# Patient Record
Sex: Female | Born: 1985 | Race: Black or African American | Hispanic: No | Marital: Married | Smoking: Light tobacco smoker
Health system: Southern US, Community
[De-identification: ages and names within clinical notes are randomized; demographics above are authoritative.]

## PROBLEM LIST (undated history)

## (undated) ENCOUNTER — Inpatient Hospital Stay (HOSPITAL_COMMUNITY): Payer: Self-pay

## (undated) ENCOUNTER — Inpatient Hospital Stay: Payer: Self-pay

## (undated) ENCOUNTER — Emergency Department (HOSPITAL_COMMUNITY): Admission: EM | Payer: PRIVATE HEALTH INSURANCE | Source: Home / Self Care

## (undated) DIAGNOSIS — O09299 Supervision of pregnancy with other poor reproductive or obstetric history, unspecified trimester: Secondary | ICD-10-CM

## (undated) DIAGNOSIS — L732 Hidradenitis suppurativa: Secondary | ICD-10-CM

## (undated) DIAGNOSIS — Z87442 Personal history of urinary calculi: Secondary | ICD-10-CM

## (undated) DIAGNOSIS — S92909A Unspecified fracture of unspecified foot, initial encounter for closed fracture: Secondary | ICD-10-CM

## (undated) DIAGNOSIS — N390 Urinary tract infection, site not specified: Secondary | ICD-10-CM

## (undated) DIAGNOSIS — Z9289 Personal history of other medical treatment: Secondary | ICD-10-CM

## (undated) DIAGNOSIS — I10 Essential (primary) hypertension: Secondary | ICD-10-CM

## (undated) DIAGNOSIS — F32A Depression, unspecified: Secondary | ICD-10-CM

## (undated) DIAGNOSIS — F329 Major depressive disorder, single episode, unspecified: Secondary | ICD-10-CM

## (undated) DIAGNOSIS — R55 Syncope and collapse: Secondary | ICD-10-CM

## (undated) DIAGNOSIS — D649 Anemia, unspecified: Secondary | ICD-10-CM

## (undated) DIAGNOSIS — G5603 Carpal tunnel syndrome, bilateral upper limbs: Secondary | ICD-10-CM

## (undated) DIAGNOSIS — N189 Chronic kidney disease, unspecified: Secondary | ICD-10-CM

## (undated) HISTORY — PX: OTHER SURGICAL HISTORY: SHX169

## (undated) HISTORY — PX: WISDOM TOOTH EXTRACTION: SHX21

## (undated) HISTORY — PX: NO PAST SURGERIES: SHX2092

## (undated) HISTORY — DX: Unspecified fracture of unspecified foot, initial encounter for closed fracture: S92.909A

## (undated) SURGERY — Surgical Case
Anesthesia: *Unknown

---
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
## 2007-02-05 ENCOUNTER — Emergency Department: Payer: Self-pay | Admitting: Emergency Medicine

## 2007-02-11 ENCOUNTER — Emergency Department: Payer: Self-pay | Admitting: Emergency Medicine

## 2007-03-10 ENCOUNTER — Emergency Department: Payer: Self-pay | Admitting: Internal Medicine

## 2007-03-11 ENCOUNTER — Emergency Department: Payer: Self-pay

## 2007-05-10 ENCOUNTER — Emergency Department: Payer: Self-pay | Admitting: Unknown Physician Specialty

## 2007-10-04 ENCOUNTER — Emergency Department: Payer: Self-pay | Admitting: Emergency Medicine

## 2008-12-09 ENCOUNTER — Emergency Department: Payer: Self-pay | Admitting: Emergency Medicine

## 2009-03-21 ENCOUNTER — Emergency Department: Payer: Self-pay | Admitting: Emergency Medicine

## 2009-05-09 ENCOUNTER — Emergency Department: Payer: Self-pay | Admitting: Emergency Medicine

## 2009-06-18 ENCOUNTER — Emergency Department: Payer: Self-pay | Admitting: Emergency Medicine

## 2010-03-02 IMAGING — US ABDOMEN ULTRASOUND
1 series · 17 of 25 positions shown · non-contrast
Comparison: none

REASON FOR EXAM: RUQ pain, pregnancy
COMMENTS:

PROCEDURE:     US  - US ABDOMEN GENERAL SURVEY  - June 19, 2009  [DATE]
RESULT:     Comparison: None
TECHNIQUE: Multiple gray-scale and color-flow Doppler images of the abdomen
are presented for review.

[Series 1: abdomen ultrasound · 17 of 56 slices shown]
[im 1/56]
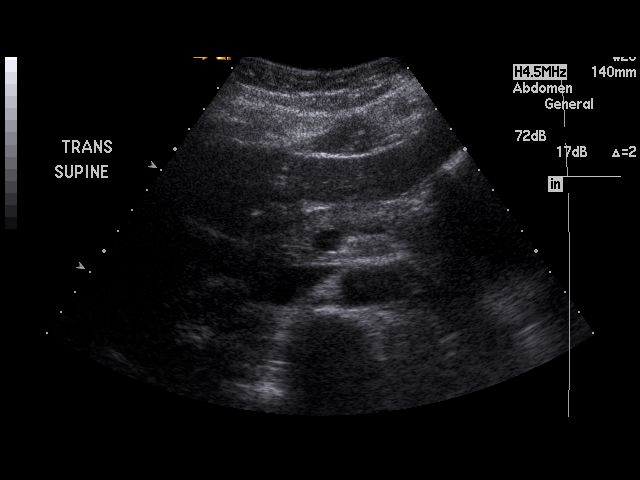
[im 5/56]
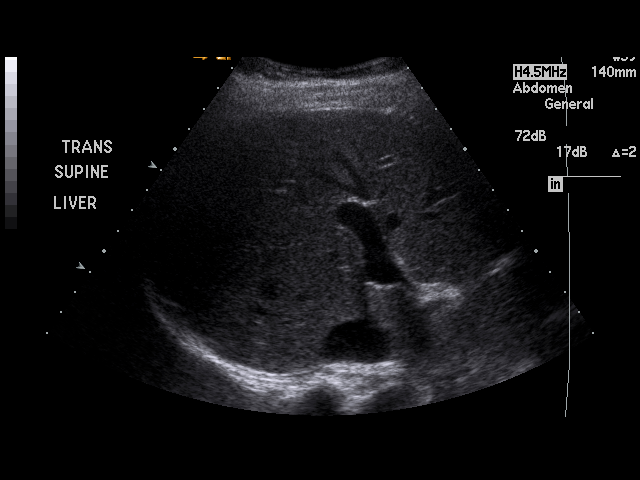
[im 7/56]
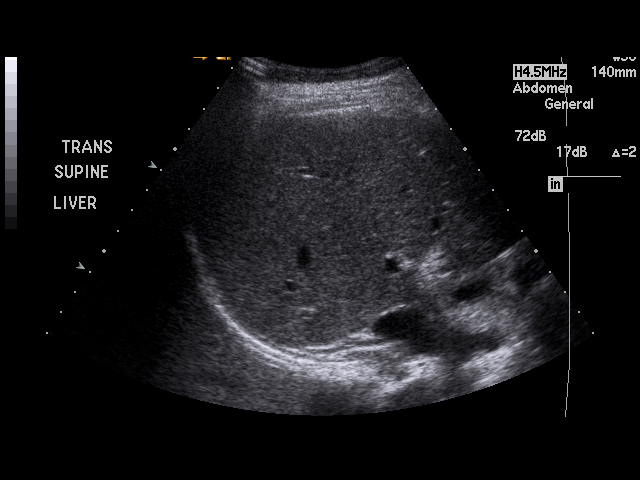
[im 12/56]
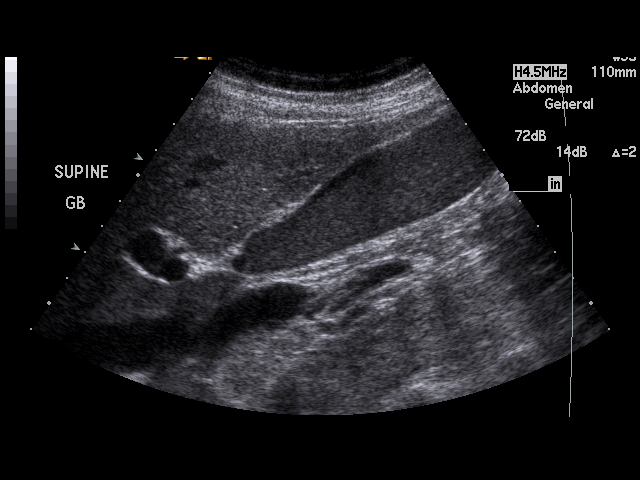
[im 14/56]
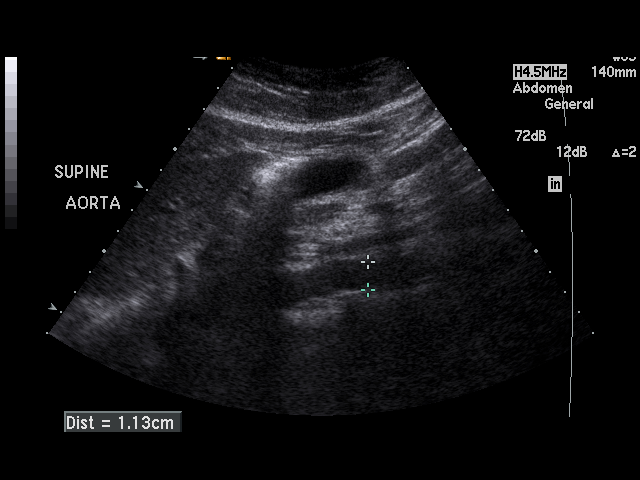
[im 19/56]
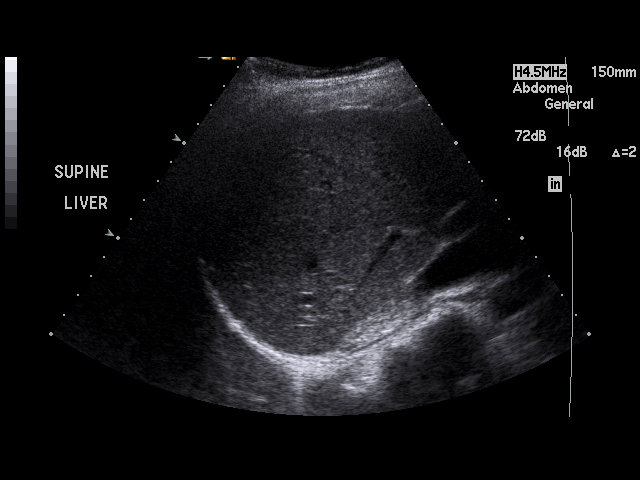
[im 21/56]
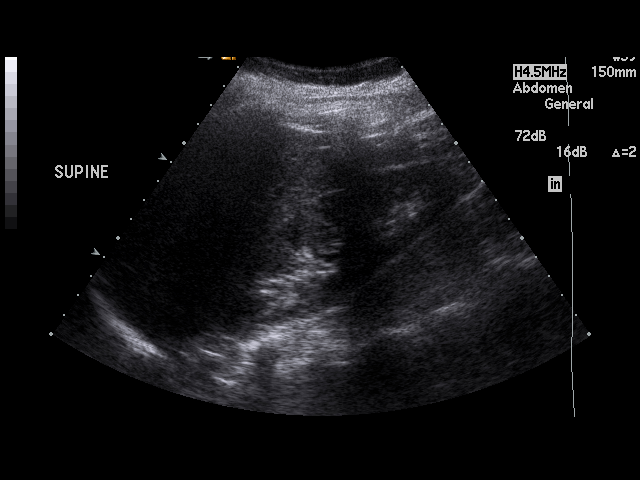
[im 26/56]
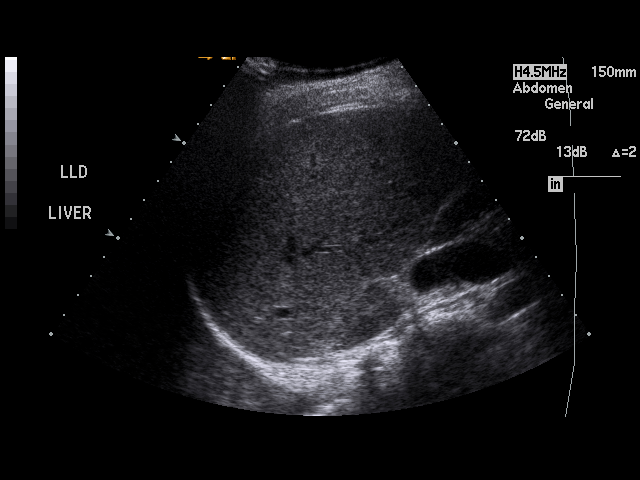
[im 28/56]
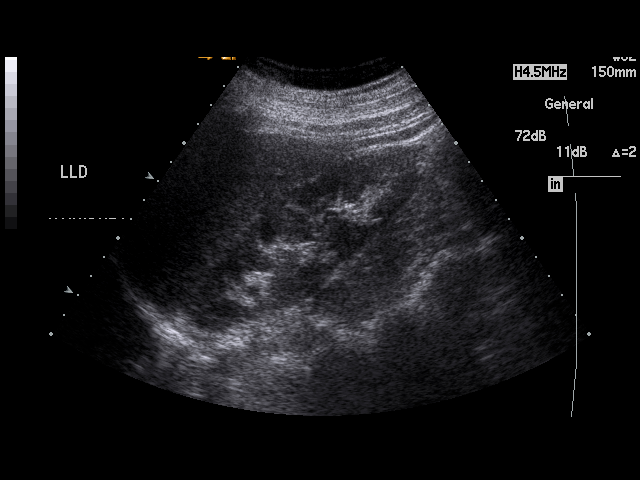
[im 30/56]
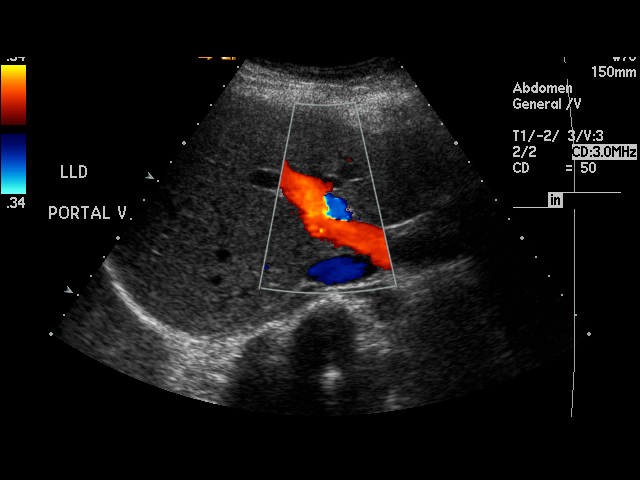
[im 35/56]
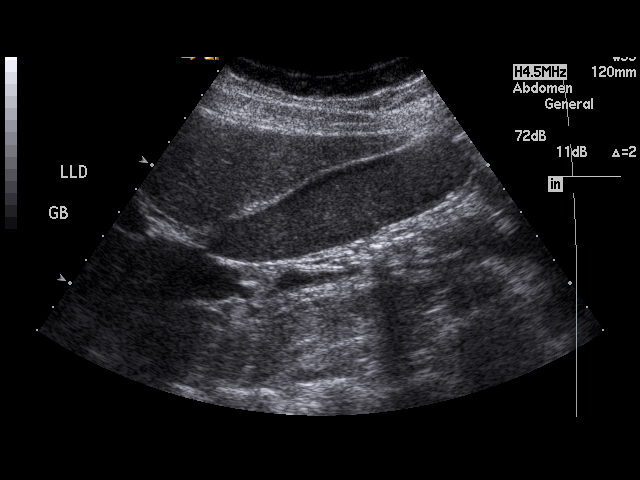
[im 37/56]
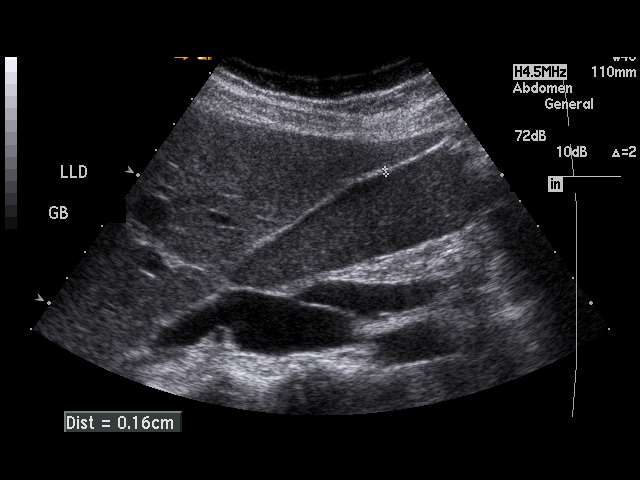
[im 42/56]
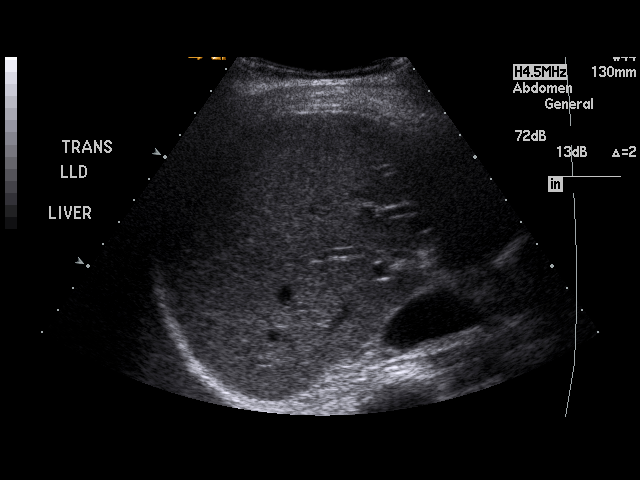
[im 44/56]
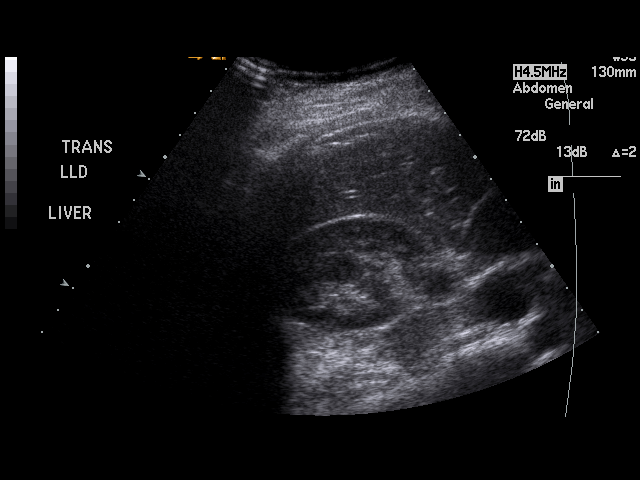
[im 49/56]
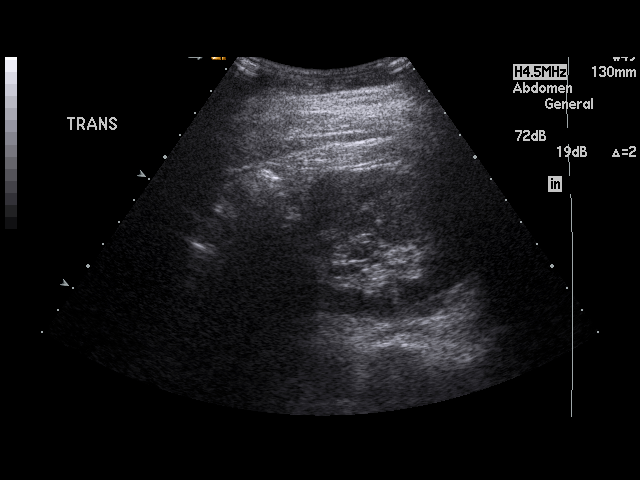
[im 51/56]
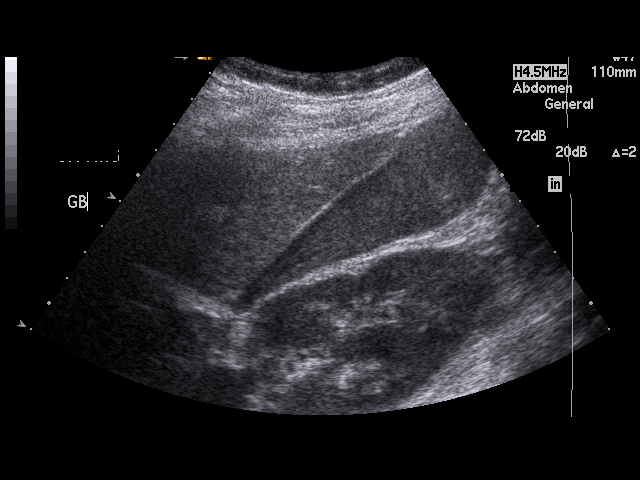
[im 56/56]
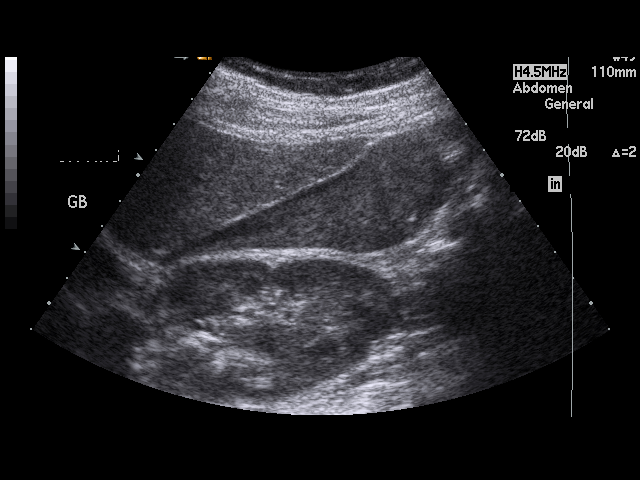

[17 of 25 positions shown; findings below may reference images not displayed]

FINDINGS: Visualized portions of the liver demonstrate normal echogenicity and normal
contours. The liver is without evidence of a focal hepatic lesion.

There is hyperechoic material within the gallbladder likely representing
sludge. There are tiny hyperechoic foci which likely represent
cholelithiasis. There is no intra- or extrahepatic biliary ductal
dilatation. The common duct measures 3 mm in maximal diameter. There is no
gallbladder wall thickening, pericholecystic fluid, or sonographic Murphy's
sign.

The visualized portion of the pancreas is normal in echogenicity. The spleen
is unremarkable. Bilateral kidneys are normal in echogenicity and size. The
right kidney measures 11.8 cm. The left kidney measures 11.4 cm. There are
no renal calculi or hydronephrosis. The abdominal aorta and IVC are
unremarkable.
IMPRESSION: Tiny cholelithiasis and gallbladder sludge without sonographic evidence of
acute cholecystitis.

## 2010-05-27 ENCOUNTER — Emergency Department: Payer: Self-pay | Admitting: Emergency Medicine

## 2010-11-27 ENCOUNTER — Emergency Department: Payer: Self-pay | Admitting: Internal Medicine

## 2011-05-25 ENCOUNTER — Emergency Department: Payer: Self-pay | Admitting: Unknown Physician Specialty

## 2011-05-30 ENCOUNTER — Emergency Department (HOSPITAL_COMMUNITY)
Admission: EM | Admit: 2011-05-30 | Discharge: 2011-05-30 | Discharge status: Against Medical Advice | Payer: Self-pay | Attending: Emergency Medicine | Admitting: Emergency Medicine

## 2011-05-30 ENCOUNTER — Encounter: Payer: Self-pay | Admitting: *Deleted

## 2011-05-30 ENCOUNTER — Emergency Department: Payer: Self-pay | Admitting: Emergency Medicine

## 2011-05-30 DIAGNOSIS — O9989 Other specified diseases and conditions complicating pregnancy, childbirth and the puerperium: Secondary | ICD-10-CM | POA: Insufficient documentation

## 2011-05-30 DIAGNOSIS — M79609 Pain in unspecified limb: Secondary | ICD-10-CM | POA: Insufficient documentation

## 2011-05-30 NOTE — ED Notes (Signed)
The pt has boils under her rt arm for 4 days.  Severe pain tonight.  She is [redacted] weeks pregnant.  lmp [id number]

## 2011-05-30 NOTE — ED Notes (Signed)
The pt signed out ama.  She had waited too long to see a doctor

## 2011-06-30 NOTE — L&D Delivery Note (Signed)
Delivery Note At 11:40 PM a viable female was delivered via Vaginal, Spontaneous Delivery (Presentation: ; Occiput Anterior).  APGAR: 8, 8; weight 6 lb 3 oz (2807 g).   Placenta status: Intact, Spontaneous.  Cord: 3 vessels with the following complications: None.    Anesthesia: None  Episiotomy: None Lacerations: None Suture Repair: n/a Est. Blood Loss (mL): 200  Mom to postpartum.  Baby to nursery-stable.  CRESENZO-DISHMAN,Robyn Williams 11/05/2011, 12:06 AM

## 2011-10-23 ENCOUNTER — Inpatient Hospital Stay (HOSPITAL_COMMUNITY)
Admission: AD | Admit: 2011-10-23 | Discharge: 2011-10-23 | Disposition: A | Discharge status: Home / Self Care | Payer: Self-pay | Source: Ambulatory Visit | Attending: Obstetrics & Gynecology | Admitting: Obstetrics & Gynecology

## 2011-10-23 ENCOUNTER — Encounter (HOSPITAL_COMMUNITY): Payer: Self-pay | Admitting: *Deleted

## 2011-10-23 ENCOUNTER — Inpatient Hospital Stay (HOSPITAL_COMMUNITY): Payer: Self-pay

## 2011-10-23 DIAGNOSIS — N390 Urinary tract infection, site not specified: Secondary | ICD-10-CM

## 2011-10-23 DIAGNOSIS — O2340 Unspecified infection of urinary tract in pregnancy, unspecified trimester: Secondary | ICD-10-CM

## 2011-10-23 DIAGNOSIS — O4700 False labor before 37 completed weeks of gestation, unspecified trimester: Secondary | ICD-10-CM | POA: Insufficient documentation

## 2011-10-23 DIAGNOSIS — O0930 Supervision of pregnancy with insufficient antenatal care, unspecified trimester: Secondary | ICD-10-CM | POA: Insufficient documentation

## 2011-10-23 DIAGNOSIS — O2390 Unspecified genitourinary tract infection in pregnancy, unspecified trimester: Secondary | ICD-10-CM

## 2011-10-23 HISTORY — DX: Urinary tract infection, site not specified: N39.0

## 2011-10-23 HISTORY — DX: Anemia, unspecified: D64.9

## 2011-10-23 HISTORY — DX: Chronic kidney disease, unspecified: N18.9

## 2011-10-23 HISTORY — DX: Depression, unspecified: F32.A

## 2011-10-23 HISTORY — DX: Major depressive disorder, single episode, unspecified: F32.9

## 2011-10-23 LAB — RAPID URINE DRUG SCREEN, HOSP PERFORMED
Amphetamines: NOT DETECTED
Barbiturates: NOT DETECTED
Benzodiazepines: NOT DETECTED
Cocaine: NOT DETECTED

## 2011-10-23 LAB — CBC
HCT: 29.8 % — ABNORMAL LOW (ref 36.0–46.0)
MCH: 31.9 pg (ref 26.0–34.0)
MCHC: 32.6 g/dL (ref 30.0–36.0)
MCV: 98 fL (ref 78.0–100.0)
Platelets: 245 10*3/uL (ref 150–400)
RDW: 13 % (ref 11.5–15.5)

## 2011-10-23 LAB — DIFFERENTIAL
Basophils Absolute: 0.1 10*3/uL (ref 0.0–0.1)
Eosinophils Absolute: 0.1 10*3/uL (ref 0.0–0.7)
Eosinophils Relative: 1 % (ref 0–5)
Lymphocytes Relative: 39 % (ref 12–46)
Monocytes Absolute: 0.6 10*3/uL (ref 0.1–1.0)

## 2011-10-23 LAB — WET PREP, GENITAL: Yeast Wet Prep HPF POC: NONE SEEN

## 2011-10-23 LAB — HEPATITIS B SURFACE ANTIGEN: Hepatitis B Surface Ag: NEGATIVE

## 2011-10-23 LAB — RAPID HIV SCREEN (WH-MAU): Rapid HIV Screen: NONREACTIVE

## 2011-10-23 LAB — ABO/RH: ABO/RH(D): A POS

## 2011-10-23 LAB — URINALYSIS, ROUTINE W REFLEX MICROSCOPIC
Bilirubin Urine: NEGATIVE
Glucose, UA: NEGATIVE mg/dL
Ketones, ur: 15 mg/dL — AB
Nitrite: POSITIVE — AB
Protein, ur: NEGATIVE mg/dL
pH: 7 (ref 5.0–8.0)

## 2011-10-23 LAB — URINE MICROSCOPIC-ADD ON

## 2011-10-23 LAB — TYPE AND SCREEN: ABO/RH(D): A POS

## 2011-10-23 MED ORDER — CEPHALEXIN 250 MG/5ML PO SUSR: 500.0000 mg | Freq: Four times a day (QID) | ORAL | Status: DC

## 2011-10-23 NOTE — MAU Note (Signed)
Received care at [facility]/ Dr Madaline Guthrie

## 2011-10-23 NOTE — MAU Note (Signed)
Started having ctx's yesterday, now 4-6 min, not as bad as last night.  Getting PNC in "[place]", delivered other children at [facility], unsure where delivering this one. No care since Feb, baby has ? Problem with kidneys-last [place] was Feb 10.

## 2011-10-23 NOTE — MAU Provider Note (Signed)
History     CSN: [id number]  Arrival date and time: 10/23/11 1532   First Provider Initiated Contact with Patient 10/23/11 1621      Chief Complaint  Patient presents with  . Labor Eval   HPI This is a 26 y.o. female at [redacted]w[redacted]d who presents with complaint of contractions. Denies leaking or bleeding. Limited PNC in [place], s/p move from [place] to here. No records, will request  OB History    Grav Para Term Preterm Abortions TAB SAB Ect Mult Living   5 3 3  0 1 0 1 0 0 3      Past Medical History  Diagnosis Date  . 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 Anemia   . Asthma     with pregnancy  . Chronic kidney disease     kidney stones  . Urinary tract infection   . Depression     doing good    Past Surgical History  Procedure Date  . No past surgeries     Family History  Problem Relation Age of Onset  . Anesthesia problems Neg Hx     History  Substance Use Topics  . Smoking status: Current Some Day Smoker -- 2 years    Types: Cigarettes  . Smokeless tobacco: Never Used  . Alcohol Use: No    Allergies:  Allergies  Allergen Reactions  . Peanut-Containing Drug Products Hives    No prescriptions prior to admission    ROS As listed in HPI  Physical Exam   Blood pressure 113/80, pulse 92, temperature 97.6 F (36.4 C), resp. rate 20, last menstrual period 02/25/2011, unknown if currently breastfeeding.  Physical Exam  Constitutional: She is oriented to person, place, and time. She appears well-developed and well-nourished. No distress.  HENT:  Head: Normocephalic.  Cardiovascular: Normal rate.   Respiratory: Effort normal.  GI: Soft. She exhibits no distension and no mass. There is no tenderness. There is no rebound and no guarding.  Genitourinary: Vagina normal and uterus normal. No vaginal discharge found.  Musculoskeletal: Normal range of motion.  Neurological: She is alert and oriented to person, place, and time.  Skin: Skin is warm and dry.    Psychiatric: She has a normal mood and affect.  FHR reactive with uterine irritability  Dilation: 1.5 Effacement (%): 50 Cervical Position: Posterior Station: -2 Presentation: Vertex Exam by:: Artelia Laroche CNM No change after 2 hrs.  Results for orders placed during the hospital encounter of 10/23/11 (from the past 24 hour(s))  WET PREP, GENITAL     Status: Abnormal   Collection Time   10/23/11  4:25 PM      Component Value Range   Yeast Wet Prep HPF POC NONE SEEN  NONE SEEN    Trich, Wet Prep NONE SEEN  NONE SEEN    Clue Cells Wet Prep HPF POC NONE SEEN  NONE SEEN    WBC, Wet Prep HPF POC FEW (*) NONE SEEN   CBC     Status: Abnormal   Collection Time   10/23/11  5:40 PM      Component Value Range   WBC 9.2  4.0 - 10.5 (K/uL)   RBC 3.04 (*) 3.87 - 5.11 (MIL/uL)   Hemoglobin 9.7 (*) 12.0 - 15.0 (g/dL)   HCT 56.2 (*) 13.0 - 46.0 (%)   MCV 98.0  78.0 - 100.0 (fL)   MCH 31.9  26.0 - 34.0 (pg)   MCHC 32.6  30.0 - 36.0 (  g/dL)   RDW 16.1  09.6 - 04.5 (%)   Platelets 245  150 - 400 (K/uL)  DIFFERENTIAL     Status: Normal   Collection Time   10/23/11  5:40 PM      Component Value Range   Neutrophils Relative 54  43 - 77 (%)   Neutro Abs 5.0  1.7 - 7.7 (K/uL)   Lymphocytes Relative 39  12 - 46 (%)   Lymphs Abs 3.5  0.7 - 4.0 (K/uL)   Monocytes Relative 6  3 - 12 (%)   Monocytes Absolute 0.6  0.1 - 1.0 (K/uL)   Eosinophils Relative 1  0 - 5 (%)   Eosinophils Absolute 0.1  0.0 - 0.7 (K/uL)   Basophils Relative 1  0 - 1 (%)   Basophils Absolute 0.1  0.0 - 0.1 (K/uL)  RAPID HIV SCREEN ([facility])     Status: Normal   Collection Time   10/23/11  5:40 PM      Component Value Range   SUDS Rapid HIV Screen NON REACTIVE  NON REACTIVE   URINALYSIS, ROUTINE W REFLEX MICROSCOPIC     Status: Abnormal   Collection Time   10/23/11  5:50 PM      Component Value Range   Color, Urine AMBER (*) YELLOW    APPearance CLOUDY (*) CLEAR    Specific Gravity, Urine 1.015  1.005 - 1.030    pH 7.0  5.0  - 8.0    Glucose, UA NEGATIVE  NEGATIVE (mg/dL)   Hgb urine dipstick TRACE (*) NEGATIVE    Bilirubin Urine NEGATIVE  NEGATIVE    Ketones, ur 15 (*) NEGATIVE (mg/dL)   Protein, ur NEGATIVE  NEGATIVE (mg/dL)   Urobilinogen, UA 2.0 (*) 0.0 - 1.0 (mg/dL)   Nitrite POSITIVE (*) NEGATIVE    Leukocytes, UA LARGE (*) NEGATIVE   URINE MICROSCOPIC-ADD ON     Status: Abnormal   Collection Time   10/23/11  5:50 PM      Component Value Range   Squamous Epithelial / LPF MANY (*) RARE    WBC, UA 21-50  <3 (WBC/hpf)   Bacteria, UA MANY (*) RARE    [place]:  Size = Dates, Right Fetal Pyelectasis at 8.6 mm, Placenta Anterior    MAU Course  Procedures  Assessment and Plan  A:  SIUP at [redacted]w[redacted]d      Limited [facility]      Uterine irritability probably related to UTI (+nitrites)      Fetal Renal pyelectasis on Right P:  DIscharge      Rx Keflex     Urine to culture      Schedule into LR clinic and staff here in MAU will Request Records on Monday         [facility] 10/23/2011, 4:30 PM

## 2011-10-23 NOTE — Discharge Instructions (Signed)
Normal Labor and Delivery Your caregiver must first be sure you are in labor. Signs of labor include:  You may pass what is called "the mucus plug" before labor begins. This is a small amount of blood stained mucus.   Regular uterine contractions.   The time between contractions get closer together.   The discomfort and pain gradually gets more intense.   Pains are mostly located in the back.   Pains get worse when walking.   The cervix (the opening of the uterus becomes thinner (begins to efface) and opens up (dilates).  Once you are in labor and admitted into the hospital or care center, your caregiver will do the following:  A complete physical examination.   Check your vital signs (blood pressure, pulse, temperature and the fetal heart rate).   Do a vaginal examination (using a sterile glove and lubricant) to determine:   The position (presentation) of the baby (head [vertex] or buttock first).   The level (station) of the baby's head in the birth canal.   The effacement and dilatation of the cervix.   You may have your pubic hair shaved and be given an enema depending on your caregiver and the circumstance.   An electronic monitor is usually placed on your abdomen. The monitor follows the length and intensity of the contractions, as well as the baby's heart rate.   Usually, your caregiver will insert an IV in your arm with a bottle of sugar water. This is done as a precaution so that medications can be given to you quickly during labor or delivery.  NORMAL LABOR AND DELIVERY IS DIVIDED UP INTO 3 STAGES: First Stage This is when regular contractions begin and the cervix begins to efface and dilate. This stage can last from 3 to 15 hours. The end of the first stage is when the cervix is 100% effaced and 10 centimeters dilated. Pain medications may be given by   Injection (morphine, demerol, etc.)   Regional anesthesia (spinal, caudal or epidural, anesthetics given in  different locations of the spine). Paracervical pain medication may be given, which is an injection of and anesthetic on each side of the cervix.  A pregnant woman may request to have "Natural Childbirth" which is not to have any medications or anesthesia during her labor and delivery. Second Stage This is when the baby comes down through the birth canal (vagina) and is born. This can take 1 to 4 hours. As the baby's head comes down through the birth canal, you may feel like you are going to have a bowel movement. You will get the urge to bear down and push until the baby is delivered. As the baby's head is being delivered, the caregiver will decide if an episiotomy (a cut in the perineum and vagina area) is needed to prevent tearing of the tissue in this area. The episiotomy is sewn up after the delivery of the baby and placenta. Sometimes a mask with nitrous oxide is given for the mother to breath during the delivery of the baby to help if there is too much pain. The end of Stage 2 is when the baby is fully delivered. Then when the umbilical cord stops pulsating it is clamped and cut. Third Stage The third stage begins after the baby is completely delivered and ends after the placenta (afterbirth) is delivered. This usually takes 5 to 30 minutes. After the placenta is delivered, a medication is given either by intravenous or injection to help contract   the uterus and prevent bleeding. The third stage is not painful and pain medication is usually not necessary. If an episiotomy was done, it is repaired at this time. After the delivery, the mother is watched and monitored closely for 1 to 2 hours to make sure there is no postpartum bleeding (hemorrhage). If there is a lot of bleeding, medication is given to contract the uterus and stop the bleeding. Document Released: 03/24/2008 Document Revised: 06/04/2011 Document Reviewed: 03/24/2008 [facility] Patient Information 2012 [place], [place].   Asymptomatic  Bacteriuria, Female Your urine study shows bacteria in your urine. You do not have the usual symptoms of burning or frequent urination. This is why it is called asymptomatic. You may need treatment with antibiotics. Treatment is especially important if you are pregnant. Sometimes this condition can progress to a more severe bladder or kidney infection. Symptoms include burning when urinating, back pain, fever, nausea, or vomiting. Take your antibiotics as directed. Finish them even if you start to feel better. Drink enough water and fluids to keep your urine clear or pale yellow. Go to the bathroom more frequently to keep your bladder empty. Keep the area around the vagina and rectum clean. Wipe yourself from front to back after urinating. Call your caregiver to arrange for follow-up care.  SEEK IMMEDIATE MEDICAL CARE IF:  You develop repeated vomiting.   You develop severe back or abdominal pain.   You have abnormal vaginal discharge or bleeding.   You have blood in the urine.   You develop cramping or abdominal pain.   You have a fever.  If you are pregnant and develop any of the above problems see your caregiver or seek care immediately. Document Released: 06/15/2005 Document Revised: 06/04/2011 Document Reviewed: 05/01/2009 [facility] Patient Information 2012 [place], [place].

## 2011-10-23 NOTE — MAU Provider Note (Signed)
Medical Screening exam and patient care preformed by advanced practice provider.  Agree with the above management.  

## 2011-10-26 ENCOUNTER — Encounter: Payer: Self-pay | Admitting: Obstetrics & Gynecology

## 2011-10-26 DIAGNOSIS — O359XX1 Maternal care for (suspected) fetal abnormality and damage, unspecified, fetus 1: Secondary | ICD-10-CM | POA: Insufficient documentation

## 2011-10-26 LAB — CULTURE, BETA STREP (GROUP B ONLY)

## 2011-10-28 ENCOUNTER — Inpatient Hospital Stay (HOSPITAL_COMMUNITY)
Admission: AD | Admit: 2011-10-28 | Discharge: 2011-10-28 | Disposition: A | Discharge status: Home / Self Care | Payer: Self-pay | Source: Ambulatory Visit | Attending: Obstetrics and Gynecology | Admitting: Obstetrics and Gynecology

## 2011-10-28 ENCOUNTER — Encounter (HOSPITAL_COMMUNITY): Payer: Self-pay

## 2011-10-28 DIAGNOSIS — O4700 False labor before 37 completed weeks of gestation, unspecified trimester: Secondary | ICD-10-CM | POA: Insufficient documentation

## 2011-10-28 DIAGNOSIS — O4703 False labor before 37 completed weeks of gestation, third trimester: Secondary | ICD-10-CM

## 2011-10-28 LAB — URINALYSIS, ROUTINE W REFLEX MICROSCOPIC
Nitrite: NEGATIVE
Specific Gravity, Urine: 1.01 (ref 1.005–1.030)
Urobilinogen, UA: 0.2 mg/dL (ref 0.0–1.0)
pH: 6.5 (ref 5.0–8.0)

## 2011-10-28 LAB — URINE MICROSCOPIC-ADD ON

## 2011-10-28 MED ORDER — LACTATED RINGERS IV BOLUS (SEPSIS)
1000.0000 mL | Freq: Once | INTRAVENOUS | Status: AC
  Administered 2011-10-28: 1000 mL via INTRAVENOUS

## 2011-10-28 MED ORDER — NALBUPHINE SYRINGE 5 MG/0.5 ML
10.0000 mg | INJECTION | Freq: Once | INTRAMUSCULAR | Status: AC
  Administered 2011-10-28: 10 mg via INTRAVENOUS
  Filled 2011-10-28: qty 1

## 2011-10-28 MED ORDER — PROMETHAZINE HCL 25 MG/ML IJ SOLN
25.0000 mg | Freq: Once | INTRAMUSCULAR | Status: AC
  Administered 2011-10-28: 25 mg via INTRAMUSCULAR
  Filled 2011-10-28: qty 1

## 2011-10-28 MED ORDER — ZOLPIDEM TARTRATE 10 MG PO TABS: 10.0000 mg | ORAL_TABLET | Freq: Every evening | ORAL | Status: DC | PRN

## 2011-10-28 MED ORDER — BUTORPHANOL TARTRATE 2 MG/ML IJ SOLN
1.0000 mg | Freq: Once | INTRAMUSCULAR | Status: AC
  Administered 2011-10-28: 1 mg via INTRAMUSCULAR
  Filled 2011-10-28: qty 1

## 2011-10-28 NOTE — MAU Note (Signed)
No adverse effect from stadol or phenergan, rates pain 8/10.

## 2011-10-28 NOTE — MAU Note (Signed)
Pt states came in Friday for labor eval, sent home, ctx's began around 0300, progressively worse around 0500, denies bleeding or lof. +Fm per pt. Being treaded for uti currently. Vomited this am right before coming into MAU. Noted diarrhea yesterday, none today.

## 2011-10-28 NOTE — MAU Note (Signed)
[place] notified pt in MAU for labor eval, ctx's q2-3, no bloody show or lof. Cervix 1.5/50, unsure if vertex per VSmith, CNM. Will come see pt in MAU.

## 2011-10-28 NOTE — MAU Provider Note (Signed)
Chief Complaint:  Labor Eval  First Provider Initiated Contact with Patient 10/28/11 1429    HPI  Robyn Williams is  26 y.o. [id number] at [redacted]w[redacted]d presents with contractions every 2 minutes. Denies leakage of fluid or vaginal bleeding. Good fetal movement.   Pregnancy Course: uncomplicated  Past Medical History: Past Medical History  Diagnosis Date  . 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 Anemia   . Asthma     with pregnancy  . Chronic kidney disease     kidney stones  . Urinary tract infection   . Depression     doing good    Past Surgical History: Past Surgical History  Procedure Date  . No past surgeries     Family History: Family History  Problem Relation Age of Onset  . Anesthesia problems Neg Hx     Social History: History  Substance Use Topics  . Smoking status: Current Some Day Smoker -- 2 years    Types: Cigarettes  . Smokeless tobacco: Never Used  . Alcohol Use: No    Allergies:  Allergies  Allergen Reactions  . Peanut-Containing Drug Products Hives  . Latex Swelling and Rash    Meds:  Prescriptions prior to admission  Medication Sig Dispense Refill  . cephALEXin (KEFLEX) 250 MG/5ML suspension Take 500 mg by mouth 4 (four) times daily. 10 day course      . DISCONTD: cephALEXin (KEFLEX) 250 MG/5ML suspension Take 10 mLs (500 mg total) by mouth 4 (four) times daily.  280 mL  0      Physical Exam  Blood pressure 110/75, pulse 92, temperature 98 F (36.7 C), temperature source Oral, resp. rate 20, height 5\' 5"  (1.651 m), weight 77.565 kg (171 lb), last menstrual period 02/25/2011, SpO2 100.00%, unknown if currently breastfeeding. GENERAL: Well-developed, severe distress.  HEENT: normocephalic, good dentition HEART: normal rate RESP: normal effort ABDOMEN: Soft, nontender, nondistended, gravid.  EXTREMITIES: Nontender, no edema NEURO: alert and oriented  SPECULUM EXAM: Dilation: 1.5 Effacement (%): 50 Cervical Position: Posterior Presentation:  Vertex Exam by:: VSmith, CNM confirmed vertex presentation  FHT:  Baseline 140 , moderate variability, accelerations present, no decelerations Contractions: q 2-5 mins, moderate    Labs: Results for orders placed during the hospital encounter of 10/28/11 (from the past 24 hour(s))  URINALYSIS, ROUTINE W REFLEX MICROSCOPIC     Status: Abnormal   Collection Time   10/28/11  8:30 AM      Component Value Range   Color, Urine YELLOW  YELLOW    APPearance CLOUDY (*) CLEAR    Specific Gravity, Urine 1.010  1.005 - 1.030    pH 6.5  5.0 - 8.0    Glucose, UA NEGATIVE  NEGATIVE (mg/dL)   Hgb urine dipstick SMALL (*) NEGATIVE    Bilirubin Urine NEGATIVE  NEGATIVE    Ketones, ur 15 (*) NEGATIVE (mg/dL)   Protein, ur NEGATIVE  NEGATIVE (mg/dL)   Urobilinogen, UA 0.2  0.0 - 1.0 (mg/dL)   Nitrite NEGATIVE  NEGATIVE    Leukocytes, UA LARGE (*) NEGATIVE   URINE MICROSCOPIC-ADD ON     Status: Abnormal   Collection Time   10/28/11  8:30 AM      Component Value Range   Squamous Epithelial / LPF MANY (*) RARE    WBC, UA TOO NUMEROUS TO COUNT  <3 (WBC/hpf)   RBC / HPF 3-6  <3 (RBC/hpf)   Bacteria, UA FEW (*) RARE    Imaging:   Dilation: 3 Effacement (%):  50 Cervical Position: Posterior Station: Ballotable Presentation: Vertex Exam by:: VSmith, CNM confirmed vertex presentation  Pain improved w/ stadol and phenergan, but worsening again.  Good pain relief w/ Nubain and IV fluids.  1430: Dilation: 3 Effacement (%): 50 Cervical Position: Posterior Station: Ballotable Presentation: Vertex Exam by:: Ivonne Andrew , CNM  Minimal cervical change over 6 hours. Pt uncomfortable again, tearful. Requesting AROM. States she "never dilates past three w/out AROM". Lengthy discussion about importance of avoiding iatrogenic prematurity. R/B/I for augmentation.  Assessment: 1. False labor before 37 completed weeks of gestation in third trimester    Plan: D/C home Follow-up Information    Follow up with  [facility] on 11/04/2011.   Contact information:   [address] [place] [address]       Follow up with [facility]. (As needed if symptoms worsen)    Contact information:   [address] [place] [address] [phone]       Comfort measures Labor precautions, [facility] March of Dimes handout given "Why at least 39 weeks is best for your baby"  Dorathy Kinsman 10/28/2011 1:24 PM

## 2011-10-29 NOTE — MAU Provider Note (Signed)
Agree with above note.  Robyn Williams 10/29/2011 1:40 PM

## 2011-10-30 ENCOUNTER — Encounter (HOSPITAL_COMMUNITY): Payer: Self-pay | Admitting: *Deleted

## 2011-10-30 ENCOUNTER — Inpatient Hospital Stay (HOSPITAL_COMMUNITY)
Admission: AD | Admit: 2011-10-30 | Discharge: 2011-10-30 | Disposition: A | Discharge status: Home / Self Care | Payer: Medicaid Other | Source: Ambulatory Visit | Attending: Obstetrics & Gynecology | Admitting: Obstetrics & Gynecology

## 2011-10-30 DIAGNOSIS — O4700 False labor before 37 completed weeks of gestation, unspecified trimester: Secondary | ICD-10-CM | POA: Insufficient documentation

## 2011-10-30 DIAGNOSIS — O479 False labor, unspecified: Secondary | ICD-10-CM

## 2011-10-30 MED ORDER — NALBUPHINE HCL 10 MG/ML IJ SOLN
10.0000 mg | INTRAMUSCULAR | Status: AC
  Administered 2011-10-30: 10 mg via INTRAVENOUS
  Filled 2011-10-30: qty 1

## 2011-10-30 MED ORDER — HYDROXYZINE HCL 50 MG PO TABS
50.0000 mg | ORAL_TABLET | ORAL | Status: AC
  Administered 2011-10-30: 50 mg via ORAL
  Filled 2011-10-30: qty 1

## 2011-10-30 MED ORDER — ONDANSETRON HCL 4 MG/2ML IJ SOLN
4.0000 mg | INTRAMUSCULAR | Status: AC
  Administered 2011-10-30: 4 mg via INTRAVENOUS
  Filled 2011-10-30: qty 2

## 2011-10-30 MED ORDER — GI COCKTAIL ~~LOC~~
30.0000 mL | ORAL | Status: AC
  Administered 2011-10-30: 30 mL via ORAL
  Filled 2011-10-30: qty 30

## 2011-10-30 MED ORDER — PROMETHAZINE HCL 12.5 MG PO TABS: 12.5000 mg | ORAL_TABLET | Freq: Four times a day (QID) | ORAL | Status: DC | PRN

## 2011-10-30 MED ORDER — LACTATED RINGERS IV BOLUS (SEPSIS)
1000.0000 mL | Freq: Once | INTRAVENOUS | Status: AC
  Administered 2011-10-30: 1000 mL via INTRAVENOUS

## 2011-10-30 MED ORDER — MORPHINE SULFATE 4 MG/ML IJ SOLN
4.0000 mg | INTRAMUSCULAR | Status: AC
  Administered 2011-10-30: 4 mg via INTRAVENOUS
  Filled 2011-10-30: qty 1

## 2011-10-30 NOTE — MAU Note (Signed)
Discussed prodromal labor in possible dysfunctional labor due to position of baby.  Reinforced different positioning in bed and hands/knees position to try to rotate baby.  This may help with labor and general relief of back pain. Stressed diet and fluid intake.

## 2011-10-30 NOTE — Discharge Instructions (Signed)
Normal Labor and Delivery Your caregiver must first be sure you are in labor. Signs of labor include:  You may pass what is called "the mucus plug" before labor begins. This is a small amount of blood stained mucus.   Regular uterine contractions.   The time between contractions get closer together.   The discomfort and pain gradually gets more intense.   The cervix (the opening of the uterus becomes thinner (begins to efface) and opens up (dilates).  Once you are in labor and admitted into the hospital or care center, your caregiver will do the following:  A complete physical examination.   Check your vital signs (blood pressure, pulse, temperature and the fetal heart rate).   Do a vaginal examination (using a sterile glove and lubricant) to determine:   The position (presentation) of the baby (head [vertex] or buttock first).   The level (station) of the baby's head in the birth canal.   The effacement and dilatation of the cervix.   You may have your pubic hair shaved and be given an enema depending on your caregiver and the circumstance.   An electronic monitor is usually placed on your abdomen. The monitor follows the length and intensity of the contractions, as well as the baby's heart rate.   Usually, your caregiver will insert an IV in your arm with a bottle of sugar water. This is done as a precaution so that medications can be given to you quickly during labor or delivery.  NORMAL LABOR AND DELIVERY IS DIVIDED UP INTO 3 STAGES: First Stage This is when regular contractions begin and the cervix begins to efface and dilate. This stage can last from 3 to 15 hours. The end of the first stage is when the cervix is 100% effaced and 10 centimeters dilated. Pain medications may be given by   Injection (morphine, demerol, etc.)   Regional anesthesia (spinal, caudal or epidural, anesthetics given in different locations of the spine). Paracervical pain medication may be given,  which is an injection of and anesthetic on each side of the cervix.  A pregnant woman may request to have "Natural Childbirth" which is not to have any medications or anesthesia during her labor and delivery. Second Stage This is when the baby comes down through the birth canal (vagina) and is born. This can take 1 to 4 hours. As the baby's head comes down through the birth canal, you may feel like you are going to have a bowel movement. You will get the urge to bear down and push until the baby is delivered. As the baby's head is being delivered, the caregiver will decide if an episiotomy (a cut in the perineum and vagina area) is needed to prevent tearing of the tissue in this area. The episiotomy is sewn up after the delivery of the baby and placenta. Sometimes a mask with nitrous oxide is given for the mother to breath during the delivery of the baby to help if there is too much pain. The end of Stage 2 is when the baby is fully delivered. Then when the umbilical cord stops pulsating it is clamped and cut. Third Stage The third stage begins after the baby is completely delivered and ends after the placenta (afterbirth) is delivered. This usually takes 5 to 30 minutes. After the placenta is delivered, a medication is given either by intravenous or injection to help contract the uterus and prevent bleeding. The third stage is not painful and pain medication is usually  not necessary. If an episiotomy was done, it is repaired at this time. After the delivery, the mother is watched and monitored closely for 1 to 2 hours to make sure there is no postpartum bleeding (hemorrhage). If there is a lot of bleeding, medication is given to contract the uterus and stop the bleeding. Document Released: 03/24/2008 Document Revised: 06/04/2011 Document Reviewed: 03/24/2008 [facility] Patient Information 2012 [place], [place].

## 2011-10-30 NOTE — MAU Provider Note (Signed)
Attestation of Attending Supervision of Advanced Practitioner: Evaluation and management procedures were performed by the OB Fellow/PA/CNM/NP under my supervision and collaboration. Chart reviewed, and agree with management and plan.  Delenn Ahn, M.D. 10/30/2011 7:41 PM   

## 2011-10-30 NOTE — MAU Provider Note (Signed)
Chief Complaint:  Labor eval  First Provider Initiated Contact with Patient 10/30/11 1132      HPI  Robyn Williams is  26 y.o. [id number] at [redacted]w[redacted]d presents with contractions and back pain consistently since her visit to MAU Wednesday.  She is breathing and crying with contractions at this time.  She is also having severe heartburn and n/v and has been unable to keep down her abx prescribed for UTI on  Wednesday.  She reports good fetal movement, denies LOF, vaginal bleeding, vaginal itching/burning, urinary symptoms, h/a, dizziness, n/v, or fever/chills.    Pregnancy Course: uncomplicated  Past Medical History: Past Medical History  Diagnosis Date  . 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 Anemia   . Asthma     with pregnancy  . Chronic kidney disease     kidney stones  . Urinary tract infection   . Depression     doing good    Past Surgical History: Past Surgical History  Procedure Date  . No past surgeries     Family History: Family History  Problem Relation Age of Onset  . Anesthesia problems Neg Hx   . Hearing loss Neg Hx     Social History: History  Substance Use Topics  . Smoking status: Current Some Day Smoker -- 2 years    Types: Cigarettes  . Smokeless tobacco: Never Used  . Alcohol Use: No    Allergies:  Allergies  Allergen Reactions  . Peanut-Containing Drug Products Hives  . Latex Swelling and Rash    Meds:  Prescriptions prior to admission  Medication Sig Dispense Refill  . cephALEXin (KEFLEX) 250 MG/5ML suspension Take 500 mg by mouth 4 (four) times daily. 10 day course. Started 10/23/11.      Marland Kitchen zolpidem (AMBIEN) 10 MG tablet Take 1 tablet (10 mg total) by mouth at bedtime as needed for sleep.  30 tablet  0      Physical Exam  Blood pressure 119/87, pulse 112, temperature 98.5 F (36.9 C), temperature source Oral, resp. rate 20, last menstrual period 02/25/2011, unknown if currently breastfeeding. GENERAL: Well-developed, well-nourished female in no  acute distress.  HEENT: normocephalic, good dentition HEART: normal rate RESP: normal effort ABDOMEN: Soft, nontender, nondistended, gravid.  EXTREMITIES: Nontender, no edema NEURO: alert and oriented  Dilation: 4 Effacement (%): 50 Cervical Position: Posterior Exam by:: Misty Stanley CNM  Cervix rechecked and 4.5/50/posterior  In 4 hours from first cervical check, cervix remains 4.5/50/posterior  FHT:  Baseline 135 , moderate variability, accelerations present, no decelerations Contractions: q 6 mins with significant irritability in between Contractions less frequent and irregular following IV fluids  Labs: Not indicated  Imaging:  Not indicated  Assessment: Threatened labor No cervical change in 4 hours in MAU  Plan: IV LR bolus, IV Zofran, IV Nubain 10 mg, and GI cocktail  Reevaluate cervix      D/C home with labor precautions Morphine 4 mg IV x1 dose and Vistaril 50 mg PO x1 dose in MAU  Phenergan 12.5 mg Q 6 hours for n/v F/U with prenatal provider Return to MAU as needed  LEFTWICH-KIRBY, Jafet Wissing 5/3/201311:46 AM

## 2011-10-31 ENCOUNTER — Inpatient Hospital Stay (HOSPITAL_COMMUNITY)
Admission: AD | Admit: 2011-10-31 | Discharge: 2011-10-31 | Disposition: A | Discharge status: Home / Self Care | Payer: Self-pay | Source: Ambulatory Visit | Attending: Family Medicine | Admitting: Family Medicine

## 2011-10-31 ENCOUNTER — Encounter (HOSPITAL_COMMUNITY): Payer: Self-pay | Admitting: Obstetrics and Gynecology

## 2011-10-31 DIAGNOSIS — O479 False labor, unspecified: Secondary | ICD-10-CM | POA: Insufficient documentation

## 2011-10-31 DIAGNOSIS — O0930 Supervision of pregnancy with insufficient antenatal care, unspecified trimester: Secondary | ICD-10-CM

## 2011-10-31 MED ORDER — BUTORPHANOL TARTRATE 2 MG/ML IJ SOLN
1.0000 mg | Freq: Once | INTRAMUSCULAR | Status: AC
  Administered 2011-10-31: 1 mg via INTRAVENOUS
  Filled 2011-10-31: qty 1

## 2011-10-31 MED ORDER — ACETAMINOPHEN-CODEINE 300-30 MG PO TABS: 1.0000 | ORAL_TABLET | Freq: Four times a day (QID) | ORAL | Status: DC | PRN

## 2011-10-31 NOTE — MAU Note (Signed)
Pt presents to MAU with chief complaint of contractions. Pt is G5P3 at [redacted]w[redacted]d. Pt was here in MAU yesterday and sent home with labor precautions. Pt says she contracted all night long

## 2011-10-31 NOTE — MAU Provider Note (Signed)
  History     CSN: [id number]  Arrival date and time: 10/31/11 1436   None     Chief Complaint  Patient presents with  . Labor Eval   HPI  Pt here with continued contractions.  Pt seen in MAU yesterday and cervix was 4 cm with no change.  Here today reporting continued contractions.  Denies UTI symptoms, taking antibiotics for infection.  Also given IM rocephin on 10/23/11 for UTI.  Received prenatal care early in pregnancy at [facility].  Has not seen provider since Feb.  Denies vaginal bleeding or leaking of fluid.  +fetal movement.  Pt received prenatal at [facility] earlier in pregnancy.    Past Medical History  Diagnosis Date  . 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 Anemia   . Asthma     with pregnancy  . Chronic kidney disease     kidney stones  . Urinary tract infection   . Depression     doing good    Past Surgical History  Procedure Date  . No past surgeries     Family History  Problem Relation Age of Onset  . Anesthesia problems Neg Hx   . Hearing loss Neg Hx     History  Substance Use Topics  . Smoking status: Current Some Day Smoker -- 2 years    Types: Cigarettes  . Smokeless tobacco: Never Used  . Alcohol Use: No    Allergies:  Allergies  Allergen Reactions  . Peanut-Containing Drug Products Hives  . Latex Swelling and Rash    Prescriptions prior to admission  Medication Sig Dispense Refill  . cephALEXin (KEFLEX) 250 MG/5ML suspension Take 500 mg by mouth 4 (four) times daily. 10 day course. Started 10/23/11.      Marland Kitchen promethazine (PHENERGAN) 12.5 MG tablet Take 1 tablet (12.5 mg total) by mouth every 6 (six) hours as needed for nausea.  30 tablet  0  . zolpidem (AMBIEN) 10 MG tablet Take 1 tablet (10 mg total) by mouth at bedtime as needed for sleep.  30 tablet  0    Review of Systems  Gastrointestinal: Positive for abdominal pain.  All other systems reviewed and are negative.   Physical Exam   Blood pressure 123/67, pulse 102, temperature 96.8 F (36  C), temperature source Oral, resp. rate 20, last menstrual period 02/25/2011, unknown if currently breastfeeding.  Physical Exam  Constitutional: She is oriented to person, place, and time. She appears well-developed and well-nourished. No distress (appears slightly uncomfortable).  HENT:  Head: Normocephalic.  Neck: Normal range of motion. Neck supple.  Cardiovascular: Normal rate, regular rhythm and normal heart sounds.   Respiratory: Effort normal and breath sounds normal.  GI: Soft. There is no tenderness.  Genitourinary: No bleeding around the vagina.  Neurological: She is alert and oriented to person, place, and time.  Skin: Skin is warm and dry.   Cervix remains unchanged after 2 hours FHR 140's, +accels, reactive Toco - irregular MAU Course  Procedures  Stadol 1 mg IM > pt reports relief  Assessment and Plan  Braxton Hicks  Plan: DC to home Vistaril PO Keep appointment at [facility] for 11/04/11  [facility] 10/31/2011, 3:58 PM

## 2011-10-31 NOTE — Discharge Instructions (Signed)

## 2011-11-01 NOTE — MAU Provider Note (Signed)
Chart reviewed and agree with management and plan.  

## 2011-11-03 ENCOUNTER — Encounter (HOSPITAL_COMMUNITY): Payer: Self-pay | Admitting: *Deleted

## 2011-11-03 ENCOUNTER — Inpatient Hospital Stay (HOSPITAL_COMMUNITY)
Admission: AD | Admit: 2011-11-03 | Discharge: 2011-11-03 | Disposition: A | Discharge status: Home / Self Care | Payer: Self-pay | Source: Ambulatory Visit | Attending: Family Medicine | Admitting: Family Medicine

## 2011-11-03 DIAGNOSIS — N898 Other specified noninflammatory disorders of vagina: Secondary | ICD-10-CM

## 2011-11-03 DIAGNOSIS — O99891 Other specified diseases and conditions complicating pregnancy: Secondary | ICD-10-CM | POA: Insufficient documentation

## 2011-11-03 DIAGNOSIS — O4700 False labor before 37 completed weeks of gestation, unspecified trimester: Secondary | ICD-10-CM

## 2011-11-03 MED ORDER — NALBUPHINE SYRINGE 5 MG/0.5 ML
5.0000 mg | INJECTION | INTRAMUSCULAR | Status: DC | PRN
  Filled 2011-11-03: qty 0.5

## 2011-11-03 MED ORDER — FENTANYL CITRATE 0.05 MG/ML IJ SOLN
100.0000 ug | Freq: Once | INTRAMUSCULAR | Status: AC
  Administered 2011-11-03: 100 ug via INTRAMUSCULAR

## 2011-11-03 MED ORDER — FENTANYL CITRATE 0.05 MG/ML IJ SOLN
100.0000 ug | Freq: Once | INTRAMUSCULAR | Status: DC
  Filled 2011-11-03: qty 2

## 2011-11-03 MED ORDER — ACETAMINOPHEN-CODEINE #3 300-30 MG PO TABS
1.0000 | ORAL_TABLET | ORAL | Status: DC | PRN
  Administered 2011-11-03: 1 via ORAL
  Filled 2011-11-03: qty 1

## 2011-11-03 NOTE — MAU Provider Note (Signed)
Chart reviewed and agree with management and plan.  

## 2011-11-03 NOTE — Discharge Instructions (Signed)
Pregnancy - Third Trimester The third trimester of pregnancy (the last 3 months) is a period of the most rapid growth for you and your baby. The baby approaches a length of 20 inches and a weight of 6 to 10 pounds. The baby is adding on fat and getting ready for life outside your body. While inside, babies have periods of sleeping and waking, suck their thumbs, and hiccups. You can often feel small contractions of the uterus. This is false labor. It is also called Braxton-Hicks contractions. This is like a practice for labor. The usual problems in this stage of pregnancy include more difficulty breathing, swelling of the hands and feet from water retention, and having to urinate more often because of the uterus and baby pressing on your bladder.  PRENATAL EXAMS  Blood work may continue to be done during prenatal exams. These tests are done to check on your health and the probable health of your baby. Blood work is used to follow your blood levels (hemoglobin). Anemia (low hemoglobin) is common during pregnancy. Iron and vitamins are given to help prevent this. You may also continue to be checked for diabetes. Some of the past blood tests may be done again.   The size of the uterus is measured during each visit. This makes sure your baby is growing properly according to your pregnancy dates.   Your blood pressure is checked every prenatal visit. This is to make sure you are not getting toxemia.   Your urine is checked every prenatal visit for infection, diabetes and protein.   Your weight is checked at each visit. This is done to make sure gains are happening at the suggested rate and that you and your baby are growing normally.   Sometimes, an ultrasound is performed to confirm the position and the proper growth and development of the baby. This is a test done that bounces harmless sound waves off the baby so your caregiver can more accurately determine due dates.   Discuss the type of pain  medication and anesthesia you will have during your labor and delivery.   Discuss the possibility and anesthesia if a Cesarean Section might be necessary.   Inform your caregiver if there is any mental or physical violence at home.  Sometimes, a specialized non-stress test, contraction stress test and biophysical profile are done to make sure the baby is not having a problem. Checking the amniotic fluid surrounding the baby is called an amniocentesis. The amniotic fluid is removed by sticking a needle into the belly (abdomen). This is sometimes done near the end of pregnancy if an early delivery is required. In this case, it is done to help make sure the baby's lungs are mature enough for the baby to live outside of the womb. If the lungs are not mature and it is unsafe to deliver the baby, an injection of cortisone medication is given to the mother 1 to 2 days before the delivery. This helps the baby's lungs mature and makes it safer to deliver the baby. CHANGES OCCURING IN THE THIRD TRIMESTER OF PREGNANCY Your body goes through many changes during pregnancy. They vary from person to person. Talk to your caregiver about changes you notice and are concerned about.  During the last trimester, you have probably had an increase in your appetite. It is normal to have cravings for certain foods. This varies from person to person and pregnancy to pregnancy.   You may begin to get stretch marks on your hips,   abdomen, and breasts. These are normal changes in the body during pregnancy. There are no exercises or medications to take which prevent this change.   Constipation may be treated with a stool softener or adding bulk to your diet. Drinking lots of fluids, fiber in vegetables, fruits, and whole grains are helpful.   Exercising is also helpful. If you have been very active up until your pregnancy, most of these activities can be continued during your pregnancy. If you have been less active, it is helpful  to start an exercise program such as walking. Consult your caregiver before starting exercise programs.   Avoid all smoking, alcohol, un-prescribed drugs, herbs and "street drugs" during your pregnancy. These chemicals affect the formation and growth of the baby. Avoid chemicals throughout the pregnancy to ensure the delivery of a healthy infant.   Backache, varicose veins and hemorrhoids may develop or get worse.   You will tire more easily in the third trimester, which is normal.   The baby's movements may be stronger and more often.   You may become short of breath easily.   Your belly button may stick out.   A yellow discharge may leak from your breasts called colostrum.   You may have a bloody mucus discharge. This usually occurs a few days to a week before labor begins.  HOME CARE INSTRUCTIONS   Keep your caregiver's appointments. Follow your caregiver's instructions regarding medication use, exercise, and diet.   During pregnancy, you are providing food for you and your baby. Continue to eat regular, well-balanced meals. Choose foods such as meat, fish, milk and other low fat dairy products, vegetables, fruits, and whole-grain breads and cereals. Your caregiver will tell you of the ideal weight gain.   A physical sexual relationship may be continued throughout pregnancy if there are no other problems such as early (premature) leaking of amniotic fluid from the membranes, vaginal bleeding, or belly (abdominal) pain.   Exercise regularly if there are no restrictions. Check with your caregiver if you are unsure of the safety of your exercises. Greater weight gain will occur in the last 2 trimesters of pregnancy. Exercising helps:   Control your weight.   Get you in shape for labor and delivery.   You lose weight after you deliver.   Rest a lot with legs elevated, or as needed for leg cramps or low back pain.   Wear a good support or jogging bra for breast tenderness during  pregnancy. This may help if worn during sleep. Pads or tissues may be used in the bra if you are leaking colostrum.   Do not use hot tubs, steam rooms, or saunas.   Wear your seat belt when driving. This protects you and your baby if you are in an accident.   Avoid raw meat, cat litter boxes and soil used by cats. These carry germs that can cause birth defects in the baby.   It is easier to loose urine during pregnancy. Tightening up and strengthening the pelvic muscles will help with this problem. You can practice stopping your urination while you are going to the bathroom. These are the same muscles you need to strengthen. It is also the muscles you would use if you were trying to stop from passing gas. You can practice tightening these muscles up 10 times a set and repeating this about 3 times per day. Once you know what muscles to tighten up, do not perform these exercises during urination. It is more likely   to cause an infection by backing up the urine.   Ask for help if you have financial, counseling or nutritional needs during pregnancy. Your caregiver will be able to offer counseling for these needs as well as refer you for other special needs.   Make a list of emergency phone numbers and have them available.   Plan on getting help from family or friends when you go home from the hospital.   Make a trial run to the hospital.   Take prenatal classes with the father to understand, practice and ask questions about the labor and delivery.   Prepare the baby's room/nursery.   Do not travel out of the city unless it is absolutely necessary and with the advice of your caregiver.   Wear only low or no heal shoes to have better balance and prevent falling.  MEDICATIONS AND DRUG USE IN PREGNANCY  Take prenatal vitamins as directed. The vitamin should contain 1 milligram of folic acid. Keep all vitamins out of reach of children. Only a couple vitamins or tablets containing iron may be fatal  to a baby or young child when ingested.   Avoid use of all medications, including herbs, over-the-counter medications, not prescribed or suggested by your caregiver. Only take over-the-counter or prescription medicines for pain, discomfort, or fever as directed by your caregiver. Do not use aspirin, ibuprofen (Motrin, Advil, Nuprin) or naproxen (Aleve) unless OK'd by your caregiver.   Let your caregiver also know about herbs you may be using.   Alcohol is related to a number of birth defects. This includes fetal alcohol syndrome. All alcohol, in any form, should be avoided completely. Smoking will cause low birth rate and premature babies.   Street/illegal drugs are very harmful to the baby. They are absolutely forbidden. A baby born to an addicted mother will be addicted at birth. The baby will go through the same withdrawal an adult does.  SEEK MEDICAL CARE IF: You have any concerns or worries during your pregnancy. It is better to call with your questions if you feel they cannot wait, rather than worry about them. DECISIONS ABOUT CIRCUMCISION You may or may not know the sex of your baby. If you know your baby is a boy, it may be time to think about circumcision. Circumcision is the removal of the foreskin of the penis. This is the skin that covers the sensitive end of the penis. There is no proven medical need for this. Often this decision is made on what is popular at the time or based upon religious beliefs and social issues. You can discuss these issues with your caregiver or pediatrician. SEEK IMMEDIATE MEDICAL CARE IF:   An unexplained oral temperature above 102 F (38.9 C) develops, or as your caregiver suggests.   You have leaking of fluid from the vagina (birth canal). If leaking membranes are suspected, take your temperature and tell your caregiver of this when you call.   There is vaginal spotting, bleeding or passing clots. Tell your caregiver of the amount and how many pads are  used.   You develop a bad smelling vaginal discharge with a change in the color from clear to white.   You develop vomiting that lasts more than 24 hours.   You develop chills or fever.   You develop shortness of breath.   You develop burning on urination.   You loose more than 2 pounds of weight or gain more than 2 pounds of weight or as suggested by your   caregiver.   You notice sudden swelling of your face, hands, and feet or legs.   You develop belly (abdominal) pain. Round ligament discomfort is a common non-cancerous (benign) cause of abdominal pain in pregnancy. Your caregiver still must evaluate you.   You develop a severe headache that does not go away.   You develop visual problems, blurred or double vision.   If you have not felt your baby move for more than 1 hour. If you think the baby is not moving as much as usual, eat something with sugar in it and lie down on your left side for an hour. The baby should move at least 4 to 5 times per hour. Call right away if your baby moves less than that.   You fall, are in a car accident or any kind of trauma.   There is mental or physical violence at home.  Document Released: 06/09/2001 Document Revised: 06/04/2011 Document Reviewed: 12/12/2008 [employer] Patient Information 2012 [employer].Fetal Movement Counts Patient Name: __________________________________________________ Patient Due Date: ____________________ Kick counts is highly recommended in high risk pregnancies, but it is a good idea for every pregnant woman to do. Start counting fetal movements at 28 weeks of the pregnancy. Fetal movements increase after eating a full meal or eating or drinking something sweet (the blood sugar is higher). It is also important to drink plenty of fluids (well hydrated) before doing the count. Lie on your left side because it helps with the circulation or you can sit in a comfortable chair with your arms over your belly (abdomen) with no  distractions around you. DOING THE COUNT  Try to do the count the same time of day each time you do it.   Mark the day and time, then see how long it takes for you to feel 10 movements (kicks, flutters, swishes, rolls). You should have at least 10 movements within 2 hours. You will most likely feel 10 movements in much less than 2 hours. If you do not, wait an hour and count again. After a couple of days you will see a pattern.   What you are looking for is a change in the pattern or not enough counts in 2 hours. Is it taking longer in time to reach 10 movements?  SEEK MEDICAL CARE IF:  You feel less than 10 counts in 2 hours. Tried twice.   No movement in one hour.   The pattern is changing or taking longer each day to reach 10 counts in 2 hours.   You feel the baby is not moving as it usually does.  Date: ____________ Movements: ____________ Start time: ____________ Finish time: ____________  Date: ____________ Movements: ____________ Start time: ____________ Finish time: ____________ Date: ____________ Movements: ____________ Start time: ____________ Finish time: ____________ Date: ____________ Movements: ____________ Start time: ____________ Finish time: ____________ Date: ____________ Movements: ____________ Start time: ____________ Finish time: ____________ Date: ____________ Movements: ____________ Start time: ____________ Finish time: ____________ Date: ____________ Movements: ____________ Start time: ____________ Finish time: ____________ Date: ____________ Movements: ____________ Start time: ____________ Finish time: ____________  Date: ____________ Movements: ____________ Start time: ____________ Finish time: ____________ Date: ____________ Movements: ____________ Start time: ____________ Finish time: ____________ Date: ____________ Movements: ____________ Start time: ____________ Finish time: ____________ Date: ____________ Movements: ____________ Start time: ____________  Finish time: ____________ Date: ____________ Movements: ____________ Start time: ____________ Finish time: ____________ Date: ____________ Movements: ____________ Start time: ____________ Finish time: ____________ Date: ____________ Movements: ____________ Start time: ____________ Finish time: ____________    Date: ____________ Movements: ____________ Start time: ____________ Finish time: ____________ Date: ____________ Movements: ____________ Start time: ____________ Finish time: ____________ Date: ____________ Movements: ____________ Start time: ____________ Finish time: ____________ Date: ____________ Movements: ____________ Start time: ____________ Finish time: ____________ Date: ____________ Movements: ____________ Start time: ____________ Finish time: ____________ Date: ____________ Movements: ____________ Start time: ____________ Finish time: ____________ Date: ____________ Movements: ____________ Start time: ____________ Finish time: ____________  Date: ____________ Movements: ____________ Start time: ____________ Finish time: ____________ Date: ____________ Movements: ____________ Start time: ____________ Finish time: ____________ Date: ____________ Movements: ____________ Start time: ____________ Finish time: ____________ Date: ____________ Movements: ____________ Start time: ____________ Finish time: ____________ Date: ____________ Movements: ____________ Start time: ____________ Finish time: ____________ Date: ____________ Movements: ____________ Start time: ____________ Finish time: ____________ Date: ____________ Movements: ____________ Start time: ____________ Finish time: ____________  Date: ____________ Movements: ____________ Start time: ____________ Finish time: ____________ Date: ____________ Movements: ____________ Start time: ____________ Finish time: ____________ Date: ____________ Movements: ____________ Start time: ____________ Finish time: ____________ Date: ____________  Movements: ____________ Start time: ____________ Finish time: ____________ Date: ____________ Movements: ____________ Start time: ____________ Finish time: ____________ Date: ____________ Movements: ____________ Start time: ____________ Finish time: ____________ Date: ____________ Movements: ____________ Start time: ____________ Finish time: ____________  Date: ____________ Movements: ____________ Start time: ____________ Finish time: ____________ Date: ____________ Movements: ____________ Start time: ____________ Finish time: ____________ Date: ____________ Movements: ____________ Start time: ____________ Finish time: ____________ Date: ____________ Movements: ____________ Start time: ____________ Finish time: ____________ Date: ____________ Movements: ____________ Start time: ____________ Finish time: ____________ Date: ____________ Movements: ____________ Start time: ____________ Finish time: ____________ Date: ____________ Movements: ____________ Start time: ____________ Finish time: ____________  Date: ____________ Movements: ____________ Start time: ____________ Finish time: ____________ Date: ____________ Movements: ____________ Start time: ____________ Finish time: ____________ Date: ____________ Movements: ____________ Start time: ____________ Finish time: ____________ Date: ____________ Movements: ____________ Start time: ____________ Finish time: ____________ Date: ____________ Movements: ____________ Start time: ____________ Finish time: ____________ Date: ____________ Movements: ____________ Start time: ____________ Finish time: ____________ Date: ____________ Movements: ____________ Start time: ____________ Finish time: ____________  Date: ____________ Movements: ____________ Start time: ____________ Finish time: ____________ Date: ____________ Movements: ____________ Start time: ____________ Finish time: ____________ Date: ____________ Movements: ____________ Start time:  ____________ Finish time: ____________ Date: ____________ Movements: ____________ Start time: ____________ Finish time: ____________ Date: ____________ Movements: ____________ Start time: ____________ Finish time: ____________ Date: ____________ Movements: ____________ Start time: ____________ Finish time: ____________ Document Released: 07/15/2006 Document Revised: 06/04/2011 Document Reviewed: 01/15/2009 [employer] Patient Information 2012 [employer]. 

## 2011-11-03 NOTE — MAU Provider Note (Signed)
History     CSN: [id number]  Arrival date and time: 11/03/11 5361   First Provider Initiated Contact with Patient 11/03/11 1037      Chief Complaint  Patient presents with  . leaking ? amniotic fluid    HPI Complaining of vaginal discharge which started last night at 23:00 when pt was sitting watching a movie. Discharge was enough to soak underwear and pants, that night but no more discharge overnight. Pt reports one more bout of clear discharge in the morning when pt went to wipe after going to the bathroom. Reports regular fetal movement and occasional contractions that are described as a flutter feeling. Pt has received no PNC accept two prior visits to the MAU. Denies vaginal bleeding, severe abdominal pain, fever, rash, n/v/d/c, SOB, CP   OB History    Grav Para Term Preterm Abortions TAB SAB Ect Mult Living   5 3 3  0 1 0 1 0 0 3      Past Medical History  Diagnosis Date  . 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 Anemia   . Asthma     with pregnancy  . Chronic kidney disease     kidney stones  . Urinary tract infection   . Depression     doing good    Past Surgical History  Procedure Date  . No past surgeries     Family History  Problem Relation Age of Onset  . Anesthesia problems Neg Hx   . Hearing loss Neg Hx     History  Substance Use Topics  . Smoking status: Current Some Day Smoker -- 2 years    Types: Cigarettes  . Smokeless tobacco: Never Used  . Alcohol Use: No    Allergies:  Allergies  Allergen Reactions  . Peanut-Containing Drug Products Hives  . Latex Swelling and Rash    Prescriptions prior to admission  Medication Sig Dispense Refill  . Acetaminophen-Codeine (TYLENOL/CODEINE #3) 300-30 MG per tablet Take 1 tablet by mouth every 6 (six) hours as needed for pain.  20 tablet  0  . cephALEXin (KEFLEX) 250 MG/5ML suspension Take 250 mg by mouth 4 (four) times daily. Pt finished a seven day course of medication on or about 10/31/11.      Marland Kitchen  promethazine (PHENERGAN) 12.5 MG tablet Take 1 tablet (12.5 mg total) by mouth every 6 (six) hours as needed for nausea.  30 tablet  0  . zolpidem (AMBIEN) 10 MG tablet Take 1 tablet (10 mg total) by mouth at bedtime as needed for sleep.  30 tablet  0    ROS Per HPI  Physical Exam   Blood pressure 118/84, pulse 103, temperature 97.7 F (36.5 C), temperature source Oral, resp. rate 20, height 5\' 5"  (1.651 m), weight 77.565 kg (171 lb), last menstrual period 02/25/2011.  Physical Exam  Constitutional: She is oriented to person, place, and time. She appears well-developed and well-nourished.  HENT:  Head: Normocephalic.  Eyes: EOM are normal.  Neck: Normal range of motion.  Cardiovascular: Normal rate and regular rhythm.   Respiratory: Effort normal.  GI: Soft. There is no tenderness. There is no rebound and no guarding.  Genitourinary: Uterus normal. Vaginal discharge (milky fluid noted int he vaginal vault) found.  Musculoskeletal: Normal range of motion.  Neurological: She is alert and oriented to person, place, and time.  Skin: Skin is warm.   Dilation: 4 Effacement (%): 50 Cervical Position: Posterior Station: Ballotable;-2 Presentation: Vertex Exam by:: Dr  Merrell    MAU Course  Procedures  MDM Wet prep + for yeast Amnisure negative Slide negative for ferning  Cervix w/o change after greater than 5 hours of observation.  Fentanyl x1  Assessment and Plan  26yo [redacted]w[redacted]d [id number] w/ intact membranes and not in active labor. - DC home w/ labor precautions - Reasons for return reviewed - Kick count documents provided - F/U w/ OB PRN.  - Tylenol w/ codeine PRN at home for pain.  MERRELL, DAVID, MD 11/03/2011, 11:45 AM    Results for orders placed during the hospital encounter of 11/03/11 (from the past 24 hour(s))  WET PREP, GENITAL     Status: Abnormal   Collection Time   11/03/11 10:10 AM      Component Value Range   Yeast Wet Prep HPF POC NONE SEEN  NONE  SEEN    Trich, Wet Prep NONE SEEN  NONE SEEN    Clue Cells Wet Prep HPF POC NONE SEEN  NONE SEEN    WBC, Wet Prep HPF POC MANY (*) NONE SEEN   AMNISURE RUPTURE OF MEMBRANE (ROM)     Status: Normal   Collection Time   11/03/11 10:40 AM      Component Value Range   Amnisure ROM NEGATIVE     I have seen and examined this patient and I agree with the above. Cam Hai 7:52 PM 11/03/2011

## 2011-11-03 NOTE — MAU Note (Signed)
Leaking fluid last night, none today. No bleeding. Having contractions.

## 2011-11-03 NOTE — MAU Note (Signed)
Pt in c/o leaking of fluid.  States she noticed a clear, watery-like discharge last night around 2345.  States it continued off and on since last night, had subsided by this morning.  States she noticed a yellow fluid leaking this morning when she got into mau room.  Denies any bleeding.  Reports occasional ucs that started this am.  + FM.

## 2011-11-04 ENCOUNTER — Encounter (HOSPITAL_COMMUNITY): Payer: Self-pay | Admitting: *Deleted

## 2011-11-04 ENCOUNTER — Inpatient Hospital Stay (HOSPITAL_COMMUNITY)
Admission: AD | Admit: 2011-11-04 | Discharge: 2011-11-06 | DRG: 775 | Disposition: A | Discharge status: Home / Self Care | Payer: PRIVATE HEALTH INSURANCE | Source: Ambulatory Visit | Attending: Obstetrics & Gynecology | Admitting: Obstetrics & Gynecology

## 2011-11-04 ENCOUNTER — Ambulatory Visit (INDEPENDENT_AMBULATORY_CARE_PROVIDER_SITE_OTHER): Payer: Self-pay | Admitting: Advanced Practice Midwife

## 2011-11-04 VITALS — BP 133/87 | Temp 97.4°F | Wt 168.6 lb

## 2011-11-04 DIAGNOSIS — O2390 Unspecified genitourinary tract infection in pregnancy, unspecified trimester: Secondary | ICD-10-CM

## 2011-11-04 DIAGNOSIS — O2340 Unspecified infection of urinary tract in pregnancy, unspecified trimester: Secondary | ICD-10-CM

## 2011-11-04 DIAGNOSIS — N390 Urinary tract infection, site not specified: Secondary | ICD-10-CM

## 2011-11-04 DIAGNOSIS — O0930 Supervision of pregnancy with insufficient antenatal care, unspecified trimester: Secondary | ICD-10-CM

## 2011-11-04 DIAGNOSIS — R8271 Bacteriuria: Secondary | ICD-10-CM

## 2011-11-04 DIAGNOSIS — O9989 Other specified diseases and conditions complicating pregnancy, childbirth and the puerperium: Secondary | ICD-10-CM

## 2011-11-04 LAB — CBC
Hemoglobin: 10.3 g/dL — ABNORMAL LOW (ref 12.0–15.0)
MCH: 31.8 pg (ref 26.0–34.0)
Platelets: 287 10*3/uL (ref 150–400)
RBC: 3.24 MIL/uL — ABNORMAL LOW (ref 3.87–5.11)
WBC: 14.7 10*3/uL — ABNORMAL HIGH (ref 4.0–10.5)

## 2011-11-04 LAB — POCT URINALYSIS DIP (DEVICE)
Glucose, UA: NEGATIVE mg/dL
Nitrite: NEGATIVE
Urobilinogen, UA: >=8 mg/dL (ref 0.0–1.0)

## 2011-11-04 MED ORDER — OXYTOCIN 20 UNITS IN LACTATED RINGERS INFUSION - SIMPLE
125.0000 mL/h | Freq: Once | INTRAVENOUS | Status: AC
  Administered 2011-11-04: 125 mL/h via INTRAVENOUS

## 2011-11-04 MED ORDER — PHENYLEPHRINE 40 MCG/ML (10ML) SYRINGE FOR IV PUSH (FOR BLOOD PRESSURE SUPPORT): 80.0000 ug | PREFILLED_SYRINGE | INTRAVENOUS | Status: DC | PRN

## 2011-11-04 MED ORDER — ONDANSETRON 8 MG PO TBDP: 8.0000 mg | ORAL_TABLET | Freq: Three times a day (TID) | ORAL | Status: DC | PRN

## 2011-11-04 MED ORDER — NITROFURANTOIN MONOHYD MACRO 100 MG PO CAPS: 100.0000 mg | ORAL_CAPSULE | Freq: Two times a day (BID) | ORAL | Status: DC

## 2011-11-04 MED ORDER — ONDANSETRON HCL 4 MG/2ML IJ SOLN: 4.0000 mg | Freq: Four times a day (QID) | INTRAMUSCULAR | Status: DC | PRN

## 2011-11-04 MED ORDER — LACTATED RINGERS IV SOLN: INTRAVENOUS | Status: DC

## 2011-11-04 MED ORDER — HYDROXYZINE HCL 50 MG PO TABS: 50.0000 mg | ORAL_TABLET | Freq: Four times a day (QID) | ORAL | Status: DC | PRN

## 2011-11-04 MED ORDER — OXYTOCIN BOLUS FROM INFUSION
500.0000 mL | Freq: Once | INTRAVENOUS | Status: DC
  Filled 2011-11-04: qty 500
  Filled 2011-11-04: qty 1000

## 2011-11-04 MED ORDER — CITRIC ACID-SODIUM CITRATE 334-500 MG/5ML PO SOLN: 30.0000 mL | ORAL | Status: DC | PRN

## 2011-11-04 MED ORDER — EPHEDRINE 5 MG/ML INJ: 10.0000 mg | INTRAVENOUS | Status: DC | PRN

## 2011-11-04 MED ORDER — ACETAMINOPHEN 325 MG PO TABS: 650.0000 mg | ORAL_TABLET | ORAL | Status: DC | PRN

## 2011-11-04 MED ORDER — HYDROXYZINE HCL 50 MG/ML IM SOLN
50.0000 mg | Freq: Four times a day (QID) | INTRAMUSCULAR | Status: DC | PRN
  Filled 2011-11-04: qty 1

## 2011-11-04 MED ORDER — FLEET ENEMA 7-19 GM/118ML RE ENEM: 1.0000 | ENEMA | RECTAL | Status: DC | PRN

## 2011-11-04 MED ORDER — NITROFURANTOIN MONOHYD MACRO 100 MG PO CAPS: 100.0000 mg | ORAL_CAPSULE | Freq: Two times a day (BID) | ORAL | Status: AC

## 2011-11-04 MED ORDER — OXYCODONE-ACETAMINOPHEN 5-325 MG PO TABS
1.0000 | ORAL_TABLET | ORAL | Status: DC | PRN
  Administered 2011-11-05: 2 via ORAL
  Filled 2011-11-04: qty 2

## 2011-11-04 MED ORDER — LACTATED RINGERS IV SOLN: 500.0000 mL | INTRAVENOUS | Status: DC | PRN

## 2011-11-04 MED ORDER — DIPHENHYDRAMINE HCL 50 MG/ML IJ SOLN: 12.5000 mg | INTRAMUSCULAR | Status: DC | PRN

## 2011-11-04 MED ORDER — LACTATED RINGERS IV SOLN: 500.0000 mL | Freq: Once | INTRAVENOUS | Status: DC

## 2011-11-04 MED ORDER — IBUPROFEN 600 MG PO TABS
600.0000 mg | ORAL_TABLET | Freq: Four times a day (QID) | ORAL | Status: DC | PRN
  Administered 2011-11-05: 600 mg via ORAL
  Filled 2011-11-04: qty 1

## 2011-11-04 MED ORDER — LIDOCAINE HCL (PF) 1 % IJ SOLN
30.0000 mL | INTRAMUSCULAR | Status: DC | PRN
  Filled 2011-11-04: qty 30

## 2011-11-04 MED ORDER — FENTANYL 2.5 MCG/ML BUPIVACAINE 1/10 % EPIDURAL INFUSION (WH - ANES): 14.0000 mL/h | INTRAMUSCULAR | Status: DC

## 2011-11-04 NOTE — Progress Notes (Signed)
Pulse 96 Pt was not able to drink glucola in appropriate time frame. Felt very nauseated.

## 2011-11-04 NOTE — Progress Notes (Signed)
   Subjective:    Robyn Williams is a [id number] [redacted]w[redacted]d being seen today for her first obstetrical visit.  Her obstetrical history is significant for late to care. Patient does not intend to breast feed. Pregnancy history fully reviewed.  Patient reports contractions since yesterday. No LOF or VB. Good FM. R/O ROM in [facility] yesterday. Cervix 4 cm.   Filed Vitals:   11/04/11 0856  BP: 133/87  Temp: 97.4 F (36.3 C)  Weight: 168 lb 9.6 oz (76.476 kg)    HISTORY: OB History    Grav Para Term Preterm Abortions TAB SAB Ect Mult Living   5 3 3  0 1 0 1 0 0 3     # Outc Date GA Lbr Len/2nd Wgt Sex Del Anes PTL Lv   1 TRM 12/04    M SVD EPI No Yes   2 TRM 7/07    F SVD EPI No Yes   3 TRM 7/11    F SVD EPI No Yes   4 SAB            5 CUR              Past Medical History  Diagnosis Date  . 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 Anemia   . Asthma     with pregnancy  . Chronic kidney disease     kidney stones  . Urinary tract infection   . Depression     doing good   Past Surgical History  Procedure Date  . No past surgeries    Family History  Problem Relation Age of Onset  . Anesthesia problems Neg Hx   . Hearing loss Neg Hx      Exam    Uterus:     Pelvic Exam:    Perineum: Normal Perineum   Vulva: normal   Vagina:  normal mucosa, erythema   pH: NA   Cervix: posterior 3+/50/-3, soft   Adnexa: normal adnexa   Bony Pelvis: average  System: Breast:  normal appearance, no masses or tenderness, deferred   Skin: normal coloration and turgor, no rashes    Neurologic: oriented, normal, normal mood, gait normal; reflexes normal and symmetric   Extremities: normal strength, tone, and muscle mass   HEENT PERRLA, sclera clear, anicteric and thyroid without masses   Mouth/Teeth dental hygiene good   Neck supple   Cardiovascular: regular rate and rhythm   Respiratory:  appears well, vitals normal, no respiratory distress, acyanotic, normal RR, chest clear, no wheezing, crepitations,  rhonchi, normal symmetric air entry   Abdomen: soft, non-tender; bowel sounds normal; no masses,  no organomegaly. Gravid S=D   Urinary: urethral meatus normal      Assessment:    Pregnancy: [id number] Patient Active Problem List  Diagnoses  . fetal pyelectaisis--rt  . Insufficient prenatal care        Plan:     Initial labs reviewed. Unable to keep down glucola. Will Rx Zofran and repeat at [place]. Has prenatal vitamins. Problem list reviewed and updated. Genetic Screening discussed: Too late.  Ultrasound discussed; fetal survey: results reviewed. Pyelectasis. F/U after birth  Follow up in 1 weeks. 50% of 30 min visit spent on counseling and coordination of care.  Desires BLT/ R/B/I discussed including bleeding infection, damage to nearby organs and regret. BTL papers signed. Will plan interval tubal due to late gestation.   Dorathy Kinsman 11/04/2011

## 2011-11-04 NOTE — Patient Instructions (Signed)
Pregnancy - Third Trimester The third trimester of pregnancy (the last 3 months) is a period of the most rapid growth for you and your baby. The baby approaches a length of 20 inches and a weight of 6 to 10 pounds. The baby is adding on fat and getting ready for life outside your body. While inside, babies have periods of sleeping and waking, suck their thumbs, and hiccups. You can often feel small contractions of the uterus. This is false labor. It is also called Braxton-Hicks contractions. This is like a practice for labor. The usual problems in this stage of pregnancy include more difficulty breathing, swelling of the hands and feet from water retention, and having to urinate more often because of the uterus and baby pressing on your bladder.  PRENATAL EXAMS  Blood work may continue to be done during prenatal exams. These tests are done to check on your health and the probable health of your baby. Blood work is used to follow your blood levels (hemoglobin). Anemia (low hemoglobin) is common during pregnancy. Iron and vitamins are given to help prevent this. You may also continue to be checked for diabetes. Some of the past blood tests may be done again.   The size of the uterus is measured during each visit. This makes sure your baby is growing properly according to your pregnancy dates.   Your blood pressure is checked every prenatal visit. This is to make sure you are not getting toxemia.   Your urine is checked every prenatal visit for infection, diabetes and protein.   Your weight is checked at each visit. This is done to make sure gains are happening at the suggested rate and that you and your baby are growing normally.   Sometimes, an ultrasound is performed to confirm the position and the proper growth and development of the baby. This is a test done that bounces harmless sound waves off the baby so your caregiver can more accurately determine due dates.   Discuss the type of pain  medication and anesthesia you will have during your labor and delivery.   Discuss the possibility and anesthesia if a Cesarean Section might be necessary.   Inform your caregiver if there is any mental or physical violence at home.  Sometimes, a specialized non-stress test, contraction stress test and biophysical profile are done to make sure the baby is not having a problem. Checking the amniotic fluid surrounding the baby is called an amniocentesis. The amniotic fluid is removed by sticking a needle into the belly (abdomen). This is sometimes done near the end of pregnancy if an early delivery is required. In this case, it is done to help make sure the baby's lungs are mature enough for the baby to live outside of the womb. If the lungs are not mature and it is unsafe to deliver the baby, an injection of cortisone medication is given to the mother 1 to 2 days before the delivery. This helps the baby's lungs mature and makes it safer to deliver the baby. CHANGES OCCURING IN THE THIRD TRIMESTER OF PREGNANCY Your body goes through many changes during pregnancy. They vary from person to person. Talk to your caregiver about changes you notice and are concerned about.  During the last trimester, you have probably had an increase in your appetite. It is normal to have cravings for certain foods. This varies from person to person and pregnancy to pregnancy.   You may begin to get stretch marks on your hips,   abdomen, and breasts. These are normal changes in the body during pregnancy. There are no exercises or medications to take which prevent this change.   Constipation may be treated with a stool softener or adding bulk to your diet. Drinking lots of fluids, fiber in vegetables, fruits, and whole grains are helpful.   Exercising is also helpful. If you have been very active up until your pregnancy, most of these activities can be continued during your pregnancy. If you have been less active, it is helpful  to start an exercise program such as walking. Consult your caregiver before starting exercise programs.   Avoid all smoking, alcohol, un-prescribed drugs, herbs and "street drugs" during your pregnancy. These chemicals affect the formation and growth of the baby. Avoid chemicals throughout the pregnancy to ensure the delivery of a healthy infant.   Backache, varicose veins and hemorrhoids may develop or get worse.   You will tire more easily in the third trimester, which is normal.   The baby's movements may be stronger and more often.   You may become short of breath easily.   Your belly button may stick out.   A yellow discharge may leak from your breasts called colostrum.   You may have a bloody mucus discharge. This usually occurs a few days to a week before labor begins.  HOME CARE INSTRUCTIONS   Keep your caregiver's appointments. Follow your caregiver's instructions regarding medication use, exercise, and diet.   During pregnancy, you are providing food for you and your baby. Continue to eat regular, well-balanced meals. Choose foods such as meat, fish, milk and other low fat dairy products, vegetables, fruits, and whole-grain breads and cereals. Your caregiver will tell you of the ideal weight gain.   A physical sexual relationship may be continued throughout pregnancy if there are no other problems such as early (premature) leaking of amniotic fluid from the membranes, vaginal bleeding, or belly (abdominal) pain.   Exercise regularly if there are no restrictions. Check with your caregiver if you are unsure of the safety of your exercises. Greater weight gain will occur in the last 2 trimesters of pregnancy. Exercising helps:   Control your weight.   Get you in shape for labor and delivery.   You lose weight after you deliver.   Rest a lot with legs elevated, or as needed for leg cramps or low back pain.   Wear a good support or jogging bra for breast tenderness during  pregnancy. This may help if worn during sleep. Pads or tissues may be used in the bra if you are leaking colostrum.   Do not use hot tubs, steam rooms, or saunas.   Wear your seat belt when driving. This protects you and your baby if you are in an accident.   Avoid raw meat, cat litter boxes and soil used by cats. These carry germs that can cause birth defects in the baby.   It is easier to loose urine during pregnancy. Tightening up and strengthening the pelvic muscles will help with this problem. You can practice stopping your urination while you are going to the bathroom. These are the same muscles you need to strengthen. It is also the muscles you would use if you were trying to stop from passing gas. You can practice tightening these muscles up 10 times a set and repeating this about 3 times per day. Once you know what muscles to tighten up, do not perform these exercises during urination. It is more likely   to cause an infection by backing up the urine.   Ask for help if you have financial, counseling or nutritional needs during pregnancy. Your caregiver will be able to offer counseling for these needs as well as refer you for other special needs.   Make a list of emergency phone numbers and have them available.   Plan on getting help from family or friends when you go home from the hospital.   Make a trial run to the hospital.   Take prenatal classes with the father to understand, practice and ask questions about the labor and delivery.   Prepare the baby's room/nursery.   Do not travel out of the city unless it is absolutely necessary and with the advice of your caregiver.   Wear only low or no heal shoes to have better balance and prevent falling.  MEDICATIONS AND DRUG USE IN PREGNANCY  Take prenatal vitamins as directed. The vitamin should contain 1 milligram of folic acid. Keep all vitamins out of reach of children. Only a couple vitamins or tablets containing iron may be fatal  to a baby or young child when ingested.   Avoid use of all medications, including herbs, over-the-counter medications, not prescribed or suggested by your caregiver. Only take over-the-counter or prescription medicines for pain, discomfort, or fever as directed by your caregiver. Do not use aspirin, ibuprofen (Motrin, Advil, Nuprin) or naproxen (Aleve) unless OK'd by your caregiver.   Let your caregiver also know about herbs you may be using.   Alcohol is related to a number of birth defects. This includes fetal alcohol syndrome. All alcohol, in any form, should be avoided completely. Smoking will cause low birth rate and premature babies.   Street/illegal drugs are very harmful to the baby. They are absolutely forbidden. A baby born to an addicted mother will be addicted at birth. The baby will go through the same withdrawal an adult does.  SEEK MEDICAL CARE IF: You have any concerns or worries during your pregnancy. It is better to call with your questions if you feel they cannot wait, rather than worry about them. DECISIONS ABOUT CIRCUMCISION You may or may not know the sex of your baby. If you know your baby is a boy, it may be time to think about circumcision. Circumcision is the removal of the foreskin of the penis. This is the skin that covers the sensitive end of the penis. There is no proven medical need for this. Often this decision is made on what is popular at the time or based upon religious beliefs and social issues. You can discuss these issues with your caregiver or pediatrician. SEEK IMMEDIATE MEDICAL CARE IF:   An unexplained oral temperature above 102 F (38.9 C) develops, or as your caregiver suggests.   You have leaking of fluid from the vagina (birth canal). If leaking membranes are suspected, take your temperature and tell your caregiver of this when you call.   There is vaginal spotting, bleeding or passing clots. Tell your caregiver of the amount and how many pads are  used.   You develop a bad smelling vaginal discharge with a change in the color from clear to white.   You develop vomiting that lasts more than 24 hours.   You develop chills or fever.   You develop shortness of breath.   You develop burning on urination.   You loose more than 2 pounds of weight or gain more than 2 pounds of weight or as suggested by your   caregiver.   You notice sudden swelling of your face, hands, and feet or legs.   You develop belly (abdominal) pain. Round ligament discomfort is a common non-cancerous (benign) cause of abdominal pain in pregnancy. Your caregiver still must evaluate you.   You develop a severe headache that does not go away.   You develop visual problems, blurred or double vision.   If you have not felt your baby move for more than 1 hour. If you think the baby is not moving as much as usual, eat something with sugar in it and lie down on your left side for an hour. The baby should move at least 4 to 5 times per hour. Call right away if your baby moves less than that.   You fall, are in a car accident or any kind of trauma.   There is mental or physical violence at home.  Document Released: 06/09/2001 Document Revised: 06/04/2011 Document Reviewed: 12/12/2008 Exit[employer] Patient Information 2012 [employer]. 

## 2011-11-04 NOTE — Progress Notes (Signed)
Addended by: Jill Side on: 11/04/2011 01:30 PM   Modules accepted: Orders

## 2011-11-04 NOTE — H&P (Signed)
Robyn Williams is a 26 y.o. female [phone] with IUP at [redacted]w[redacted]d presenting for active labor. Pt states she has been having regular, every 2 minutes contractions, associated with spotting vaginal bleeding, membranes are intact, ruptured, clear fluid, about 1 hour ago, with active fetal movement.   PNCare at no where, had one visit in [facility] today, a few [facility] visits over the past 2 weeks  Prenatal History/Complications: R Pylectesis  Past Medical History: Past Medical History  Diagnosis Date  . 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 Anemia   . Asthma     with pregnancy  . Chronic kidney disease     kidney stones  . Urinary tract infection   . Depression     doing good    Past Surgical History: Past Surgical History  Procedure Date  . No past surgeries     Obstetrical History: OB History    Grav Para Term Preterm Abortions TAB SAB Ect Mult Living   5 3 3  0 1 0 1 0 0 3      Gynecological History: OB History    Grav Para Term Preterm Abortions TAB SAB Ect Mult Living   5 3 3  0 1 0 1 0 0 3      Social History: History   Social History  . Marital Status: Married    Spouse Name: N/A    Number of Children: N/A  . Years of Education: N/A   Social History Main Topics  . Smoking status: Current Some Day Smoker -- 2 years    Types: Cigarettes  . Smokeless tobacco: Never Used  . Alcohol Use: No  . Drug Use: No  . Sexually Active: Yes   Other Topics Concern  . None   Social History Narrative  . None    Family History: Family History  Problem Relation Age of Onset  . Anesthesia problems Neg Hx   . Hearing loss Neg Hx     Allergies: Allergies  Allergen Reactions  . Peanut-Containing Drug Products Hives  . Latex Swelling and Rash    Prescriptions prior to admission  Medication Sig Dispense Refill  . Acetaminophen-Codeine (TYLENOL/CODEINE #3) 300-30 MG per tablet Take 1 tablet by mouth every 6 (six) hours as needed for pain.  20 tablet  0  . nitrofurantoin,  macrocrystal-monohydrate, (MACROBID) 100 MG capsule Take 1 capsule (100 mg total) by mouth 2 (two) times daily.  14 capsule  0  . ondansetron (ZOFRAN ODT) 8 MG disintegrating tablet Take 1 tablet (8 mg total) by mouth every 8 (eight) hours as needed for nausea.  20 tablet  2  . promethazine (PHENERGAN) 12.5 MG tablet Take 1 tablet (12.5 mg total) by mouth every 6 (six) hours as needed for nausea.  30 tablet  0  . zolpidem (AMBIEN) 10 MG tablet Take 1 tablet (10 mg total) by mouth at bedtime as needed for sleep.  30 tablet  0    Review of Systems - Negative    Last menstrual period 02/25/2011. General appearance: moderate distress Lungs: clear to auscultation bilaterally Heart: regular rate and rhythm Abdomen: soft, non-tender; bowel sounds normal Extremities: Homans sign is negative, no sign of DVT DTR's 2+ Presentation: cephalic CX 5/100/-1 Fetal monitoringBaseline: 130 bpm, Variability: Good {> 6 bpm), Accelerations: Reactive and Decelerations: Early Uterine activitystrong, q    Prenatal labs: ABO, Rh: --/--/A POS (04/26 1740) Antibody: NEG (04/26 1740) Rubella:   RPR: NON REACTIVE (04/26 1740)  HBsAg: NEGATIVE (04/26  1740)  HIV: Non-reactive (04/26 0000)  GBS:    1 hr Glucola not done (pt vomited) Genetic screening  Not done Anatomy US R pylectesis     Assessment: Robyn Williams is a 26 y.o. [phone] with an IUP at [redacted]w[redacted]d presenting for active labor  Plan: Expectant management  CRESENZO-DISHMAN,Ethie Curless 11/05/2011, 12:00 AM

## 2011-11-05 ENCOUNTER — Encounter (HOSPITAL_COMMUNITY): Payer: Self-pay | Admitting: *Deleted

## 2011-11-05 LAB — RPR: RPR Ser Ql: NONREACTIVE

## 2011-11-05 MED ORDER — TETANUS-DIPHTH-ACELL PERTUSSIS 5-2.5-18.5 LF-MCG/0.5 IM SUSP: 0.5000 mL | Freq: Once | INTRAMUSCULAR | Status: DC

## 2011-11-05 MED ORDER — BUTORPHANOL TARTRATE 2 MG/ML IJ SOLN
1.0000 mg | Freq: Once | INTRAMUSCULAR | Status: AC
  Administered 2011-11-05: 1 mg via INTRAMUSCULAR
  Filled 2011-11-05: qty 1

## 2011-11-05 MED ORDER — DIPHENHYDRAMINE HCL 25 MG PO CAPS: 25.0000 mg | ORAL_CAPSULE | Freq: Four times a day (QID) | ORAL | Status: DC | PRN

## 2011-11-05 MED ORDER — ONDANSETRON HCL 4 MG/2ML IJ SOLN: 4.0000 mg | INTRAMUSCULAR | Status: DC | PRN

## 2011-11-05 MED ORDER — LANOLIN HYDROUS EX OINT: TOPICAL_OINTMENT | CUTANEOUS | Status: DC | PRN

## 2011-11-05 MED ORDER — METHYLERGONOVINE MALEATE 0.2 MG/ML IJ SOLN: 0.2000 mg | INTRAMUSCULAR | Status: DC | PRN

## 2011-11-05 MED ORDER — DIBUCAINE 1 % RE OINT: 1.0000 "application " | TOPICAL_OINTMENT | RECTAL | Status: DC | PRN

## 2011-11-05 MED ORDER — SIMETHICONE 80 MG PO CHEW: 80.0000 mg | CHEWABLE_TABLET | ORAL | Status: DC | PRN

## 2011-11-05 MED ORDER — MAGNESIUM HYDROXIDE 400 MG/5ML PO SUSP: 30.0000 mL | ORAL | Status: DC | PRN

## 2011-11-05 MED ORDER — ONDANSETRON HCL 4 MG PO TABS
4.0000 mg | ORAL_TABLET | ORAL | Status: DC | PRN
  Administered 2011-11-05 (×2): 4 mg via ORAL
  Filled 2011-11-05 (×2): qty 1

## 2011-11-05 MED ORDER — METHYLERGONOVINE MALEATE 0.2 MG PO TABS: 0.2000 mg | ORAL_TABLET | ORAL | Status: DC | PRN

## 2011-11-05 MED ORDER — OXYCODONE-ACETAMINOPHEN 5-325 MG PO TABS
1.0000 | ORAL_TABLET | ORAL | Status: DC | PRN
  Administered 2011-11-05 – 2011-11-06 (×3): 2 via ORAL
  Filled 2011-11-05 (×3): qty 2

## 2011-11-05 MED ORDER — IBUPROFEN 100 MG/5ML PO SUSP
600.0000 mg | Freq: Four times a day (QID) | ORAL | Status: DC
  Administered 2011-11-05 – 2011-11-06 (×3): 600 mg via ORAL
  Filled 2011-11-05 (×7): qty 30

## 2011-11-05 MED ORDER — FERROUS SULFATE 325 (65 FE) MG PO TABS
325.0000 mg | ORAL_TABLET | Freq: Two times a day (BID) | ORAL | Status: DC
  Administered 2011-11-05 – 2011-11-06 (×3): 325 mg via ORAL
  Filled 2011-11-05 (×3): qty 1

## 2011-11-05 MED ORDER — PRENATAL MULTIVITAMIN CH
1.0000 | ORAL_TABLET | Freq: Every day | ORAL | Status: DC
  Administered 2011-11-06: 1 via ORAL
  Filled 2011-11-05: qty 1

## 2011-11-05 MED ORDER — BENZOCAINE-MENTHOL 20-0.5 % EX AERO: 1.0000 "application " | INHALATION_SPRAY | CUTANEOUS | Status: DC | PRN

## 2011-11-05 MED ORDER — SENNOSIDES-DOCUSATE SODIUM 8.6-50 MG PO TABS
2.0000 | ORAL_TABLET | Freq: Every day | ORAL | Status: DC
  Administered 2011-11-05: 2 via ORAL

## 2011-11-05 MED ORDER — OXYTOCIN 20 UNITS IN LACTATED RINGERS INFUSION - SIMPLE: 125.0000 mL/h | INTRAVENOUS | Status: DC | PRN

## 2011-11-05 MED ORDER — NITROFURANTOIN MONOHYD MACRO 100 MG PO CAPS
100.0000 mg | ORAL_CAPSULE | Freq: Two times a day (BID) | ORAL | Status: DC
  Administered 2011-11-05 – 2011-11-06 (×4): 100 mg via ORAL
  Filled 2011-11-05 (×6): qty 1

## 2011-11-05 MED ORDER — MEASLES, MUMPS & RUBELLA VAC ~~LOC~~ INJ
0.5000 mL | INJECTION | Freq: Once | SUBCUTANEOUS | Status: DC
  Filled 2011-11-05: qty 0.5

## 2011-11-05 MED ORDER — ZOLPIDEM TARTRATE 5 MG PO TABS: 5.0000 mg | ORAL_TABLET | Freq: Every evening | ORAL | Status: DC | PRN

## 2011-11-05 MED ORDER — IBUPROFEN 600 MG PO TABS
600.0000 mg | ORAL_TABLET | Freq: Four times a day (QID) | ORAL | Status: DC
  Administered 2011-11-05 (×3): 600 mg via ORAL
  Filled 2011-11-05 (×3): qty 1

## 2011-11-05 MED ORDER — WITCH HAZEL-GLYCERIN EX PADS: 1.0000 "application " | MEDICATED_PAD | CUTANEOUS | Status: DC | PRN

## 2011-11-05 NOTE — H&P (Signed)
Attestation of Attending Supervision of Advanced Practitioner: Evaluation and management procedures were performed by the OB Fellow/PA/CNM/NP under my supervision and collaboration. Chart reviewed, and agree with management and plan.  Giorgia Wahler, M.D. 11/05/2011 12:14 AM   

## 2011-11-05 NOTE — Progress Notes (Signed)
UR Chart review completed.  
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
## 2011-11-06 MED ORDER — PRENATAL MULTIVITAMIN CH: 1.0000 | ORAL_TABLET | Freq: Every day | ORAL | Status: DC

## 2011-11-06 MED ORDER — IBUPROFEN 600 MG PO TABS: 600.0000 mg | ORAL_TABLET | Freq: Four times a day (QID) | ORAL | Status: AC | PRN

## 2011-11-06 MED ORDER — HYDROCODONE-ACETAMINOPHEN 5-325 MG PO TABS: 1.0000 | ORAL_TABLET | ORAL | Status: AC | PRN

## 2011-11-06 NOTE — Progress Notes (Addendum)
Baby is baby patient at this time Hugs tags still on ID braclets matched  [phone]

## 2011-11-06 NOTE — Discharge Summary (Signed)
Patient seen today and examined by me and PA [place]'s findings reaffirmed

## 2011-11-06 NOTE — Discharge Summary (Signed)
Obstetric Discharge Summary Reason for Admission: onset of labor Prenatal Procedures: ultrasound Intrapartum Procedures: spontaneous vaginal delivery Postpartum Procedures: none Complications-Operative and Postpartum: none Hemoglobin  Date Value Range Status  11/04/2011 10.3* 12.0-15.0 (g/dL) Final     HCT  Date Value Range Status  11/04/2011 31.1* 36.0-46.0 (%) Final    Physical Exam:  General: alert, cooperative and no distress Lochia: appropriate Uterine Fundus: firm Incision: n/a DVT Evaluation: No evidence of DVT seen on physical exam.  Discharge Diagnoses: Term Pregnancy-delivered  Discharge Information: Date: 11/06/2011 Activity: unrestricted Diet: routine Medications: PNV and Vicodin Condition: stable Instructions: refer to practice specific booklet Discharge to: home Follow-up Information    Follow up with [facility] in 6 weeks. (or earlier as needed if symptoms worsen)          Newborn Data: Live born female  Birth Weight: 6 lb 3 oz (2807 g) APGAR: 8, 8  Home with mother.  Jillienne Egner V 11/06/2011, 8:12 PM

## 2011-11-06 NOTE — Discharge Instructions (Signed)
See your doctor 4-5 weeks consultation for tubal litigation.   Postpartum Care After Vaginal Delivery After you deliver your baby, you will stay in the hospital for 24 to 72 hours, unless there were problems with the labor or delivery, or you have medical problems. While you are in the hospital, you will receive help and instructions on how to care for yourself and your baby. Your doctor will order pain medicine, in case you need it. You will have a small amount of bleeding from your vagina and should change your sanitary pad frequently. Wash your hands thoroughly with soap and water for at least 20 seconds after changing pads and using the toilet. Let the nurses know if you begin to pass blood clots or your bleeding increases. Do not flush blood clots down the toilet before having the nurse look at them, to make sure there is no placental tissue with them. If you had an intravenous (IV), it will be removed within 24 hours, if there are no problems. The first time you get out of bed or take a shower, call the nurse to help you because you may get weak, lightheaded, or even faint. If you are breastfeeding, you may feel painful contractions of your uterus for a couple of weeks. This is normal. The contractions help your uterus get back to normal size. If you are not breastfeeding, wear a supportive bra and handle your breasts as little as possible until your milk has dried up. Hormones should not be given to dry up the breasts, because they can cause blood clots. You will be given your normal diet, unless you have diabetes or other medical problems.  The nurses may put an ice pack on your episiotomy (surgically enlarged opening), if you have one, to reduce the pain and swelling. On rare occasions, you may not be able to urinate and the nurse will need to empty your bladder with a catheter. If you had a postpartum tubal ligation ("tying tubes," female sterilization), it should not make your stay in the hospital  longer. You may have your baby in your room with you as much as you like, unless you or the baby has a problem. Use the bassinet (basket) for the baby when going to and from the nursery. Do not carry the baby. Do not leave the postpartum area. If the mother is Rh negative (lacks a protein on the red blood cells) and the baby is Rh positive, the mother should get a Rho-gam shot to prevent Rh problems with future pregnancies. You may be given written instructions for you and your baby, and necessary medicines, when you are discharged from the hospital. Be sure you understand and follow the instructions as advised. HOME CARE INSTRUCTIONS   Follow instructions and take the medicines given to you.   Only take over-the-counter or prescription medicines for pain, discomfort, or fever as directed by your caregiver.   Do not take aspirin, because it can cause bleeding.   Increase your activities a little bit every day to build up your strength and endurance.   Do not drink alcohol, especially if you are breastfeeding or taking pain medicine.   Take your temperature twice a day and record it.   You may have a small amount of bleeding or spotting for 2 to 4 weeks. This is normal.   Do not use tampons or douche. Use sanitary pads.   Try to have someone stay and help you for a few days when you go  home.   Try to rest or take a nap when the baby is sleeping.   If you are breastfeeding, wear a good support bra. If you are not breastfeeding, wear a supportive bra and do not stimulate your nipples.   Eat a healthy, nutritious diet and continue to take your prenatal vitamins.   Do not drive, do any heavy activities, or travel until your caregiver tells you it is okay.   Do not have intercourse until your caregiver gives you permission to do so.   Ask your caregiver when you can begin to exercise and what type of exercises to do.   Call your caregiver if you think you are having a problem from your  delivery.   Call your pediatrician if you are having a problem with the baby.   Schedule your postpartum visit and keep it.  SEEK MEDICAL CARE IF:   You have a temperature of 100 F (37.8 C) or higher.   You have increased vaginal bleeding or are passing clots. Save any clots to show your caregiver.   You have bloody urine or pain when you urinate.   You have a bad smelling vaginal discharge.   You have increasing pain or swelling on your episiotomy.   You develop a severe headache.   You feel depressed.   The episiotomy is separating.   You become dizzy or lightheaded.   You develop a rash.   You have a reaction or problems with your medicine.   You have pain, redness, or swelling at the intravenous site.  SEEK IMMEDIATE MEDICAL CARE IF:   You have chest pain.   You develop shortness of breath.   You pass out.   You develop pain, with or without swelling or redness in your leg.   You develop heavy vaginal bleeding, with or without blood clots.   You develop stomach pain.   You develop a bad smelling vaginal discharge.  MAKE SURE YOU:   Understand these instructions.   Will watch your condition.   Will get help right away if you are not doing well or get worse.  Document Released: 04/12/2007 Document Revised: 06/04/2011 Document Reviewed: 04/24/2009 [facility] Patient Information 2012 [place], [place]. Sterilization, Women Sterilization is a surgical procedure. This surgery permanently prevents pregnancy in women. This can be done by tying (with or without cutting) the fallopian tubes or burning the tubes closed (tubal ligation). Tubal ligation blocks the tubes and prevents the egg from being fertilized by the sperm. Sterilization can be done by removing the ovaries that produce the egg (castration) as well. Sterilization is considered safe with very rare complications. It does not affect menstrual periods, sexual desire, or performance.  Since sterilization is  considered permanent, you should not do it until you are sure you do not want to have more children. You and your partner should fully agree to have the procedure. Your decision to have the procedure should not be made when you are in a stressful situation. This can include a loss of a pregnancy, illness or death of a spouse, or divorce. There are other means of preventing unwanted pregnancies that can be used until you are completely sure you want to be sterilized. Sterilization does not protect against sexually transmitted disease. Women who had a sterilization procedure and want it reversed must know that it requires an expensive and major operation. The reversal may not be successful and has a high rate of tubal (ectopic) pregnancy that can be dangerous and  require surgery. There are several ways to perform a tubal sterlization:  Laparoscopy. The abdomen is filled with a gas to see the pelvic organs. Then, a tube with a light attached is inserted into the abdomen through 2 small incisions. The fallopian tubes are blocked with a ring, clip or electrocautery to burn closed the tubes. Then, the gas is released and the small incisions are closed.   Hysteroscopy. A tube with a light is inserted in the vagina, through the cervix and then into the uterus. A spring-like instrument is inserted into the opening of the fallopian tubes. The spring causes scaring and blocks the tubes. Other forms of contraception should be used for three months at which time an X-ray is done to be sure the tubes are blocked.   Minilaparotomy. This is done right after giving birth. A small incision is made under the belly button and the tubes are exposed. The tubes can then be burned, tied and/or cut.   Tubal ligation can be done during a Cesarean section.   Castration is a surgical procedure that removes both ovaries.  Tubal sterilization should be discussed with your caregiver to answer any concerns you or your partner might  have. This meeting will help to decide for sure if the operation is safe for you and which procedure is the best one for you. You can change your mind and cancel the surgery at any time. HOME CARE INSTRUCTIONS   Follow your caregivers instructions regarding diet, rest, work, social and sexual activities and follow up appointments.   Shoulder pain is common following a laparoscopy. The pain may be relieved by lying down flat.   Only take over-the-counter or prescription medicines for pain, discomfort or fever as directed by your caregiver.   You may use lozenges for throat discomfort.   Keep the incisions covered to prevent infection.  SEEK IMMEDIATE MEDICAL CARE IF:   You develop a temperature of 102 F (38.9 C), or as your caregiver suggests.   You become dizzy or faint.   You start to feel sick to your stomach (nausea) or throw up (vomit).   You develop abdominal pain not relieved with over-the-counter medications.   You have redness and puffiness (swelling) of the cut (incision).   You see pus draining from the incision.   You miss a menstrual period.  Document Released: 12/02/2007 Document Revised: 06/04/2011 Document Reviewed: 12/02/2007 [facility] Patient Information 2012 [place], [place].  Laparoscopic Tubal Ligation Laparoscopic tubal ligation is a procedure that closes the fallopian tubes. Tubal ligation is also known as getting your "tubes tied." It is a brief, common and relatively simple surgical procedure. Tubal ligation is done to cause sterilization. Sterilization means you will not be able to get pregnant or have a baby. Sometimes a tubal ligation may be reversed, but this should not be considered a possibility because of failure to get pregnant and the increased risk of tubal (ectopic) pregnancy. If you want to have future pregnancies, you should not have this procedure. Use other forms of contraception. Tubal ligation can be done at any time during your menstrual cycle.  This procedure has a less than 1% failure rate. Tubal ligation does not protect against sexually transmitted disease. LET YOUR CAREGIVER KNOW ABOUT:  Allergies, especially to medicines.   All the medicines you are taking, even herbs, eyedrops, steroids, creams, and over-the-counter drugs.   The possibility of being pregnant.   Past problems with medicines.   History of blood clots or other blood  problems.   Past surgeries, medical or health problems.  RISKS AND COMPLICATIONS  Your caregiver will discuss the risks with you before the procedure. Some problems that can happen after this procedure include:  Infection. A germ starts growing in one of the wounds. This can usually be treated with antibiotic medicines.   Bleeding following surgery. Your surgeon takes every precaution to keep this from happening.   Damage to other organs. If damage to other organs or excessive bleeding should occur, it may be necessary to convert the laparoscopic procedure into an open abdominal procedure. Scarring (adhesions) from past surgeries or disease may also be a cause to change this procedure to an open abdominal operation.   Anesthetic side effects.  BEFORE THE PROCEDURE  Do not take aspirin or blood thinners a week before the procedure. This can cause bleeding. Do as directed by your caregiver.   Do not eat or drink anything 6 to 8 hours before the procedure.   Let your caregiver know if you get a cold or an infection before the procedure.   Arrive at the clinic or hospital 60 minutes before the surgery or as directed.  PROCEDURE   You may be given a mild drug (sedative) to help you relax before the procedure. Once in the operating room, you will be given a general anesthetic to make you sleep (unless you and your caregiver choose a different anesthetic).   Once you are sleeping, your surgeon makes your abdomen larger with a harmless gas (carbon dioxide). This makes your organs easier to see  and gives room to operate.   A laparoscope is then inserted into your abdomen through a small cut (incision) below the navel. A laparoscope is a thin, lighted, pencil-sized instrument. It is like a telescope. Once inserted, your caregiver can look at the organs inside your abdomen. He or she can see if there is anything abnormal.   Other small instruments also are put into the abdomen through other small openings (portals). Many surgeons attach a video camera to the laparoscope to enlarge the view. The fallopian tubes are located and either burned closed (cauterized) or a plastic clip is placed on them to close the tubes.  Sometimes, your surgeon may take tissue samples (biopsies) from other organs if he or she sees something abnormal. Biopsies can help to diagnose or confirm a disease. The samples are examined under a microscope. AFTER THE PROCEDURE   The gas is released from inside your abdomen.   Your incisions are closed with stitches (sutures). Because these incisions are small (usually less than  inch), there is usually little discomfort following the procedure.   You will rest in a recovery room until you are stable and doing well.   As long as there are no problems, you may be allowed to go home.   Someone will need to drive you home.   You will have some mild discomfort in the throat. This is from the tube placed in your throat while you were sleeping.   You may experience discomfort in the shoulder area from some trapped air between the liver and diaphragm. This will slowly go away on its own.   You will also have some mild abdominal discomfort. You will also have discomfort from the incisions where the instruments were placed into your abdomen.  HOME CARE INSTRUCTIONS   Only take over-the-counter or prescription medicines for pain, discomfort, or fever as directed by your caregiver. Do not take aspirin. It can cause  bleeding.   Resume daily activities, diet, rest, driving, and  work as directed.   Showers are preferred over baths.   Resume sexual activities in 1 week or as directed.   If biopsies were taken, find out how to get your results. The results are usually given during the follow-up visit. Do not assume tests are normal if you do not hear from your caregiver. It is your responsibility to obtain your results.   Change the dressings as directed.   It is helpful to have someone with you for several hours after you go home. They can help you and be available if you have problems.  SEEK MEDICAL CARE IF:   You have increasing abdominal pain.   You develop new pain in your shoulders in the shoulder strap area that gets worse with time. Some pain is common and expected.   You feel lightheaded or faint.   You have chills or fever.   You develop bleeding or drainage from the suture sites or the vagina after surgery.   You develop chest pain.   You develop shortness of breath.   You develop a rash.  Document Released: 09/21/2000 Document Revised: 06/04/2011 Document Reviewed: 01/04/2008 [facility] Patient Information 2012 [place], [place]. Breast Pumping Tips Pumping your breast milk is a good way to stimulate milk production and have a steady supply of breast milk for your infant. Pumping is most helpful during your infant's growth spurts, when involving dad or a family member, or when you are away. There are several types of pumps available. They can be purchased at a baby or maternity store. You can begin pumping soon after delivery, but some experts believe that you should wait about four weeks to give your infant a bottle. In general, the more you breastfeed or pump, the more milk you will have for your infant. It is also important to take good care of yourself. This will reduce stress and help your body to create a healthy supply of milk. Your caregiver or lactation consultant can give you the information and support you need in your efforts to breastfeed  your infant. PUMPING BREAST MILK  Follow the tips below for successful breast pumping. Take care of yourself.  Drink enough water or fluids to keep urine clear or pale yellow. You may notice a thirsty feeling while breastfeeding. This is because your body needs more water to make breast milk. Keep a large water bottle handy. Make healthy drink choices such as unsweetened fruit juice, milk and water. Limit soda, coffee, and alcohol (wait 2 hours to feed or pump if you have an alcoholic drink.)   Eat a healthy, well-balanced diet rich in fruits, vegetables, and whole grains.   Exercise as recommended by your caregiver.   Get plenty of sleep. Sleep when your infant sleeps. Ask friends and family for help if you need time to nap or rest.   Do not smoke. Smoking can lower your milk supply and harm your infant. If you need help quitting, ask your caregiver for a program recommendation.   Ask your caregiver about birth control options. Birth control pills may lower your milk supply. You may be advised to use condoms or other forms of birth control.  Relax and pump Stimulating your let-down reflex is the key to successful and effective pumping. This makes the milk in all parts of the breast flow more freely.   It is easier to pump breast milk (and breastfeed) while you are relaxed. Find techniques  that work for you. Quiet private spaces, breast massage, soothing heat placed on the breast, music, and pictures or a tape recording of your infant may help you to relax and "let down" your milk. If you have difficulty with your let down, try smelling one of your infant's blankets or an item of clothing he or she has worn while you are pumping.   When pumping, place the special suction cup (flange) directly over the nipple. It may be uncomfortable and cause nipple damage if it is not placed properly or is the wrong size. Applying a small amount of purified or modified lanolin to your nipple and the areola may  help increase your comfort level. Also, you can change the speed and suction of many electric pumps to your comfort level. Your caregiver or lactation consultant can help you with this.   If pumping continues to be painful, or you feel you are not getting very much milk when you pump, you may need a different type of pump. A lactation consultant can help you determine if this is the case.   If you are with your infant, feed him or her on demand and try pumping after each feeding. This will boost your production, even if milk does not come out. You may not be able to pump much milk at first, but keep up the routine, and this will change.   If you are working or away from your infant for several hours, try pumping for about 15 minutes every 2 to 3 hours. Pump both breasts at the same time if you can.   If your infant has a formula feeding, make sure you pump your milk around the same time to maintain your supply.   Begin pumping breast milk a few weeks before you return to work. This will help you develop techniques that work for you and will be able to store extra milk.   Find a source of breastfeeding information that works well for you.  TIPS FOR STORING BREAST MILK  Store breast milk in a sealable sterile bag, jar, or container provided with your pumping supplies.   Store milk in small amounts close to what your infant is drinking at each feeding.   Cool pumped milk in a refrigerator or cooler. Pumped milk can last at the back of the refrigerator for 3 to 8 days.   Place cooled milk at the back of the freezer for up to 3 months.   Thaw the milk in its container or bag in warm water up to 24 hours in advance. Do not use a microwave to thaw or heat milk. Do not refreeze the milk after it has been thawed.   Breast milk is safe to drink when left at room temperature (mid 70s or colder) for 4 to 8 hours. After that, throw it away.   Milk fat can separate and look funny. The color can vary  slightly from day to day. This is normal. Always shake the milk before using it to mix the fat with the more watery portion.  SEEK MEDICAL CARE IF:   You are having trouble pumping or feeding your infant.   You are concerned that you are not making enough milk.   You have nipple pain, soreness, or redness.   You have other questions or concerns related to you or your infant.  Document Released: 12/03/2009 Document Revised: 06/04/2011 Document Reviewed: 12/03/2009 [facility] Patient Information 2012 [place], [place]. Infant Formula Feeding Breastfeeding is always recommended  as the first choice for feeding a baby. This is sometimes called "exclusive breastfeeding." That is the goal. But sometimes it is not possible. For instance:  The baby's mother might not be physically able to breastfeed.   The mother might not be present.   The mother might have a health problem. She could have an infection. Or she could be dehydrated (not have enough fluids).   Some mothers are taking medicines for cancer or another health problem. These medicines can get into breast milk. Some of the medicines could harm a baby.   Some babies need extra calories. They may have been tiny at birth. Or they might be having trouble gaining weight.  Giving a baby formula in these situations is not a bad thing. Other caregivers can feed the baby. This can give the mother a break for sleep or work. It also gives the baby a chance to bond with other people. PRECAUTIONS  Make sure you know just how much formula the baby should get at each feeding. For example, newborns need 2 to 3 ounces every 2 to 3 hours. Markings on the bottle can help you keep track. It may be helpful to keep a log of how much the baby eats at each feeding.   Do not give the infant anything other than breast milk or formula. A baby must not drink cow's milk, juice, soda, or other sweet drinks.   Do not add cereal to the milk or formula, unless the baby's  healthcare provider has said to do so.   Always hold the bottle during feedings. Never prop up a bottle to feed a baby.   Never let the baby fall asleep with a bottle in the crib.   Never feed the baby a bottle that has been at room temperature for over two hours or from a bottle used for a previous feeding. After the baby finishes a feeding, throw away any formula left in the bottle.  BEFORE FEEDING  Prepare a bottle of formula. If you are using formula that was stored in the refrigerator, warm it up. To do this, hold it under warm, running water or in a pan of hot water for a few minutes. Never use a microwave to warm up a bottle of formula.   Test the temperature of the formula. Place a few drops on the inside of your wrist. It should be warm, but not hot.   Find a location that is comfortable for you and the baby. A large chair with arms to support your arms is often a good choice. You may want to put pillows under your arms and under the baby for support.   Make sure the room temperature is OK. It should not be too hot or too cold for you and for the baby.   Have some burp cloths nearby. You will need them to clean up spills or spit-ups.  TO FEED THE BABY  Hold the baby close to your body. Make eye contact. This helps bonding.   Support the baby's head in the crook of your arm. Cradle him or her at a slight angle. The baby's head should be higher than the stomach. A baby should not be fed while lying flat.   Hold the bottle of formula at an angle. The formula should completely fill the neck of the bottle. It should cover the nipple. This will keep the baby from sucking in air. Swallowing air is uncomfortable.   Stroke the baby's cheek  or lower lip lightly with the nipple. This can get the baby to open his or her mouth. Then, slip the nipple into the baby's mouth. Sucking and swallowing should start. You might need to try different types of nipples to find the one your baby likes best.     Let the baby tell you when he or she is done. The baby's head might turn away. Or, the baby's lips might push away the nipple. It is OK if the baby does not finish the bottle.   You might need to burp the baby halfway through a feeding. Then, just start feeding again.   Burp the baby again when the feeding is done.  Document Released: 07/07/2009 Document Revised: 06/04/2011 Document Reviewed: 07/07/2009 [facility] Patient Information 2012 [place], [place].

## 2011-11-06 NOTE — Discharge Summary (Signed)
Obstetric Discharge Summary Reason for Admission: onset of labor Prenatal Procedures: no PNC Intrapartum Procedures: spontaneous vaginal delivery Postpartum Procedures: none Complications-Operative and Postpartum: none Hemoglobin  Date Value Range Status  11/04/2011 10.3* 12.0-15.0 (g/dL) Final     HCT  Date Value Range Status  11/04/2011 31.1* 36.0-46.0 (%) Final    Physical Exam:  General: alert, cooperative and no distress Lochia: appropriate Uterine Fundus: firm  DVT Evaluation: No evidence of DVT seen on physical exam.  Discharge Diagnoses: Term Pregnancy-delivered  Discharge Information: Date: 11/06/2011 Activity: pelvic rest Diet: routine Medications: Ibuprofen Condition: stable Instructions: refer to practice specific booklet Discharge to: home   Newborn Data: Live born female  Birth Weight: 6 lb 3 oz (2807 g) APGAR: 8, 8  Home with mother per pediatric team.  Anders Simmonds 11/06/2011, 9:36 AM

## 2011-11-07 LAB — CULTURE, OB URINE: Colony Count: >=100000

## 2011-11-09 DIAGNOSIS — O0930 Supervision of pregnancy with insufficient antenatal care, unspecified trimester: Secondary | ICD-10-CM

## 2011-11-10 ENCOUNTER — Encounter: Payer: Self-pay | Admitting: Physician Assistant

## 2011-11-11 ENCOUNTER — Encounter: Payer: Self-pay | Admitting: Advanced Practice Midwife

## 2011-11-23 ENCOUNTER — Inpatient Hospital Stay (HOSPITAL_COMMUNITY): Payer: Self-pay

## 2011-11-23 ENCOUNTER — Encounter (HOSPITAL_COMMUNITY): Payer: Self-pay | Admitting: *Deleted

## 2011-11-23 ENCOUNTER — Inpatient Hospital Stay (HOSPITAL_COMMUNITY)
Admission: AD | Admit: 2011-11-23 | Discharge: 2011-11-23 | Disposition: A | Discharge status: Home / Self Care | Payer: Self-pay | Source: Ambulatory Visit | Attending: Obstetrics & Gynecology | Admitting: Obstetrics & Gynecology

## 2011-11-23 DIAGNOSIS — O8612 Endometritis following delivery: Secondary | ICD-10-CM

## 2011-11-23 LAB — DIFFERENTIAL
Basophils Absolute: 0.1 10*3/uL (ref 0.0–0.1)
Eosinophils Relative: 1 % (ref 0–5)
Lymphocytes Relative: 42 % (ref 12–46)
Lymphs Abs: 3.5 10*3/uL (ref 0.7–4.0)
Monocytes Absolute: 0.4 10*3/uL (ref 0.1–1.0)
Monocytes Relative: 5 % (ref 3–12)
Neutro Abs: 4.2 10*3/uL (ref 1.7–7.7)

## 2011-11-23 LAB — CBC
HCT: 33.8 % — ABNORMAL LOW (ref 36.0–46.0)
Hemoglobin: 10.5 g/dL — ABNORMAL LOW (ref 12.0–15.0)
MCV: 95.8 fL (ref 78.0–100.0)
RBC: 3.53 MIL/uL — ABNORMAL LOW (ref 3.87–5.11)
WBC: 8.2 10*3/uL (ref 4.0–10.5)

## 2011-11-23 LAB — TYPE AND SCREEN

## 2011-11-23 MED ORDER — CIPROFLOXACIN HCL 500 MG PO TABS: 500.0000 mg | ORAL_TABLET | Freq: Two times a day (BID) | ORAL | Status: AC

## 2011-11-23 MED ORDER — METHYLERGONOVINE MALEATE 0.2 MG PO TABS: 0.2000 mg | ORAL_TABLET | Freq: Four times a day (QID) | ORAL | Status: AC

## 2011-11-23 MED ORDER — LACTATED RINGERS IV SOLN
INTRAVENOUS | Status: DC
  Administered 2011-11-23: 07:00:00 via INTRAVENOUS

## 2011-11-23 MED ORDER — IBUPROFEN 100 MG/5ML PO SUSP
600.0000 mg | Freq: Four times a day (QID) | ORAL | Status: DC
  Administered 2011-11-23: 600 mg via ORAL
  Filled 2011-11-23 (×2): qty 30

## 2011-11-23 NOTE — Discharge Instructions (Signed)
Someone from clinic will call you for an appointment (we need to see you in 1 week)  Postpartum Hemorrhage Postpartum hemorrhage is blood loss of more than 500 mL after childbirth. This is about two cups of blood. Most bleeding happens within 24 hours after birth. This is called primary postpartum hemorrhage. Less commonly, bleeding occurs more than 24 hours after birth, which is called secondary hemorrhage. The most common cause is uterine atony. This means your uterus does not shrink (contract) properly following delivery. When this happens, the blood vessels in the uterus are not squeezed and they keep on bleeding. This is serious. SYMPTOMS  Bleeding after delivery is normal and you should expect vaginal bleeding. This means you will have bleeding coming from the birth canal for many days following childbirth. This is to be expected with both normal births and c-sections.  You are bleeding too much following your delivery if you are:  Passing large clots or pieces of tissue. This may be small pieces of placenta left after delivery.   Soaking more than one sanitary pad per hour for several hours.   Having heavy, bright-red bleeding which occurs four days or more after delivery.   Having a discharge which has a bad smell or if you begin to run an unexplained temperature over 101 F (38.3 C).   Having times of lightheadedness or fainting, feeling short of breath or having your heart beat fast with very little activity.  If you are having any of these symptoms, call your caregiver right away. TREATMENT  Treatments used include medications and blood transfusions. If these treatments do not work, surgery may have to be done to remove the womb (uterus).   Sometimes bleeding occurs if portions of the afterbirth (placenta) are left behind in the uterus following delivery. If this happens, often a curettage or scraping of the inside of the uterus must be done. This usually stops the bleeding.   If  bleeding is due to clotting or bleeding problems, which are not related to the pregnancy, other treatments may be needed.  HOME CARE INSTRUCTIONS   Your caregiver may order bed rest (getting up to the bathroom only), or may allow you to continue light activity.   Keep track of the number of pads you use each day and how soaked (saturated) they are. Write this down.   Do no use tampons. Do not douche or have sexual intercourse until approved by your physician.   Rest and drink extra fluids.   Get proper amounts of rest and exercise.   If you are anemic following your pregnancy, be sure to take the iron and vitamin supplements that your caregiver recommends.   A diet rich in iron, such as spinach, red meat and legumes will be helpful.  SEEK IMMEDIATE MEDICAL CARE IF:  You experience severe cramps in your stomach, back or belly (abdomen).   You run an unexplained temperature of over 101 F (38.3 C) or as told by your caregiver.   You pass large clots or tissue. Save any tissue for your caregiver to look at.   Your bleeding increases or you become light-headed, weak, or faint.  Document Released: 09/05/2003 Document Revised: 06/04/2011 Document Reviewed: 03/30/2008 [facility] Patient Information 2012 [place], [place].

## 2011-11-23 NOTE — MAU Provider Note (Signed)
History     CSN: [id number]  Arrival date and time: 11/23/11 4098   First Provider Initiated Contact with Patient 11/23/11 [phone]      Chief Complaint  Patient presents with  . Vaginal Bleeding   HPI  Received report from Zerita Boers CNM Only small amount of bleeding now  ROS Physical Exam   Blood pressure 121/81, pulse 88, temperature 98.4 F (36.9 C), temperature source Oral, resp. rate 18, height 5\' 5"  (1.651 m), SpO2 98.00%.  Physical Exam Small bleeding Results for orders placed during the hospital encounter of 11/23/11 (from the past 24 hour(s))  CBC     Status: Abnormal   Collection Time   11/23/11  7:05 AM      Component Value Range   WBC 8.2  4.0 - 10.5 (K/uL)   RBC 3.53 (*) 3.87 - 5.11 (MIL/uL)   Hemoglobin 10.5 (*) 12.0 - 15.0 (g/dL)   HCT 47.8 (*) 29.5 - 46.0 (%)   MCV 95.8  78.0 - 100.0 (fL)   MCH 29.7  26.0 - 34.0 (pg)   MCHC 31.1  30.0 - 36.0 (g/dL)   RDW 62.1  30.8 - 65.7 (%)   Platelets 347  150 - 400 (K/uL)  DIFFERENTIAL     Status: Normal   Collection Time   11/23/11  7:05 AM      Component Value Range   Neutrophils Relative 51  43 - 77 (%)   Neutro Abs 4.2  1.7 - 7.7 (K/uL)   Lymphocytes Relative 42  12 - 46 (%)   Lymphs Abs 3.5  0.7 - 4.0 (K/uL)   Monocytes Relative 5  3 - 12 (%)   Monocytes Absolute 0.4  0.1 - 1.0 (K/uL)   Eosinophils Relative 1  0 - 5 (%)   Eosinophils Absolute 0.1  0.0 - 0.7 (K/uL)   Basophils Relative 1  0 - 1 (%)   Basophils Absolute 0.1  0.0 - 0.1 (K/uL)  TYPE AND SCREEN     Status: Normal   Collection Time   11/23/11  7:05 AM      Component Value Range   ABO/RH(D) A POS     Antibody Screen NEG     Sample Expiration 11/26/2011     US showed:    US Pelvis Complete  11/23/2011  *RADIOLOGY REPORT*  Clinical Data: Abnormal uterine bleeding.  3 weeks postpartum.  TRANSABDOMINAL AND TRANSVAGINAL ULTRASOUND OF PELVIS Technique:  Both transabdominal and transvaginal ultrasound examinations of the pelvis were  performed. Transabdominal technique was performed for global imaging of the pelvis including uterus, ovaries, adnexal regions, and pelvic cul-de-sac.  Comparison: None.   It was necessary to proceed with endovaginal exam following the transabdominal exam to visualize the endometrium.  Findings:  Uterus: Normal.  9.8 x 5.6 x 6.6 cm.  Endometrium: The endometrium is thickened and irregular with a focal area of increased vascularity consistent with retained products of conception.  17.2 mm in thickness.  Right ovary:  Normal.  3.3 x 2.7 x 2.2 cm.  Left ovary: Normal.  3.7 x 1.3 x 2.1 cm.  Other findings: No free fluid  IMPRESSION: Thickened irregular endometrium with a focal area of hypervascularity consistent with retained products of conception.  Original Report Authenticated By: Gwynn Burly, M.D.   MAU Course  Procedures  MDM Discussed with Dr Despina Hidden.  Will give Cipro 500 bid x 10 d and Methergine 0.2mg  q 4 hrs for 2 days, then recheck in Clinic to see  if it still looks like retained POCs.  He suspects it may just be clot from endometritis.  Assessment and Plan  Discharge home RTC 1 week  [facility] 11/23/2011, 9:58 AM

## 2011-11-23 NOTE — MAU Provider Note (Signed)
O: Sterile spec exam done, scant amt bleeding noted at present time, sm clot removed from cervical os. Fundal ht apx 12-14 wks size, no tenderness.  A: 20 days post partum, delayed PPH. Stable ast present time. P: CBC, orthostatics, IV, pelvic U/S. Consult MD.

## 2011-11-23 NOTE — MAU Note (Signed)
Discussed U/S results with E. Key, NP. Decision to hold oral ibuprofen and other po's until consult with MD. Patient informed. States pain is less now and is OK to wait.

## 2011-11-23 NOTE — MAU Note (Signed)
Pt reports she had a SVD 11/04/2011, has had small amount of vaginal bleeding since delivery and at 0540 this am had sudden onset of heavy bleeding, passing clots, "soaking" a pad in 20 minutes. cramping

## 2011-12-03 ENCOUNTER — Ambulatory Visit: Payer: Self-pay | Admitting: Physician Assistant

## 2012-08-15 ENCOUNTER — Ambulatory Visit: Payer: Self-pay

## 2013-01-10 ENCOUNTER — Emergency Department: Payer: Self-pay | Admitting: Emergency Medicine

## 2013-01-17 ENCOUNTER — Emergency Department: Payer: Self-pay | Admitting: Emergency Medicine

## 2014-04-30 ENCOUNTER — Encounter (HOSPITAL_COMMUNITY): Payer: Self-pay | Admitting: *Deleted

## 2015-07-05 ENCOUNTER — Encounter (HOSPITAL_COMMUNITY): Payer: Self-pay | Admitting: *Deleted

## 2015-07-05 ENCOUNTER — Emergency Department (HOSPITAL_COMMUNITY)
Admission: EM | Admit: 2015-07-05 | Discharge: 2015-07-05 | Disposition: A | Discharge status: Home / Self Care | Payer: PRIVATE HEALTH INSURANCE | Attending: Physician Assistant | Admitting: Physician Assistant

## 2015-07-05 DIAGNOSIS — R109 Unspecified abdominal pain: Secondary | ICD-10-CM | POA: Insufficient documentation

## 2015-07-05 DIAGNOSIS — Z91040 Latex allergy status: Secondary | ICD-10-CM | POA: Insufficient documentation

## 2015-07-05 DIAGNOSIS — R112 Nausea with vomiting, unspecified: Secondary | ICD-10-CM | POA: Insufficient documentation

## 2015-07-05 DIAGNOSIS — Z8659 Personal history of other mental and behavioral disorders: Secondary | ICD-10-CM | POA: Insufficient documentation

## 2015-07-05 DIAGNOSIS — Z862 Personal history of diseases of the blood and blood-forming organs and certain disorders involving the immune mechanism: Secondary | ICD-10-CM | POA: Insufficient documentation

## 2015-07-05 DIAGNOSIS — N189 Chronic kidney disease, unspecified: Secondary | ICD-10-CM | POA: Insufficient documentation

## 2015-07-05 DIAGNOSIS — Z87440 Personal history of urinary (tract) infections: Secondary | ICD-10-CM | POA: Insufficient documentation

## 2015-07-05 DIAGNOSIS — Z87442 Personal history of urinary calculi: Secondary | ICD-10-CM | POA: Insufficient documentation

## 2015-07-05 DIAGNOSIS — R197 Diarrhea, unspecified: Secondary | ICD-10-CM | POA: Insufficient documentation

## 2015-07-05 DIAGNOSIS — F17210 Nicotine dependence, cigarettes, uncomplicated: Secondary | ICD-10-CM | POA: Insufficient documentation

## 2015-07-05 LAB — LIPASE, BLOOD: LIPASE: 16 U/L (ref 11–51)

## 2015-07-05 LAB — CBC
HEMATOCRIT: 36.5 % (ref 36.0–46.0)
HEMOGLOBIN: 11.8 g/dL — AB (ref 12.0–15.0)
MCH: 33.6 pg (ref 26.0–34.0)
MCHC: 32.3 g/dL (ref 30.0–36.0)
MCV: 104 fL — AB (ref 78.0–100.0)
Platelets: 175 10*3/uL (ref 150–400)
RBC: 3.51 MIL/uL — AB (ref 3.87–5.11)
RDW: 12.7 % (ref 11.5–15.5)
WBC: 2.5 10*3/uL — AB (ref 4.0–10.5)

## 2015-07-05 LAB — COMPREHENSIVE METABOLIC PANEL
ALT: 26 U/L (ref 14–54)
AST: 21 U/L (ref 15–41)
Albumin: 4 g/dL (ref 3.5–5.0)
Alkaline Phosphatase: 57 U/L (ref 38–126)
Anion gap: 8 (ref 5–15)
BUN: 11 mg/dL (ref 6–20)
CHLORIDE: 104 mmol/L (ref 101–111)
CO2: 27 mmol/L (ref 22–32)
Calcium: 9.2 mg/dL (ref 8.9–10.3)
Creatinine, Ser: 0.57 mg/dL (ref 0.44–1.00)
Glucose, Bld: 95 mg/dL (ref 65–99)
POTASSIUM: 3.5 mmol/L (ref 3.5–5.1)
SODIUM: 139 mmol/L (ref 135–145)
TOTAL PROTEIN: 7.3 g/dL (ref 6.5–8.1)
Total Bilirubin: 0.7 mg/dL (ref 0.3–1.2)

## 2015-07-05 LAB — I-STAT BETA HCG BLOOD, ED (MC, WL, AP ONLY): I-stat hCG, quantitative: <5 m[IU]/mL (ref <5)

## 2015-07-05 MED ORDER — LORAZEPAM 2 MG/ML IJ SOLN
1.0000 mg | Freq: Once | INTRAMUSCULAR | Status: AC
  Administered 2015-07-05: 1 mg via INTRAVENOUS
  Filled 2015-07-05: qty 1

## 2015-07-05 MED ORDER — PROCHLORPERAZINE EDISYLATE 5 MG/ML IJ SOLN
10.0000 mg | Freq: Once | INTRAMUSCULAR | Status: AC
  Administered 2015-07-05: 10 mg via INTRAVENOUS
  Filled 2015-07-05: qty 2

## 2015-07-05 MED ORDER — ACETAMINOPHEN 500 MG PO TABS
1000.0000 mg | ORAL_TABLET | Freq: Once | ORAL | Status: DC
  Filled 2015-07-05: qty 2

## 2015-07-05 MED ORDER — SODIUM CHLORIDE 0.9 % IV BOLUS (SEPSIS)
1000.0000 mL | Freq: Once | INTRAVENOUS | Status: AC
  Administered 2015-07-05: 1000 mL via INTRAVENOUS

## 2015-07-05 MED ORDER — PROCHLORPERAZINE MALEATE 10 MG PO TABS: 10.0000 mg | ORAL_TABLET | Freq: Two times a day (BID) | ORAL | Status: DC | PRN

## 2015-07-05 NOTE — ED Notes (Signed)
Pt states she feels much better.  Headache and nausea greatly improved.

## 2015-07-05 NOTE — ED Notes (Addendum)
Pt reports headache, mid abd pain, fever and n/v/d since yesterday morning.

## 2015-07-05 NOTE — ED Notes (Signed)
Pt advising she does not think she can keep tylenol down. PA Muthersbaugh in room, aware of patient's concerns, advised she will order compazine for patient.

## 2015-07-05 NOTE — Discharge Instructions (Signed)
1. Medications: compazine, usual home medications 2. Treatment: rest, drink plenty of fluids, advance diet slowly 3. Follow Up: Please followup with your primary doctor in 2 days for discussion of your diagnoses and further evaluation after today's visit; if you do not have a primary care doctor use the resource guide provided to find one; Please return to the ER for persistent vomiting, high fevers or worsening symptoms   Viral Gastroenteritis Viral gastroenteritis is also known as stomach flu. This condition affects the stomach and intestinal tract. It can cause sudden diarrhea and vomiting. The illness typically lasts 3 to 8 days. Most people develop an immune response that eventually gets rid of the virus. While this natural response develops, the virus can make you quite ill. CAUSES  Many different viruses can cause gastroenteritis, such as rotavirus or noroviruses. You can catch one of these viruses by consuming contaminated food or water. You may also catch a virus by sharing utensils or other personal items with an infected person or by touching a contaminated surface. SYMPTOMS  The most common symptoms are diarrhea and vomiting. These problems can cause a severe loss of body fluids (dehydration) and a body salt (electrolyte) imbalance. Other symptoms may include:  Fever.  Headache.  Fatigue.  Abdominal pain. DIAGNOSIS  Your caregiver can usually diagnose viral gastroenteritis based on your symptoms and a physical exam. A stool sample may also be taken to test for the presence of viruses or other infections. TREATMENT  This illness typically goes away on its own. Treatments are aimed at rehydration. The most serious cases of viral gastroenteritis involve vomiting so severely that you are not able to keep fluids down. In these cases, fluids must be given through an intravenous line (IV). HOME CARE INSTRUCTIONS   Drink enough fluids to keep your urine clear or pale yellow. Drink small  amounts of fluids frequently and increase the amounts as tolerated.  Ask your caregiver for specific rehydration instructions.  Avoid:  Foods high in sugar.  Alcohol.  Carbonated drinks.  Tobacco.  Juice.  Caffeine drinks.  Extremely hot or cold fluids.  Fatty, greasy foods.  Too much intake of anything at one time.  Dairy products until 24 to 48 hours after diarrhea stops.  You may consume probiotics. Probiotics are active cultures of beneficial bacteria. They may lessen the amount and number of diarrheal stools in adults. Probiotics can be found in yogurt with active cultures and in supplements.  Wash your hands well to avoid spreading the virus.  Only take over-the-counter or prescription medicines for pain, discomfort, or fever as directed by your caregiver. Do not give aspirin to children. Antidiarrheal medicines are not recommended.  Ask your caregiver if you should continue to take your regular prescribed and over-the-counter medicines.  Keep all follow-up appointments as directed by your caregiver. SEEK IMMEDIATE MEDICAL CARE IF:   You are unable to keep fluids down.  You do not urinate at least once every 6 to 8 hours.  You develop shortness of breath.  You notice blood in your stool or vomit. This may look like coffee grounds.  You have abdominal pain that increases or is concentrated in one small area (localized).  You have persistent vomiting or diarrhea.  You have a fever.  The patient is a child younger than 3 months, and he or she has a fever.  The patient is a child older than 3 months, and he or she has a fever and persistent symptoms.  The patient  is a child older than 3 months, and he or she has a fever and symptoms suddenly get worse.  The patient is a baby, and he or she has no tears when crying. MAKE SURE YOU:   Understand these instructions.  Will watch your condition.  Will get help right away if you are not doing well or get  worse.   This information is not intended to replace advice given to you by your health care provider. Make sure you discuss any questions you have with your health care provider.   Document Released: 06/15/2005 Document Revised: 09/07/2011 Document Reviewed: 04/01/2011 Elsevier Interactive Patient Education 2016 [employer].    Emergency Department Resource Guide 1) Find a Doctor and Pay Out of Pocket Although you won't have to find out who is covered by your insurance plan, it is a good idea to ask around and get recommendations. You will then need to call the office and see if the doctor you have chosen will accept you as a new patient and what types of options they offer for patients who are self-pay. Some doctors offer discounts or will set up payment plans for their patients who do not have insurance, but you will need to ask so you aren't surprised when you get to your appointment.  2) Contact Your Local Health Department Not all health departments have doctors that can see patients for sick visits, but many do, so it is worth a call to see if yours does. If you don't know where your local health department is, you can check in your phone book. The CDC also has a tool to help you locate your state's health department, and many state websites also have listings of all of their local health departments.  3) Find a Walk-in Clinic If your illness is not likely to be very severe or complicated, you may want to try a walk in clinic. These are popping up all over the country in pharmacies, drugstores, and shopping centers. They're usually staffed by nurse practitioners or physician assistants that have been trained to treat common illnesses and complaints. They're usually fairly quick and inexpensive. However, if you have serious medical issues or chronic medical problems, these are probably not your best option.  No Primary Care Doctor: - Call Health Connect at  [phone] - they can help you  locate a primary care doctor that  accepts your insurance, provides certain services, etc. - Physician Referral Service- [phone]  Chronic Pain Problems: Organization         Address  Phone   Notes  [facility]  [phone] Patients need to be referred by their primary care doctor.   Medication Assistance: Organization         Address  Phone   Notes  [facility] [place] [facility] Medication [facility] [address] [place] [place], [place] [address] [phone] --Must be a resident of [facility] -- Must have NO insurance coverage whatsoever (no Medicaid/ Medicare, etc.) -- The pt. MUST have a primary care doctor that directs their care regularly and follows them in the community   [facility]ist  [phone]   [employer]  [phone]    Agencies that provide inexpensive medical care: Organization         Address  Phone   Notes  [facility]  [phone]   [facility]    [phone]   [facility] [address]  [place], [place] [id number] [phone]) [phone]   [facility] [place] [address], [place] [phone]) [phone]   Planned Parenthood    [phone](336) [phone]   [facility]    [phone]   [facility]  [address], [place] Phone:  [phone](336) [phone], Fax:  [phone] Hours of Operation:  9 am - 6 pm, M-F.  Also accepts Medicaid/Medicare and self-pay.  [facility]  [address], [place] Phone: [phone](336) [phone], Fax: [phone]) [phone]. Hours of Operation:  8:30 am - 5:30 pm, M-F.  Also accepts Medicaid and self-pay.  [facility] [place] [address], [place] Phone: [phone](336) [phone]   [facility] [address], [place], [place] [phone], Ext. [phone] Mondays & Thursdays: 7-9 AM.  First 15 patients are seen on a first come, first serve basis.    Medicaid-accepting [facility]  Providers:  Organization         Address  Phone   Notes  [facility] [address], [place] [phone]) [phone] Also accepts self-pay patients.  [facility] [address], [place]  [phone]) [phone]   [facility] [address], [place] [phone]) [phone]   [facility] [address] [place] [address], [place] [phone]) [phone]   Renaye RakersVeita Bland [address], [place]   [phone]) [phone] Only accepts [place] Access [place] patients after they have their name applied to their card.   Self-Pay (no insurance) in [facility]:  Organization         Address  Phone   Notes  Sickle Cell Patients, [facility] [address], [place] [phone](336) [phone]   [facility] [address], [place] [phone](336) [phone]   [facility] [place]  [address], [place] [phone]) [phone]   [facility]/Dr. Osei-Bonsu  [address] [place] [address], [place] or [phone], [place] [phone] Phone number for both [place] and [place] locations is the same.  [facility] [address], [place] [phone]) [phone]   [facility] [place] [address] [place] [address], [place] or [address] [phone]) [phone]   [facility] [address], [place] [phone](336) [phone], phone; [phone](336) [phone], fax Sees patients 1st and 3rd Saturday of every month.  Must not qualify for public or private insurance (i.e. Medicaid, Medicare, [place] Health Choice, Veterans' Benefits)  Household income should be no more than 200% of the poverty level The clinic cannot treat you if you are pregnant or think you are pregnant  Sexually transmitted diseases are not treated at the clinic.    Dental Care: Organization         Address  Phone  Notes  [facility] [address], [place] [phone] Accepts children up to age 28 who are enrolled in [place] or [place] Health Choice; pregnant women with a Medicaid card; and children who have applied for Medicaid or [place] Health Choice, but were declined, whose parents can pay a reduced fee at time of service.  [facility] [place]  [address], [place] [phone]) [phone] Accepts children up to age 28 who are enrolled in [place] or [place] Health Choice;  pregnant women with a Medicaid card; and children who have applied for Medicaid or [place] Health Choice, but were declined, whose parents can pay a reduced fee at time of service.  [facility]  [address] [place], [place] [phone] Patients are seen by appointment only. Walk-ins are not accepted. [facility] will see patients 73 years of age and older. Monday - Tuesday (8am-5pm) Most Wednesdays (8:30-5pm) $30 per visit, cash only  [facility] Adult [facility] Access PRO[facility]  [address], [facility] [phone] Patients are seen by appointment only. Walk-ins are not accepted. [facility] will see patients 1 years of age and older. One Wednesday Evening (Monthly: Volunteer Based).  $30 per visit, cash only  [employer]  [phone] for adults; Children under age 94, call Graduate Pediatric Dentistry at [phone]. Children aged 55-14, please call [phone] to request a pediatric application.  Dental services are provided in all areas of dental care including fillings, crowns and bridges, complete and partial dentures, implants, gum treatment, root canals, and extractions. Preventive care is also provided. Treatment is provided to both adults and children. Patients are selected via a lottery and there is often a waiting list.   [facility] [address] [place]  [phone] [email or web address]   [facility]  [address] [place], [place] [phone], Ext. [phone] Second and Fourth Thursday of each month, opens at 6:30 AM; Clinic ends at 9 AM.  Patients are seen on a first-come first-served basis, and a limited number are seen during each clinic.   [facility]  [address] Ether [place]ins [place], [place] [phone]   Eligibility Requirements You must have lived in [place], [place], or [place] counties for at least the last three months.   You cannot be eligible for state or federal sponsored [place], including [employer], [place], or [employer].   You generally cannot be eligible for healthcare insurance through your employer.    How to apply: Eligibility screenings are held every Tuesday and Wednesday afternoon from 1:00 pm until 4:00 pm. You do not need an appointment for the interview!  [facility] [address] [place], [place] [phone]   [facility] Health Department  [phone]   [facility] Health Department  [phone]   [facility] Health Department  [phone]    Behavioral Health Resources in the Community: Intensive Outpatient Programs Organization         Address  Phone  Notes  [facility] [address] [place], [place] [phone]   [facility] Outpatient [address], [place], [place] [phone]   [facility] [address] [place], [place]  [phone]   [facility] Mental Health [address]  [place], [place] [phone] or [phone]   Substance Abuse Resources Organization         Address  Phone  Notes  [facility] Drug Services  [phone]   [facility]  [phone]   [facility] [place]  [phone]   Floydene Flock  [phone]   Residential & Outpatient Substance Abuse Program  [phone]   Psychological Services Organization         Address  Phone  Notes  [facility]  [phone]     [facility]  [phone]   [facility] Mental Health [address], [place] [phone] or [phone]  Mobile Crisis Teams Organization         Address  Phone  Notes  [facility], Mobile Crisis Care Unit  [phone]   [facility]  [address] [place], [place]   [place] Levels [address], [place] [phone]    Self-Help/Support Groups Organization         Address  Phone             Notes  [place]. of [place] - variety of support groups  [place]ra[place] Call for more information  Narcotics Anonymous (NA), [facility] [address], [employer] [place]  2 meetings at this location   Special educational needs teacher         Address  Phone  Notes  [facility] Residential Treatment [place],    [place]  [phone]   [facility]  [address], [place] [phone], [place], [place]   Agar [place]ona, [place] [phone] Admissions: 8am-3pm M-F  Incentives Substance [place] [address]    [place], [place] [phone]   [facility] [address] [place], [place], [place]   [facility] [address]  [place], [place]   [facility] Programs - Intensive Outpatient [address], [place], [place]   [facility] [address].,  [place], [place] [phone] or [phone]   [facility] (RTS) [address], [place], [place] Accepts Medicaid  [facility] [place] [address],  [place] [phone] Substance Abuse/Addiction Treatment   [facility] Organization         Address  Phone  Notes  [facility]  [phone]   Domenic Schwab, PhD [address] Arlis Porta [place], [place]   [phone] or [phone]    [place], [place] [phone]   [facility] [address], [place], [place] [phone] Insurance/Medicaid/sponsorship through [facility] and Families [address]                                    [place], [place] [phone] [place] [address], [place] [phone]    Dr. Adele Schilder  [phone]   [facility] Dept. 1) [address], [place] 2) [place] 3)  [place] [address], [place] [phone]  [phone]   [place] [phone] or [phone] (After Hours)

## 2015-07-05 NOTE — ED Provider Notes (Signed)
CSN: [id number]     Arrival date & time 07/05/15  [id number] History   First MD Initiated Contact with Patient 07/05/15 1155     Chief Complaint  Patient presents with  . Abdominal Pain  . Emesis     (Consider location/radiation/quality/duration/timing/severity/associated sxs/prior Treatment) Patient is a 30 y.o. female presenting with abdominal pain and vomiting. The history is provided by the patient and medical records. No language interpreter was used.  Abdominal Pain Associated symptoms: diarrhea and vomiting   Associated symptoms: no chest pain, no constipation, no cough, no dysuria, no fatigue, no fever, no hematuria, no nausea and no shortness of breath   Emesis Associated symptoms: abdominal pain and diarrhea   Associated symptoms: no headaches      Robyn Williams is a 30 y.o. female  with a hx of anemia, kidney stones, UTI, depression presents to the Emergency Department complaining of gradual, persistent, progressively worsening nausea, vomiting and diarrhea. Patient denies hematemesis, melena or hematochezia. She reports that other people in the home have had a 24-hour GI illness. Symptoms began at 1 AM yesterday morning.   She was prior to arrival. Nothing makes the symptoms better and eating induces vomiting. Patient reports usage of Phenergan and Zofran and previous pregnancies for hyperemesis and states that she does not like the way they make her body feel.  She reports subjective fever at home with associated abdominal cramping but no focal abdominal pain. She denies chills, weakness, syncope, dysuria, hematuria.  Past Medical History  Diagnosis Date  . Seizures (HCC)     from Ritalin  . Anemia   . Asthma     with pregnancy  . Chronic kidney disease     kidney stones  . Urinary tract infection   . Depression     doing good   Past Surgical History  Procedure Laterality Date  . No past surgeries     Family History  Problem Relation Age of Onset  . Anesthesia  problems Neg Hx   . Hearing loss Neg Hx    Social History  Substance Use Topics  . Smoking status: Current Some Day Smoker -- 2 years    Types: Cigarettes  . Smokeless tobacco: Never Used  . Alcohol Use: No   OB History    Gravida Para Term Preterm AB TAB SAB Ectopic Multiple Living   5 4 4  0 1 0 1 0 0 4     Review of Systems  Constitutional: Negative for fever, diaphoresis, appetite change, fatigue and unexpected weight change.  HENT: Negative for mouth sores.   Eyes: Negative for visual disturbance.  Respiratory: Negative for cough, chest tightness, shortness of breath and wheezing.   Cardiovascular: Negative for chest pain.  Gastrointestinal: Positive for vomiting, abdominal pain and diarrhea. Negative for nausea and constipation.  Endocrine: Negative for polydipsia, polyphagia and polyuria.  Genitourinary: Negative for dysuria, urgency, frequency and hematuria.  Musculoskeletal: Negative for back pain and neck stiffness.  Skin: Negative for rash.  Allergic/Immunologic: Negative for immunocompromised state.  Neurological: Negative for syncope, light-headedness and headaches.  Hematological: Does not bruise/bleed easily.  Psychiatric/Behavioral: Negative for sleep disturbance. The patient is not nervous/anxious.       Allergies  Peanut-containing drug products and Latex  Home Medications   Prior to Admission medications   Medication Sig Start Date End Date Taking? Authorizing Provider  prochlorperazine (COMPAZINE) 10 MG tablet Take 1 tablet (10 mg total) by mouth 2 (two) times daily as needed for nausea or  vomiting (Nausea ). 07/05/15   Valita Righter, PA-C   BP 109/70 mmHg  Pulse 79  Temp(Src) 98.3 F (36.8 C) (Oral)  Resp 17  Ht 5\' 5"  (1.651 m)  Wt 56.7 kg  BMI 20.80 kg/m2  SpO2 99%  LMP 06/28/2015 Physical Exam  Constitutional: She appears well-developed and well-nourished. No distress.  Awake, alert, nontoxic appearance  HENT:  Head: Normocephalic and  atraumatic.  Mouth/Throat: Oropharynx is clear and moist. Mucous membranes are dry. No oropharyngeal exudate.  Mucous membranes are slightly dry  Eyes: Conjunctivae are normal. No scleral icterus.  Neck: Normal range of motion. Neck supple.  Cardiovascular: Normal rate, regular rhythm and intact distal pulses.   Pulmonary/Chest: Effort normal and breath sounds normal. No respiratory distress. She has no wheezes.  Equal chest expansion  Abdominal: Soft. Bowel sounds are normal. She exhibits no mass. There is no tenderness. There is no rebound and no guarding.  Musculoskeletal: Normal range of motion. She exhibits no edema.  Neurological: She is alert.  Speech is clear and goal oriented Moves extremities without ataxia  Skin: Skin is warm and dry. She is not diaphoretic.  Psychiatric: She has a normal mood and affect.  Nursing note and vitals reviewed.   ED Course  Procedures (including critical care time) Labs Review Labs Reviewed  CBC - Abnormal; Notable for the following:    WBC 2.5 (*)    RBC 3.51 (*)    Hemoglobin 11.8 (*)    MCV 104.0 (*)    All other components within normal limits  LIPASE, BLOOD  COMPREHENSIVE METABOLIC PANEL  URINALYSIS, ROUTINE W REFLEX MICROSCOPIC (NOT AT [facility])  I-STAT BETA HCG BLOOD, ED ([facility], [facility], [facility] ONLY)     MDM   Final diagnoses:  Nausea vomiting and diarrhea   Robyn Williams presents with c/o abd cramping, N/V/D.  Patient with symptoms consistent with gastritis.  Likely viral in nature.  Vitals are stable, no fever or tachycardia.  Patient is nontoxic, nonseptic appearing, in no apparent distress.  Patient does not meet the SIRS or Sepsis criteria.  Pt's symptoms have been managed in the department; fluid bolus given. Signs of mild dehydration, tolerating PO fluids > 6 oz.  Denies all urinary or vaginal symptoms. Lungs are clear.  Pt is feeling better and requests discharge home prior to UA.  She has urinated here in the department, but she does  not want to wait for UA.  Discussed potential for UTI, but doubt this is the primary etiology of her symptoms.  No focal abdominal pain, no peritoneal signs, no concern for appendicitis, cholecystitis, pancreatitis, ruptured viscus, UTI, kidney stone, PID, ectopic pregnancy.  Supportive therapy indicated.  Patient counseled, expresses understanding and agrees with plan.      Dahlia Client Xzavier Swinger, PA-C 07/05/15 1546  Courteney Randall An, MD 07/06/15 1610

## 2015-10-15 ENCOUNTER — Encounter (HOSPITAL_COMMUNITY): Payer: Self-pay | Admitting: *Deleted

## 2015-10-15 ENCOUNTER — Emergency Department (HOSPITAL_COMMUNITY)
Admission: EM | Admit: 2015-10-15 | Discharge: 2015-10-16 | Disposition: A | Discharge status: Home / Self Care | Payer: PRIVATE HEALTH INSURANCE | Attending: Emergency Medicine | Admitting: Emergency Medicine

## 2015-10-15 DIAGNOSIS — L02411 Cutaneous abscess of right axilla: Secondary | ICD-10-CM

## 2015-10-15 DIAGNOSIS — J45909 Unspecified asthma, uncomplicated: Secondary | ICD-10-CM | POA: Insufficient documentation

## 2015-10-15 DIAGNOSIS — F329 Major depressive disorder, single episode, unspecified: Secondary | ICD-10-CM | POA: Insufficient documentation

## 2015-10-15 DIAGNOSIS — N189 Chronic kidney disease, unspecified: Secondary | ICD-10-CM | POA: Insufficient documentation

## 2015-10-15 DIAGNOSIS — L732 Hidradenitis suppurativa: Secondary | ICD-10-CM | POA: Insufficient documentation

## 2015-10-15 DIAGNOSIS — F17210 Nicotine dependence, cigarettes, uncomplicated: Secondary | ICD-10-CM | POA: Insufficient documentation

## 2015-10-15 NOTE — ED Notes (Signed)
Patient is alert and oriented x4.  She is complaining of a boil under the right arm.  She has A past Hx of this issue.  Currently she rates her pain 10 of 10.

## 2015-10-16 MED ORDER — HYDROCODONE-ACETAMINOPHEN 5-325 MG PO TABS
1.0000 | ORAL_TABLET | Freq: Once | ORAL | Status: AC
  Administered 2015-10-16: 1 via ORAL
  Filled 2015-10-16: qty 1

## 2015-10-16 MED ORDER — HYDROCODONE-ACETAMINOPHEN 5-325 MG PO TABS: 1.0000 | ORAL_TABLET | ORAL | Status: DC | PRN

## 2015-10-16 MED ORDER — CEPHALEXIN 500 MG PO CAPS: 500.0000 mg | ORAL_CAPSULE | Freq: Four times a day (QID) | ORAL | Status: DC

## 2015-10-16 MED ORDER — CEPHALEXIN 500 MG PO CAPS
500.0000 mg | ORAL_CAPSULE | Freq: Once | ORAL | Status: AC
  Administered 2015-10-16: 500 mg via ORAL
  Filled 2015-10-16: qty 1

## 2015-10-16 NOTE — Discharge Instructions (Signed)
Abscess  An abscess is an infected area that contains a collection of pus and debris.It can occur in almost any part of the body. An abscess is also known as a furuncle or boil.  CAUSES   An abscess occurs when tissue gets infected. This can occur from blockage of oil or sweat glands, infection of hair follicles, or a minor injury to the skin. As the body tries to fight the infection, pus collects in the area and creates pressure under the skin. This pressure causes pain. People with weakened immune systems have difficulty fighting infections and get certain abscesses more often.   SYMPTOMS  Usually an abscess develops on the skin and becomes a painful mass that is red, warm, and tender. If the abscess forms under the skin, you may feel a moveable soft area under the skin. Some abscesses break open (rupture) on their own, but most will continue to get worse without care. The infection can spread deeper into the body and eventually into the bloodstream, causing you to feel ill.   DIAGNOSIS   Your caregiver will take your medical history and perform a physical exam. A sample of fluid may also be taken from the abscess to determine what is causing your infection.  TREATMENT   Your caregiver may prescribe antibiotic medicines to fight the infection. However, taking antibiotics alone usually does not cure an abscess. Your caregiver may need to make a small cut (incision) in the abscess to drain the pus. In some cases, gauze is packed into the abscess to reduce pain and to continue draining the area.  HOME CARE INSTRUCTIONS    Only take over-the-counter or prescription medicines for pain, discomfort, or fever as directed by your caregiver.   If you were prescribed antibiotics, take them as directed. Finish them even if you start to feel better.   If gauze is used, follow your caregiver's directions for changing the gauze.   To avoid spreading the infection:    Keep your draining abscess covered with a bandage.     Wash your hands well.    Do not share personal care items, towels, or whirlpools with others.    Avoid skin contact with others.   Keep your skin and clothes clean around the abscess.   Keep all follow-up appointments as directed by your caregiver.  SEEK MEDICAL CARE IF:    You have increased pain, swelling, redness, fluid drainage, or bleeding.   You have muscle aches, chills, or a general ill feeling.   You have a fever.  MAKE SURE YOU:    Understand these instructions.   Will watch your condition.   Will get help right away if you are not doing well or get worse.     This information is not intended to replace advice given to you by your health care provider. Make sure you discuss any questions you have with your health care provider.     Document Released: 03/25/2005 Document Revised: 12/15/2011 Document Reviewed: 08/28/2011  Elsevier Interactive Patient Education 2016 Elsevier Inc.  Hidradenitis Suppurativa  Hidradenitis suppurativa is a long-term (chronic) skin disease that starts with blocked sweat glands or hair follicles. Bacteria may grow in these blocked openings of your skin. Hidradenitis suppurativa is like a severe form of acne that develops in areas of your body where acne would be unusual. It is most likely to affect the areas of your body where skin rubs against skin and becomes moist. This includes your:   Underarms.     Groin.   Genital areas.   Buttocks.   Upper thighs.   Breasts.  Hidradenitis suppurativa may start out with small pimples. The pimples can develop into deep sores that break open (rupture) and drain pus. Over time your skin may thicken and become scarred. Hidradenitis suppurativa cannot be passed from person to person.   CAUSES   The exact cause of hidradenitis suppurativa is not known. This condition may be due to:   Female and female hormones. The condition is rare before and after puberty.   An overactive body defense system (immune system). Your immune system may  overreact to the blocked hair follicles or sweat glands and cause swelling and pus-filled sores.  RISK FACTORS  You may have a higher risk of hidradenitis suppurativa if you:   Are a woman.   Are between ages 11 and 55.   Have a family history of hidradenitis suppurativa.   Have a personal history of acne.   Are overweight.   Smoke.   Take the drug lithium.  SIGNS AND SYMPTOMS   The first signs of an outbreak are usually painful skin bumps that look like pimples. As the condition progresses:   Skin bumps may get bigger and grow deeper into the skin.   Bumps under the skin may rupture and drain smelly pus.   Skin may become itchy and infected.   Skin may thicken and scar.   Drainage may continue through tunnels under the skin (fistulas).   Walking and moving your arms can become painful.  DIAGNOSIS   Your health care provider may diagnose hidradenitis suppurativa based on your medical history and your signs and symptoms. A physical exam will also be done. You may need to see a health care provider who specializes in skin diseases (dermatologist). You may also have tests done to confirm the diagnosis. These can include:   Swabbing a sample of pus or drainage from your skin so it can be sent to the lab and tested for infection.   Blood tests to check for infection.  TREATMENT   The same treatment will not work for everybody with hidradenitis suppurativa. Your treatment will depend on how severe your symptoms are. You may need to try several treatments to find what works best for you. Part of your treatment may include cleaning and bandaging (dressing) your wounds. You may also have to take medicines, such as the following:   Antibiotics.   Acne medicines.   Medicines to block or suppress the immune system.   A diabetes medicine (metformin) is sometimes used to treat this condition.   For women, birth control pills can sometimes help relieve symptoms.  You may need surgery if you have a severe case  of hidradenitis suppurativa that does not respond to medicine. Surgery may involve:    Using a laser to clear the skin and remove hair follicles.   Opening and draining deep sores.   Removing the areas of skin that are diseased and scarred.  HOME CARE INSTRUCTIONS   Learn as much as you can about your disease, and work closely with your health care providers.   Take medicines only as directed by your health care provider.   If you were prescribed an antibiotic medicine, finish it all even if you start to feel better.   If you are overweight, losing weight may be very helpful. Try to reach and maintain a healthy weight.   Do not use any tobacco products, including cigarettes, chewing tobacco, or   electronic cigarettes. If you need help quitting, ask your health care provider.   Do not shave the areas where you get hidradenitis suppurativa.   Do not wear deodorant.   Wear loose-fitting clothes.   Try not to overheat and get sweaty.   Take a daily bleach bath as directed by your health care provider.   Fill your bathtub halfway with water.   Pour in  cup of unscented household bleach.   Soak for 5-10 minutes.   Cover sore areas with a warm, clean washcloth (compress) for 5-10 minutes.  SEEK MEDICAL CARE IF:    You have a flare-up of hidradenitis suppurativa.   You have chills or a fever.   You are having trouble controlling your symptoms at home.     This information is not intended to replace advice given to you by your health care provider. Make sure you discuss any questions you have with your health care provider.     Document Released: 01/28/2004 Document Revised: 07/06/2014 Document Reviewed: 09/15/2013  Elsevier Interactive Patient Education 2016 Elsevier Inc.

## 2015-10-16 NOTE — ED Provider Notes (Signed)
CSN: [id number]     Arrival date & time 10/15/15  2136 History   First MD Initiated Contact with Patient 10/16/15 0047     Chief Complaint  Patient presents with  . Abscess     (Consider location/radiation/quality/duration/timing/severity/associated sxs/prior Treatment) HPI Comments: Patient presents with recurrent axillary abscess that is spontaneously draining. Symptoms for the past 2 weeks without fever. She reports a history of hidradenitis suppurativa and previous consultation with surgeon.   The history is provided by the patient. No language interpreter was used.    Past Medical History  Diagnosis Date  . Seizures (HCC)     from Ritalin  . Anemia   . Asthma     with pregnancy  . Chronic kidney disease     kidney stones  . Urinary tract infection   . Depression     doing good   Past Surgical History  Procedure Laterality Date  . No past surgeries     Family History  Problem Relation Age of Onset  . Anesthesia problems Neg Hx   . Hearing loss Neg Hx    Social History  Substance Use Topics  . Smoking status: Current Some Day Smoker -- 2 years    Types: Cigarettes  . Smokeless tobacco: Never Used  . Alcohol Use: No   OB History    Gravida Para Term Preterm AB TAB SAB Ectopic Multiple Living   5 4 4  0 1 0 1 0 0 4     Review of Systems  Constitutional: Negative for fever.  Cardiovascular: Negative for chest pain.  Gastrointestinal: Negative for nausea.  Musculoskeletal: Negative for myalgias.  Skin:       Complains of painful boil in right axilla, recurrent.      Allergies  Peanut-containing drug products and Latex  Home Medications   Prior to Admission medications   Medication Sig Start Date End Date Taking? Authorizing Provider  prochlorperazine (COMPAZINE) 10 MG tablet Take 1 tablet (10 mg total) by mouth 2 (two) times daily as needed for nausea or vomiting (Nausea ). 07/05/15   Dahlia ClientHannah Muthersbaugh, PA-C   BP 106/72 mmHg  Pulse 96  Temp(Src)  98.7 F (37.1 C) (Oral)  Resp 18  SpO2 100%  LMP 09/16/2015 Physical Exam  Constitutional: She is oriented to person, place, and time. She appears well-developed and well-nourished.  Neck: Normal range of motion.  Pulmonary/Chest: Effort normal.  Neurological: She is alert and oriented to person, place, and time.  Skin: Skin is warm and dry.  Right axilla swollen without surrounding cellulitis. Multiple cutaneous defects presumed to be sites of previous abscesses.    ED Course  Procedures (including critical care time) Labs Review Labs Reviewed - No data to display  Imaging Review No results found. I have personally reviewed and evaluated these images and lab results as part of my medical decision-making.   EKG Interpretation None      MDM   Final diagnoses:  None    1. Hidradenitis suppurativa  Will refer to surgeon for further management. Keflex and Norco provided.     Elpidio AnisShari Revanth Neidig, PA-C 10/16/15 0119  Paula LibraJohn Molpus, MD 10/16/15 [id number]

## 2015-10-21 ENCOUNTER — Emergency Department (HOSPITAL_COMMUNITY)
Admission: EM | Admit: 2015-10-21 | Discharge: 2015-10-21 | Disposition: A | Discharge status: Home / Self Care | Payer: PRIVATE HEALTH INSURANCE | Attending: Emergency Medicine | Admitting: Emergency Medicine

## 2015-10-21 ENCOUNTER — Encounter (HOSPITAL_COMMUNITY): Payer: Self-pay | Admitting: Emergency Medicine

## 2015-10-21 DIAGNOSIS — F17210 Nicotine dependence, cigarettes, uncomplicated: Secondary | ICD-10-CM | POA: Insufficient documentation

## 2015-10-21 DIAGNOSIS — Z79899 Other long term (current) drug therapy: Secondary | ICD-10-CM | POA: Insufficient documentation

## 2015-10-21 DIAGNOSIS — Z91010 Allergy to peanuts: Secondary | ICD-10-CM | POA: Insufficient documentation

## 2015-10-21 DIAGNOSIS — Z79891 Long term (current) use of opiate analgesic: Secondary | ICD-10-CM | POA: Insufficient documentation

## 2015-10-21 DIAGNOSIS — L0291 Cutaneous abscess, unspecified: Secondary | ICD-10-CM

## 2015-10-21 DIAGNOSIS — Z91040 Latex allergy status: Secondary | ICD-10-CM | POA: Insufficient documentation

## 2015-10-21 DIAGNOSIS — L732 Hidradenitis suppurativa: Secondary | ICD-10-CM

## 2015-10-21 DIAGNOSIS — N189 Chronic kidney disease, unspecified: Secondary | ICD-10-CM | POA: Insufficient documentation

## 2015-10-21 DIAGNOSIS — G40909 Epilepsy, unspecified, not intractable, without status epilepticus: Secondary | ICD-10-CM | POA: Insufficient documentation

## 2015-10-21 DIAGNOSIS — F329 Major depressive disorder, single episode, unspecified: Secondary | ICD-10-CM | POA: Insufficient documentation

## 2015-10-21 DIAGNOSIS — J45909 Unspecified asthma, uncomplicated: Secondary | ICD-10-CM | POA: Insufficient documentation

## 2015-10-21 DIAGNOSIS — Z792 Long term (current) use of antibiotics: Secondary | ICD-10-CM | POA: Insufficient documentation

## 2015-10-21 MED ORDER — HYDROCODONE-ACETAMINOPHEN 5-325 MG PO TABS
1.0000 | ORAL_TABLET | Freq: Once | ORAL | Status: AC
  Administered 2015-10-21: 1 via ORAL
  Filled 2015-10-21: qty 1

## 2015-10-21 MED ORDER — SULFAMETHOXAZOLE-TRIMETHOPRIM 800-160 MG PO TABS: 1.0000 | ORAL_TABLET | Freq: Two times a day (BID) | ORAL | Status: AC

## 2015-10-21 MED ORDER — MUPIROCIN CALCIUM 2 % NA OINT: TOPICAL_OINTMENT | NASAL | Status: DC

## 2015-10-21 MED ORDER — TRAMADOL HCL 50 MG PO TABS: 50.0000 mg | ORAL_TABLET | Freq: Four times a day (QID) | ORAL | Status: DC | PRN

## 2015-10-21 NOTE — ED Provider Notes (Signed)
CSN: [id number]     Arrival date & time 10/21/15  1701 History  By signing my name below, I, Octavia Heir , attest that this documentation has been prepared under the direction and in the presence of [employer], PA-C. Electronically Signed: Octavia Heir, ED Scribe. 10/21/2015. 7:04 PM.   Chief Complaint  Patient presents with  . Abscess   The history is provided by the patient. No language interpreter was used.    HPI Comments: Robyn Williams is a 30 y.o. female who has a hx of hidradenitis suppurativa presents to the Emergency Department complaining of an intermittent, recurrent, gradual worsening, abscess under her right arm onset two weeks ago. Pt states she was seen last week for the same abscess but did not have it lanced. Pt has been taking antibiotics given but it has not gotten any better. She has seen a surgeon for her problem 5 years ago and he recommended no surgery as well as not having it lanced. Pt has been taking ibuprofen to alleviate her pain as well as warm compresses to alleviate the abscess with no relief. Pt has a hx of cancer in her family and she is concerned it may be cancerous because it has lasted longer than her normal abscesses.  Past Medical History  Diagnosis Date  . Seizures (HCC)     from Ritalin  . Anemia   . Asthma     with pregnancy  . Chronic kidney disease     kidney stones  . Urinary tract infection   . Depression     doing good   Past Surgical History  Procedure Laterality Date  . No past surgeries     Family History  Problem Relation Age of Onset  . Anesthesia problems Neg Hx   . Hearing loss Neg Hx    Social History  Substance Use Topics  . Smoking status: Current Some Day Smoker -- 2 years    Types: Cigarettes  . Smokeless tobacco: Never Used  . Alcohol Use: No   OB History    Gravida Para Term Preterm AB TAB SAB Ectopic Multiple Living   0 1 0 1 0 0 4     Review of Systems A complete 10 system review of  systems was obtained and all systems are negative except as noted in the HPI and PMH.   Allergies  Amoxicillin; Peanut-containing drug products; and Latex  Home Medications   Prior to Admission medications   Medication Sig Start Date End Date Taking? Authorizing Provider  cephALEXin (KEFLEX) 500 MG capsule Take 1 capsule (500 mg total) by mouth 4 (four) times daily. 10/16/15   Elpidio Anis, PA-C  HYDROcodone-acetaminophen (NORCO/VICODIN) 5-325 MG tablet Take 1-2 tablets by mouth every 4 (four) hours as needed. 10/16/15   Elpidio Anis, PA-C  prochlorperazine (COMPAZINE) 10 MG tablet Take 1 tablet (10 mg total) by mouth 2 (two) times daily as needed for nausea or vomiting (Nausea ). 07/05/15   Dahlia Client Muthersbaugh, PA-C   Triage vitals: BP 124/73 mmHg  Pulse 89  Temp(Src) 98.5 F (36.9 C) (Oral)  Resp 16  SpO2 100%  LMP 09/16/2015    Physical Exam  Constitutional: She is oriented to person, place, and time. She appears well-developed and well-nourished.  HENT:  Head: Normocephalic and atraumatic.  Eyes: EOM are normal.  Neck: Normal range of motion.  Cardiovascular: Normal rate.   Pulmonary/Chest: Effort normal. No respiratory distress.  Abdominal: Soft.  Musculoskeletal: Normal range  of motion.  Neurological: She is alert and oriented to person, place, and time.  Skin: Skin is warm and dry.  2 cm fluctuant mass with surrounding erythema in right axilla. Mild purulent drainage noted. No streaking. No lymphadenopathy.  Psychiatric: She has a normal mood and affect.  Nursing note and vitals reviewed.   ED Course  Procedures  DIAGNOSTIC STUDIES: Oxygen Saturation is 100% on RA, normal by my interpretation.    COORDINATION OF CARE: 7:00 PM Discussed next steps with pt. She verbalized understanding and is agreeable with the plan. Pt verbally denied having the area lanced.  Labs Review Labs Reviewed - No data to display  Imaging Review No results found. I have personally  reviewed and evaluated these images and lab results as part of my medical decision-making.   EKG Interpretation None      MDM   Final diagnoses:  Hidradenitis suppurativa of right axilla  Abscess    Patient process with abscess and right axilla secondary to hidradenitis suppurativa. Patient is refusing incision and drainage at this time as she states that a surgeon 5 years ago told her that this would not help her. She was seen in the ED on 10/16/15 for similar symptoms and was given Keflex without relief. There is no sign of spread of infection on exam but abscess is still present. Will give patient Bactrim and mupirocin ointment for her nasal passages. Also recommended she be with Hibiclens 2-3 times per week. Recommend follow-up with general surgery for second opinon. Feel that pt likely needs surgery for intervention. Case management came to see patient as she does not have health insurance.  I personally performed the services described in this documentation, which was scribed in my presence. The recorded information has been reviewed and is accurate.     Lester KinsmanSamantha Tripp BoringDowless, PA-C 10/21/15 1954  Lyndal Pulleyaniel Knott, MD 10/22/15 [phone]

## 2015-10-21 NOTE — Progress Notes (Addendum)
EDCM spoke to patient at bedside. Patient confirms she [place] a [place]cp [place]  [facility], informed patient of services there.  EDCM also provided patient with list of pcps who accept self pay patients, list of discount pharmacies and websites needymeds.org and GoodRX.com for medication assistance, phone number to inquire about the orange card, phone number to inquire about Mediciad, phone number to inquire about the Affordable Care Act, financial resources in the community such as local churches, [employer], urban ministries, and dental assistance for uninsured patients.  Patient thankful for resources.  No further EDCM needs at this time.  [facility].  Provided phone number [phone] for call back.  Patient will need appointment with financial counselor for orange card.  Discussed with EDPA who reports patient will be [facility]  Patient made aware.  [facility] provided patient with coupon for Bactroban from [email or web address].

## 2015-10-21 NOTE — ED Notes (Signed)
Pt's chart was accessed to verify referral.

## 2015-10-21 NOTE — ED Notes (Addendum)
Pt was seen on 4/11 for a recurrent abscess under her R arm. Pt was given antibiotics and pain medication and pt sts it has only gotten worse. Pt sts she has a condition that makes it impossible for her to have them lanced. Pt A&Ox4 and ambulatory. Pt has been out of work because of the Abscess. Pt concerned it is cancerous because this is different than her usual abscesses.

## 2015-10-21 NOTE — Discharge Instructions (Signed)
Hidradenitis Suppurativa Hidradenitis suppurativa is a long-term (chronic) skin disease that starts with blocked sweat glands or hair follicles. Bacteria may grow in these blocked openings of your skin. Hidradenitis suppurativa is like a severe form of acne that develops in areas of your body where acne would be unusual. It is most likely to affect the areas of your body where skin rubs against skin and becomes moist. This includes your:  Underarms.  Groin.  Genital areas.  Buttocks.  Upper thighs.  Breasts. Hidradenitis suppurativa may start out with small pimples. The pimples can develop into deep sores that break open (rupture) and drain pus. Over time your skin may thicken and become scarred. Hidradenitis suppurativa cannot be passed from person to person.  CAUSES  The exact cause of hidradenitis suppurativa is not known. This condition may be due to:  Female and female hormones. The condition is rare before and after puberty.  An overactive body defense system (immune system). Your immune system may overreact to the blocked hair follicles or sweat glands and cause swelling and pus-filled sores. RISK FACTORS You may have a higher risk of hidradenitis suppurativa if you:  Are a woman.  Are between ages 11 and 9.  Have a family history of hidradenitis suppurativa.  Have a personal history of acne.  Are overweight.  Smoke.  Take the drug lithium. SIGNS AND SYMPTOMS  The first signs of an outbreak are usually painful skin bumps that look like pimples. As the condition progresses:  Skin bumps may get bigger and grow deeper into the skin.  Bumps under the skin may rupture and drain smelly pus.  Skin may become itchy and infected.  Skin may thicken and scar.  Drainage may continue through tunnels under the skin (fistulas).  Walking and moving your arms can become painful. DIAGNOSIS  Your health care provider may diagnose hidradenitis suppurativa based on your medical  history and your signs and symptoms. A physical exam will also be done. You may need to see a health care provider who specializes in skin diseases (dermatologist). You may also have tests done to confirm the diagnosis. These can include:  Swabbing a sample of pus or drainage from your skin so it can be sent to the lab and tested for infection.  Blood tests to check for infection. TREATMENT  The same treatment will not work for everybody with hidradenitis suppurativa. Your treatment will depend on how severe your symptoms are. You may need to try several treatments to find what works best for you. Part of your treatment may include cleaning and bandaging (dressing) your wounds. You may also have to take medicines, such as the following:  Antibiotics.  Acne medicines.  Medicines to block or suppress the immune system.  A diabetes medicine (metformin) is sometimes used to treat this condition.  For women, birth control pills can sometimes help relieve symptoms. You may need surgery if you have a severe case of hidradenitis suppurativa that does not respond to medicine. Surgery may involve:   Using a laser to clear the skin and remove hair follicles.  Opening and draining deep sores.  Removing the areas of skin that are diseased and scarred. HOME CARE INSTRUCTIONS  Learn as much as you can about your disease, and work closely with your health care providers.  Take medicines only as directed by your health care provider.  If you were prescribed an antibiotic medicine, finish it all even if you start to feel better.  If you are  overweight, losing weight may be very helpful. Try to reach and maintain a healthy weight.  Do not use any tobacco products, including cigarettes, chewing tobacco, or electronic cigarettes. If you need help quitting, ask your health care provider.  Do not shave the areas where you get hidradenitis suppurativa.  Do not wear deodorant.  Wear loose-fitting  clothes.  Try not to overheat and get sweaty.  Take a daily bleach bath as directed by your health care provider.  Fill your bathtub halfway with water.  Pour in  cup of unscented household bleach.  Soak for 5-10 minutes.  Cover sore areas with a warm, clean washcloth (compress) for 5-10 minutes. SEEK MEDICAL CARE IF:   You have a flare-up of hidradenitis suppurativa.  You have chills or a fever.  You are having trouble controlling your symptoms at home.   This information is not intended to replace advice given to you by your health care provider. Make sure you discuss any questions you have with your health care provider.   Document Released: 01/28/2004 Document Revised: 07/06/2014 Document Reviewed: 09/15/2013 Elsevier Interactive Patient Education 2016 [employer]  Abscess An abscess is an infected area that contains a collection of pus and debris.It can occur in almost any part of the body. An abscess is also known as a furuncle or boil. CAUSES  An abscess occurs when tissue gets infected. This can occur from blockage of oil or sweat glands, infection of hair follicles, or a minor injury to the skin. As the body tries to fight the infection, pus collects in the area and creates pressure under the skin. This pressure causes pain. People with weakened immune systems have difficulty fighting infections and get certain abscesses more often.  SYMPTOMS Usually an abscess develops on the skin and becomes a painful mass that is red, warm, and tender. If the abscess forms under the skin, you may feel a moveable soft area under the skin. Some abscesses break open (rupture) on their own, but most will continue to get worse without care. The infection can spread deeper into the body and eventually into the bloodstream, causing you to feel ill.  DIAGNOSIS  Your caregiver will take your medical history and perform a physical exam. A sample of fluid may also be taken from the abscess to  determine what is causing your infection. TREATMENT  Your caregiver may prescribe antibiotic medicines to fight the infection. However, taking antibiotics alone usually does not cure an abscess. Your caregiver may need to make a small cut (incision) in the abscess to drain the pus. In some cases, gauze is packed into the abscess to reduce pain and to continue draining the area. HOME CARE INSTRUCTIONS   Only take over-the-counter or prescription medicines for pain, discomfort, or fever as directed by your caregiver.  If you were prescribed antibiotics, take them as directed. Finish them even if you start to feel better.  If gauze is used, follow your caregiver's directions for changing the gauze.  To avoid spreading the infection:  Keep your draining abscess covered with a bandage.  Wash your hands well.  Do not share personal care items, towels, or whirlpools with others.  Avoid skin contact with others.  Keep your skin and clothes clean around the abscess.  Keep all follow-up appointments as directed by your caregiver. SEEK MEDICAL CARE IF:   You have increased pain, swelling, redness, fluid drainage, or bleeding.  You have muscle aches, chills, or a general ill feeling.  You  have a fever. MAKE SURE YOU:   Understand these instructions.  Will watch your condition.  Will get help right away if you are not doing well or get worse.   This information is not intended to replace advice given to you by your health care provider. Make sure you discuss any questions you have with your health care provider.   Follow-up with general surgery as soon as possible for consultation and reevaluation. Based with Hibiclens 2-3 times a week. He may purchase this at your local pharmacy. Take antibiotics as prescribed. Apply mupirocin ointment in your nostril daily. Return to the ED if you experience severe worsening of her symptoms, increased swelling or pain in your arm pit, fever, chills,  increased redness.

## 2015-10-22 ENCOUNTER — Telehealth: Payer: Self-pay

## 2015-10-22 NOTE — Telephone Encounter (Signed)
Message received from Radford PaxAmy Ferrero, RN CM requesting [facility] as well as an appointment with a financial counselor. Call placed to # [phone] (H) and a HIPAA compliant voice mail message was left requesting a call back to # [phone] or [phone].   Update provided to A. Bennie DallasFerrero, RN CM

## 2015-10-24 ENCOUNTER — Telehealth: Payer: Self-pay

## 2015-10-24 NOTE — Telephone Encounter (Signed)
[facility]:  This Case Manager received communication from Radford PaxAmy Ferrero, RN CM indicating patient needing a hospital follow-up appointment [facility] as well as an appointment with Artistinancial Counselor.  Call placed to #[phone]; unable to reach patient.  HIPPA compliant voicemail left requesting return call.

## 2015-10-28 ENCOUNTER — Telehealth: Payer: Self-pay

## 2015-10-28 NOTE — Telephone Encounter (Signed)
Attempted to contact the patient to discuss scheduling a follow [facility] appointment [facility]. Call placed to # [phone] (H) and a HIPAA compliant voice mail message was left requesting a call back to # [phone] or [phone].

## 2015-10-30 ENCOUNTER — Telehealth: Payer: Self-pay

## 2015-10-30 NOTE — Telephone Encounter (Signed)
Attempted again to contact the patient to discuss scheduling a follow up appointment. Call placed to #[phone] (H) and a HIPAA compliant voice mail message was left requesting a call back to # [phone] or [phone].

## 2016-01-29 ENCOUNTER — Emergency Department (HOSPITAL_COMMUNITY)
Admission: EM | Admit: 2016-01-29 | Discharge: 2016-01-29 | Disposition: A | Discharge status: Home / Self Care | Payer: Self-pay | Attending: Dermatology | Admitting: Dermatology

## 2016-01-29 ENCOUNTER — Emergency Department (HOSPITAL_COMMUNITY): Payer: Self-pay

## 2016-01-29 ENCOUNTER — Encounter (HOSPITAL_COMMUNITY): Payer: Self-pay | Admitting: Emergency Medicine

## 2016-01-29 DIAGNOSIS — Z5321 Procedure and treatment not carried out due to patient leaving prior to being seen by health care provider: Secondary | ICD-10-CM | POA: Insufficient documentation

## 2016-01-29 DIAGNOSIS — R079 Chest pain, unspecified: Secondary | ICD-10-CM | POA: Insufficient documentation

## 2016-01-29 LAB — URINALYSIS, ROUTINE W REFLEX MICROSCOPIC
Bilirubin Urine: NEGATIVE
Glucose, UA: NEGATIVE mg/dL
Hgb urine dipstick: NEGATIVE
Ketones, ur: 15 mg/dL — AB
Nitrite: NEGATIVE
Protein, ur: NEGATIVE mg/dL
Specific Gravity, Urine: 1.027 (ref 1.005–1.030)
pH: 8 (ref 5.0–8.0)

## 2016-01-29 LAB — BASIC METABOLIC PANEL WITH GFR
Anion gap: 4 — ABNORMAL LOW (ref 5–15)
BUN: 9 mg/dL (ref 6–20)
CO2: 27 mmol/L (ref 22–32)
Calcium: 9.4 mg/dL (ref 8.9–10.3)
Chloride: 106 mmol/L (ref 101–111)
Creatinine, Ser: 0.67 mg/dL (ref 0.44–1.00)
GFR calc Af Amer: >60 mL/min
GFR calc non Af Amer: >60 mL/min
Glucose, Bld: 95 mg/dL (ref 65–99)
Potassium: 3.5 mmol/L (ref 3.5–5.1)
Sodium: 137 mmol/L (ref 135–145)

## 2016-01-29 LAB — URINE MICROSCOPIC-ADD ON

## 2016-01-29 LAB — CBC
HCT: 41.6 % (ref 36.0–46.0)
Hemoglobin: 13.5 g/dL (ref 12.0–15.0)
MCH: 34.2 pg — ABNORMAL HIGH (ref 26.0–34.0)
MCHC: 32.5 g/dL (ref 30.0–36.0)
MCV: 105.3 fL — ABNORMAL HIGH (ref 78.0–100.0)
Platelets: 230 K/uL (ref 150–400)
RBC: 3.95 MIL/uL (ref 3.87–5.11)
RDW: 12.6 % (ref 11.5–15.5)
WBC: 7.2 K/uL (ref 4.0–10.5)

## 2016-01-29 LAB — I-STAT TROPONIN, ED: TROPONIN I, POC: 0 ng/mL (ref 0.00–0.08)

## 2016-01-29 LAB — POC URINE PREG, ED: PREG TEST UR: NEGATIVE

## 2016-01-29 NOTE — ED Notes (Signed)
Patient left without being seen.

## 2016-01-29 NOTE — ED Triage Notes (Signed)
Pt. reports central chest pain with mild SOB , nausea and emesis , pt. added low abdominal pain onset last week with diarrhea .

## 2016-02-05 ENCOUNTER — Emergency Department
Admission: EM | Admit: 2016-02-05 | Discharge: 2016-02-05 | Disposition: A | Discharge status: Home / Self Care | Payer: PRIVATE HEALTH INSURANCE | Attending: Emergency Medicine | Admitting: Emergency Medicine

## 2016-02-05 ENCOUNTER — Encounter: Payer: Self-pay | Admitting: *Deleted

## 2016-02-05 DIAGNOSIS — J45909 Unspecified asthma, uncomplicated: Secondary | ICD-10-CM | POA: Insufficient documentation

## 2016-02-05 DIAGNOSIS — Z792 Long term (current) use of antibiotics: Secondary | ICD-10-CM | POA: Insufficient documentation

## 2016-02-05 DIAGNOSIS — F17210 Nicotine dependence, cigarettes, uncomplicated: Secondary | ICD-10-CM | POA: Insufficient documentation

## 2016-02-05 DIAGNOSIS — N189 Chronic kidney disease, unspecified: Secondary | ICD-10-CM | POA: Insufficient documentation

## 2016-02-05 DIAGNOSIS — Z791 Long term (current) use of non-steroidal anti-inflammatories (NSAID): Secondary | ICD-10-CM | POA: Insufficient documentation

## 2016-02-05 DIAGNOSIS — N939 Abnormal uterine and vaginal bleeding, unspecified: Secondary | ICD-10-CM

## 2016-02-05 DIAGNOSIS — A599 Trichomoniasis, unspecified: Secondary | ICD-10-CM | POA: Insufficient documentation

## 2016-02-05 LAB — CHLAMYDIA/NGC RT PCR (ARMC ONLY)
CHLAMYDIA TR: NOT DETECTED
N gonorrhoeae: NOT DETECTED

## 2016-02-05 LAB — WET PREP, GENITAL
CLUE CELLS WET PREP: NONE SEEN
Sperm: NONE SEEN
Yeast Wet Prep HPF POC: NONE SEEN

## 2016-02-05 LAB — CBC
HCT: 42.6 % (ref 35.0–47.0)
HEMOGLOBIN: 14.8 g/dL (ref 12.0–16.0)
MCH: 35.4 pg — AB (ref 26.0–34.0)
MCHC: 34.7 g/dL (ref 32.0–36.0)
MCV: 101.8 fL — AB (ref 80.0–100.0)
Platelets: 188 10*3/uL (ref 150–440)
RBC: 4.19 MIL/uL (ref 3.80–5.20)
RDW: 13 % (ref 11.5–14.5)
WBC: 6.3 10*3/uL (ref 3.6–11.0)

## 2016-02-05 LAB — POCT PREGNANCY, URINE: Preg Test, Ur: NEGATIVE

## 2016-02-05 MED ORDER — AZITHROMYCIN 250 MG PO TABS
1000.0000 mg | ORAL_TABLET | Freq: Once | ORAL | Status: AC
  Administered 2016-02-05: 1000 mg via ORAL

## 2016-02-05 MED ORDER — CEFTRIAXONE SODIUM 250 MG IJ SOLR
250.0000 mg | Freq: Once | INTRAMUSCULAR | Status: AC
Start: 2016-02-05 — End: 2016-02-05
  Administered 2016-02-05: 250 mg via INTRAMUSCULAR
  Filled 2016-02-05: qty 250

## 2016-02-05 MED ORDER — METRONIDAZOLE 500 MG PO TABS
500.0000 mg | ORAL_TABLET | Freq: Once | ORAL | Status: AC
  Administered 2016-02-05: 500 mg via ORAL
  Filled 2016-02-05: qty 1

## 2016-02-05 MED ORDER — AZITHROMYCIN 1 G PO PACK: 1.0000 g | PACK | Freq: Once | ORAL | Status: DC

## 2016-02-05 MED ORDER — METRONIDAZOLE 500 MG PO TABS: 500.0000 mg | ORAL_TABLET | Freq: Two times a day (BID) | ORAL | 0 refills | Status: AC

## 2016-02-05 MED ORDER — AZITHROMYCIN 250 MG PO TABS
ORAL_TABLET | ORAL | Status: AC
Start: 2016-02-05 — End: 2016-02-05
  Administered 2016-02-05: 1000 mg via ORAL
  Filled 2016-02-05: qty 4

## 2016-02-05 NOTE — Discharge Instructions (Signed)
As we discussed please follow-up with an OB/GYN or your health Department as soon as possible for a pelvic examination and Pap smear. Please take your anabolic for their entire course. Return to the emergency department for any personally concerning symptoms.

## 2016-02-05 NOTE — ED Triage Notes (Signed)
Pt reports vaginal bleeding since 01/14/16, pt denies pain or any other symptoms

## 2016-02-05 NOTE — ED Provider Notes (Signed)
[facility] Emergency Department Provider Note  Time seen: 6:20 PM  I have reviewed the triage vital signs and the nursing notes.   HISTORY  Chief Complaint Vaginal Bleeding    HPI Robyn Williams is a 30 y.o. female who presents to the emergency department vaginal bleeding. According to the patient she had her period last month, states they normally last 3 days but this one lasted 8 days. For the past 2 weeks she states she has had intermittent bleeding mostly after having intercourse. She also states her husband were recently separated, and now they're once again sexually active and she is concerned that she might have contracted an STD. Patient denies any known vaginal discharge but states it is hard to tell with the intermittent vaginal bleeding. Denies any abdominal pain. Denies any lightheadedness.  Past Medical History:  Diagnosis Date  . Anemia   . Asthma    with pregnancy  . Chronic kidney disease    kidney stones  . Depression    doing good  . 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 Urinary tract infection     There are no active problems to display for this patient.   Past Surgical History:  Procedure Laterality Date  . NO PAST SURGERIES      Prior to Admission medications   Medication Sig Start Date End Date Taking? Authorizing Provider  cephALEXin (KEFLEX) 500 MG capsule Take 1 capsule (500 mg total) by mouth 4 (four) times daily. 10/16/15   Elpidio Anis, PA-C  HYDROcodone-acetaminophen (NORCO/VICODIN) 5-325 MG tablet Take 1-2 tablets by mouth every 4 (four) hours as needed. 10/16/15   Elpidio Anis, PA-C  mupirocin nasal ointment (BACTROBAN) 2 % Apply in each nostril daily 10/21/15   Samantha Tripp Dowless, PA-C  prochlorperazine (COMPAZINE) 10 MG tablet Take 1 tablet (10 mg total) by mouth 2 (two) times daily as needed for nausea or vomiting (Nausea ). 07/05/15   Dahlia Client Muthersbaugh, PA-C  traMADol (ULTRAM) 50 MG tablet Take 1 tablet (50 mg  total) by mouth every 6 (six) hours as needed. 10/21/15   Samantha Tripp Dowless, PA-C    Allergies  Allergen Reactions  . Amoxicillin   . Peanut-Containing Drug Products Hives  . Latex Swelling and Rash    Family History  Problem Relation Age of Onset  . Anesthesia problems Neg Hx   . Hearing loss Neg Hx     Social History Social History  Substance Use Topics  . Smoking status: Current Some Day Smoker    Years: 2.00    Types: Cigarettes  . Smokeless tobacco: Never Used  . Alcohol use No    Review of Systems Constitutional: Negative for fever. Cardiovascular: Negative for chest pain. Respiratory: Negative for shortness of breath. Gastrointestinal: Negative for abdominal pain Genitourinary: Negative for dysuria. Negative for hematuria. Positive for intermittent vaginal bleeding for the past 3 weeks. Musculoskeletal: Negative for back pain. Skin: Negative for rash. Neurological: Negative for headache 10-point ROS otherwise negative.  ____________________________________________   PHYSICAL EXAM:  VITAL SIGNS: ED Triage Vitals [02/05/16 1703]  Enc Vitals Group     BP 104/69     Pulse Rate (!) 107     Resp 18     Temp 98.5 F (36.9 C)     Temp Source Oral     SpO2 100 %     Weight 120 lb (54.4 kg)     Height  (1.651 m)     Head Circumference  Peak Flow      Pain Score      Pain Loc      Pain Edu?      Excl. in GC?     Constitutional: Alert and oriented. Well appearing and in no distress. Eyes: Normal exam ENT   Head: Normocephalic and atraumatic.   Mouth/Throat: Mucous membranes are moist. Cardiovascular: Normal rate, regular rhythm. No murmur Respiratory: Normal respiratory effort without tachypnea nor retractions. Breath sounds are clear  Gastrointestinal: Soft and nontender. No distention.   Musculoskeletal: Nontender with normal range of motion in all extremities.  Neurologic:  Normal speech and language. No gross focal neurologic  deficits are appreciated. Skin:  Skin is warm, dry and intact.  Psychiatric: Mood and affect are normal. Speech and behavior are normal.   ____________________________________________   INITIAL IMPRESSION / ASSESSMENT AND PLAN / ED COURSE  Pertinent labs & imaging results that were available during my care of the patient were reviewed by me and considered in my medical decision making (see chart for details).  The patient presents emergency department with intermittent vaginal bleeding for the past 3 weeks. She states the bleeding mostly occurs after intercourse. She is also worried that she may have contracted an STD and would like to be checked for that. I discussed going ahead and treating for STDs given her concern, the patient would rather wait until her test results are known. We will obtain a wet prep, perform a pelvic examination and close monitoring in the emergency department. The patient's urine test is negative for pregnancy. Blood work is within normal limits.  Wet prep is positive for trichomoniasis. Given the positive wet prep we will treat for STDs. Patient is agreeable to this plan. Patient's pelvic examination shows what appears to be scar tissue on the patient's cervix. Patient states it is been for years and she satted Pap smear, recommended she follow up with her OB/plan as his possible for the past record, the patient is agreeable. Small amount of discharge, mild vaginal bleeding currently.  ____________________________________________   FINAL CLINICAL IMPRESSION(S) / ED DIAGNOSES  Vaginal bleeding Trichomoniasis   Minna AntisKevin Hershy Flenner, MD 02/05/16 1929

## 2016-03-17 ENCOUNTER — Encounter (HOSPITAL_COMMUNITY): Payer: Self-pay | Admitting: *Deleted

## 2016-03-17 ENCOUNTER — Emergency Department (HOSPITAL_COMMUNITY)
Admission: EM | Admit: 2016-03-17 | Discharge: 2016-03-18 | Discharge status: Against Medical Advice | Payer: PRIVATE HEALTH INSURANCE | Attending: Emergency Medicine | Admitting: Emergency Medicine

## 2016-03-17 DIAGNOSIS — N189 Chronic kidney disease, unspecified: Secondary | ICD-10-CM | POA: Insufficient documentation

## 2016-03-17 DIAGNOSIS — R1084 Generalized abdominal pain: Secondary | ICD-10-CM | POA: Insufficient documentation

## 2016-03-17 DIAGNOSIS — J45909 Unspecified asthma, uncomplicated: Secondary | ICD-10-CM | POA: Insufficient documentation

## 2016-03-17 DIAGNOSIS — F17210 Nicotine dependence, cigarettes, uncomplicated: Secondary | ICD-10-CM | POA: Insufficient documentation

## 2016-03-17 DIAGNOSIS — Z91010 Allergy to peanuts: Secondary | ICD-10-CM | POA: Insufficient documentation

## 2016-03-17 DIAGNOSIS — Z91040 Latex allergy status: Secondary | ICD-10-CM | POA: Insufficient documentation

## 2016-03-17 LAB — URINALYSIS, ROUTINE W REFLEX MICROSCOPIC
Bilirubin Urine: NEGATIVE
Glucose, UA: NEGATIVE mg/dL
Hgb urine dipstick: NEGATIVE
KETONES UR: 40 mg/dL — AB
LEUKOCYTES UA: NEGATIVE
NITRITE: NEGATIVE
PROTEIN: NEGATIVE mg/dL
Specific Gravity, Urine: 1.023 (ref 1.005–1.030)
pH: 6 (ref 5.0–8.0)

## 2016-03-17 LAB — CBC
HCT: 39.6 % (ref 36.0–46.0)
Hemoglobin: 12.7 g/dL (ref 12.0–15.0)
MCH: 33.5 pg (ref 26.0–34.0)
MCHC: 32.1 g/dL (ref 30.0–36.0)
MCV: 104.5 fL — AB (ref 78.0–100.0)
PLATELETS: 159 10*3/uL (ref 150–400)
RBC: 3.79 MIL/uL — AB (ref 3.87–5.11)
RDW: 12.4 % (ref 11.5–15.5)
WBC: 3.1 10*3/uL — AB (ref 4.0–10.5)

## 2016-03-17 LAB — COMPREHENSIVE METABOLIC PANEL
ALT: 16 U/L (ref 14–54)
AST: 18 U/L (ref 15–41)
Albumin: 4 g/dL (ref 3.5–5.0)
Alkaline Phosphatase: 53 U/L (ref 38–126)
Anion gap: 8 (ref 5–15)
BILIRUBIN TOTAL: 0.9 mg/dL (ref 0.3–1.2)
BUN: 7 mg/dL (ref 6–20)
CALCIUM: 9.1 mg/dL (ref 8.9–10.3)
CHLORIDE: 106 mmol/L (ref 101–111)
CO2: 21 mmol/L — ABNORMAL LOW (ref 22–32)
CREATININE: 0.52 mg/dL (ref 0.44–1.00)
Glucose, Bld: 84 mg/dL (ref 65–99)
Potassium: 4 mmol/L (ref 3.5–5.1)
Sodium: 135 mmol/L (ref 135–145)
TOTAL PROTEIN: 7.3 g/dL (ref 6.5–8.1)

## 2016-03-17 LAB — LIPASE, BLOOD: LIPASE: 18 U/L (ref 11–51)

## 2016-03-17 MED ORDER — PROCHLORPERAZINE EDISYLATE 5 MG/ML IJ SOLN: 10.0000 mg | Freq: Once | INTRAMUSCULAR | Status: DC

## 2016-03-17 MED ORDER — ONDANSETRON 4 MG PO TBDP
4.0000 mg | ORAL_TABLET | Freq: Once | ORAL | Status: AC | PRN
  Administered 2016-03-17: 4 mg via ORAL

## 2016-03-17 MED ORDER — ONDANSETRON 4 MG PO TBDP
ORAL_TABLET | ORAL | Status: AC
Start: 2016-03-17 — End: 2016-03-18
  Filled 2016-03-17: qty 1

## 2016-03-17 NOTE — ED Notes (Signed)
Pt reports lower abdominal pain rated 6/10 at this time. Pt also reports nausea at this time that has been ongoing for 3 days.   Chief Complaint  Patient presents with  . Abdominal Pain   Past Medical History:  Diagnosis Date  . Anemia   . Asthma    with pregnancy  . Chronic kidney disease    kidney stones  . Depression    doing good  . 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 Urinary tract infection

## 2016-03-17 NOTE — ED Triage Notes (Signed)
Pt here with nausea, weakness, and lower abdominal since 0100.  Pt denies constipation, diarrhea, or vomiting. LMp 02/14/16

## 2016-03-18 ENCOUNTER — Telehealth (HOSPITAL_BASED_OUTPATIENT_CLINIC_OR_DEPARTMENT_OTHER): Payer: Self-pay | Admitting: Emergency Medicine

## 2016-03-18 NOTE — ED Notes (Signed)
Pt noted to have eloped from her room at this time with her significant other.  Pt has left the department at this time.

## 2016-03-18 NOTE — ED Provider Notes (Signed)
[facility]-EMERGENCY DEPT Provider Note   CSN: [id number] Arrival date & time: 03/17/16  1659     History   Chief Complaint Chief Complaint  Patient presents with  . Abdominal Pain    HPI Robyn Williams is a 30 y.o. female.  HPI  Robyn Williams is a 30 year old female presenting to the ED with worsening nausea for two days. Emesis x1 today. Has had difficulty keeping food and water down. Generalized lower abdominal "crampy" pain, worst in the RLQ. Hasn't taken anything for the pain. Complains of her breasts feeling "sore." Reports feeling similar symptoms in her last two pregnancies. Is sexually active with one female partner and not using any contraception. Was given Zofran here with little relief. Complains of a new onset rash on her arm since taking the Zofran.  Denies hematemesis, fever, or diarrhea. No abdominal surgeries.    Past Medical History:  Diagnosis Date  . Anemia   . Asthma    with pregnancy  . Chronic kidney disease    kidney stones  . Depression    doing good  . 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 Urinary tract infection     There are no active problems to display for this patient.   Past Surgical History:  Procedure Laterality Date  . NO PAST SURGERIES      OB History    Gravida Para Term Preterm AB Living   5 4 4  0 1 4   SAB TAB Ectopic Multiple Live Births   1 0 0 0 4       Home Medications    Prior to Admission medications   Not on File    Family History Family History  Problem Relation Age of Onset  . Anesthesia problems Neg Hx   . Hearing loss Neg Hx     Social History Social History  Substance Use Topics  . Smoking status: Current Some Day Smoker    Years: 2.00    Types: Cigarettes  . Smokeless tobacco: Never Used  . Alcohol use No     Allergies   Amoxicillin; Peanut-containing drug products; and Latex   Review of Systems Review of Systems Review of Systems All other systems negative except as documented in the HPI. All  pertinent positives and negatives as reviewed in the HPI.   Physical Exam Updated Vital Signs BP 96/63   Pulse 68   Temp 98.5 F (36.9 C)   Resp 16   LMP 03/16/2016   SpO2 99%   Physical Exam  Constitutional: She appears well-developed and well-nourished.  HENT:  Head: Normocephalic and atraumatic.  Eyes: Conjunctivae are normal. Pupils are equal, round, and reactive to light.  Neck: Trachea normal, normal range of motion and full passive range of motion without pain. Neck supple.  Cardiovascular: Normal rate, regular rhythm and normal pulses.   Pulmonary/Chest: Effort normal and breath sounds normal. Chest wall is not dull to percussion. She exhibits no tenderness, no crepitus, no edema, no deformity and no retraction.  Abdominal: Soft. Normal appearance and bowel sounds are normal. She exhibits no distension. There is no tenderness. There is no rigidity, no rebound, no guarding, no CVA tenderness, no tenderness at McBurney's point and negative Murphy's sign.  No RLQ tenderness on exam  Musculoskeletal: Normal range of motion.  Neurological: She is alert. She has normal strength.  Skin: Skin is warm, dry and intact.  Psychiatric: She has a normal mood and affect. Her speech is normal  and behavior is normal. Judgment and thought content normal. Cognition and memory are normal.     ED Treatments / Results  Labs (all labs ordered are listed, but only abnormal results are displayed) Labs Reviewed  COMPREHENSIVE METABOLIC PANEL - Abnormal; Notable for the following:       Result Value   CO2 21 (*)    All other components within normal limits  CBC - Abnormal; Notable for the following:    WBC 3.1 (*)    RBC 3.79 (*)    MCV 104.5 (*)    All other components within normal limits  URINALYSIS, ROUTINE W REFLEX MICROSCOPIC (NOT AT [facility]) - Abnormal; Notable for the following:    Color, Urine AMBER (*)    Ketones, ur 40 (*)    All other components within normal limits  LIPASE,  BLOOD  PREGNANCY, URINE  I-STAT BETA HCG BLOOD, ED ([facility], [facility], [facility] ONLY)    EKG  EKG Interpretation None       Radiology No results found.  Procedures Procedures (including critical care time)  Medications Ordered in ED Medications  ondansetron (ZOFRAN-ODT) 4 MG disintegrating tablet (not administered)  prochlorperazine (COMPAZINE) injection 10 mg (not administered)  ondansetron (ZOFRAN-ODT) disintegrating tablet 4 mg (4 mg Oral Given 03/17/16 1724)     Initial Impression / Assessment and Plan / ED Course  I have reviewed the triage vital signs and the nursing notes.  Pertinent labs & imaging results that were available during my care of the patient were reviewed by me and considered in my medical decision making (see chart for details).  Clinical Course    Patient became upset due to long wait times and decided to leave AMA. She is aware that we were unable to r/o pregnancy, ectopic, appendicitis or other acute pathology during her visit to the ER today. She says she is going to go home and sleep since that is all she has been here in the ER. Advised to return if symptoms fail to resolve, return or worsen.  No medications or interventions given by IV in the ED.  Final Clinical Impressions(s) / ED Diagnoses   Final diagnoses:  None    New Prescriptions New Prescriptions   No medications on file     Marlon Peliffany Shaune Westfall, PA-C 03/18/16 0009    Geoffery Lyonsouglas Delo, MD 03/18/16 [phone]

## 2016-04-07 ENCOUNTER — Encounter (HOSPITAL_COMMUNITY): Payer: Self-pay

## 2016-04-07 ENCOUNTER — Inpatient Hospital Stay (HOSPITAL_COMMUNITY)
Admission: AD | Admit: 2016-04-07 | Discharge: 2016-04-07 | Disposition: A | Discharge status: Home / Self Care | Payer: PRIVATE HEALTH INSURANCE | Source: Ambulatory Visit | Attending: Obstetrics and Gynecology | Admitting: Obstetrics and Gynecology

## 2016-04-07 ENCOUNTER — Inpatient Hospital Stay (HOSPITAL_COMMUNITY): Payer: Self-pay

## 2016-04-07 DIAGNOSIS — Z3A08 8 weeks gestation of pregnancy: Secondary | ICD-10-CM | POA: Insufficient documentation

## 2016-04-07 DIAGNOSIS — R109 Unspecified abdominal pain: Secondary | ICD-10-CM

## 2016-04-07 DIAGNOSIS — J45909 Unspecified asthma, uncomplicated: Secondary | ICD-10-CM | POA: Insufficient documentation

## 2016-04-07 DIAGNOSIS — O26892 Other specified pregnancy related conditions, second trimester: Secondary | ICD-10-CM | POA: Insufficient documentation

## 2016-04-07 DIAGNOSIS — O208 Other hemorrhage in early pregnancy: Secondary | ICD-10-CM | POA: Insufficient documentation

## 2016-04-07 DIAGNOSIS — Z3491 Encounter for supervision of normal pregnancy, unspecified, first trimester: Secondary | ICD-10-CM

## 2016-04-07 DIAGNOSIS — N189 Chronic kidney disease, unspecified: Secondary | ICD-10-CM | POA: Insufficient documentation

## 2016-04-07 DIAGNOSIS — O99282 Endocrine, nutritional and metabolic diseases complicating pregnancy, second trimester: Secondary | ICD-10-CM | POA: Insufficient documentation

## 2016-04-07 DIAGNOSIS — E860 Dehydration: Secondary | ICD-10-CM | POA: Diagnosis not present

## 2016-04-07 DIAGNOSIS — O99341 Other mental disorders complicating pregnancy, first trimester: Secondary | ICD-10-CM | POA: Insufficient documentation

## 2016-04-07 DIAGNOSIS — O99332 Smoking (tobacco) complicating pregnancy, second trimester: Secondary | ICD-10-CM | POA: Insufficient documentation

## 2016-04-07 DIAGNOSIS — R1084 Generalized abdominal pain: Secondary | ICD-10-CM | POA: Insufficient documentation

## 2016-04-07 DIAGNOSIS — Z3A14 14 weeks gestation of pregnancy: Secondary | ICD-10-CM | POA: Insufficient documentation

## 2016-04-07 DIAGNOSIS — O26891 Other specified pregnancy related conditions, first trimester: Secondary | ICD-10-CM | POA: Insufficient documentation

## 2016-04-07 DIAGNOSIS — F329 Major depressive disorder, single episode, unspecified: Secondary | ICD-10-CM | POA: Insufficient documentation

## 2016-04-07 DIAGNOSIS — K5901 Slow transit constipation: Secondary | ICD-10-CM | POA: Diagnosis not present

## 2016-04-07 DIAGNOSIS — O26899 Other specified pregnancy related conditions, unspecified trimester: Secondary | ICD-10-CM

## 2016-04-07 DIAGNOSIS — O219 Vomiting of pregnancy, unspecified: Secondary | ICD-10-CM | POA: Insufficient documentation

## 2016-04-07 DIAGNOSIS — F17210 Nicotine dependence, cigarettes, uncomplicated: Secondary | ICD-10-CM | POA: Insufficient documentation

## 2016-04-07 DIAGNOSIS — Z880 Allergy status to penicillin: Secondary | ICD-10-CM | POA: Insufficient documentation

## 2016-04-07 DIAGNOSIS — O99511 Diseases of the respiratory system complicating pregnancy, first trimester: Secondary | ICD-10-CM | POA: Insufficient documentation

## 2016-04-07 DIAGNOSIS — O26831 Pregnancy related renal disease, first trimester: Secondary | ICD-10-CM | POA: Insufficient documentation

## 2016-04-07 DIAGNOSIS — K117 Disturbances of salivary secretion: Secondary | ICD-10-CM | POA: Insufficient documentation

## 2016-04-07 LAB — COMPREHENSIVE METABOLIC PANEL
ALBUMIN: 3.8 g/dL (ref 3.5–5.0)
ALT: 12 U/L — ABNORMAL LOW (ref 14–54)
AST: 13 U/L — AB (ref 15–41)
Alkaline Phosphatase: 34 U/L — ABNORMAL LOW (ref 38–126)
Anion gap: 6 (ref 5–15)
BUN: 9 mg/dL (ref 6–20)
CHLORIDE: 103 mmol/L (ref 101–111)
CO2: 25 mmol/L (ref 22–32)
Calcium: 9.2 mg/dL (ref 8.9–10.3)
Creatinine, Ser: 0.43 mg/dL — ABNORMAL LOW (ref 0.44–1.00)
GFR calc Af Amer: >60 mL/min (ref >60)
GFR calc non Af Amer: >60 mL/min (ref >60)
GLUCOSE: 84 mg/dL (ref 65–99)
POTASSIUM: 3.5 mmol/L (ref 3.5–5.1)
Sodium: 134 mmol/L — ABNORMAL LOW (ref 135–145)
Total Bilirubin: 1.3 mg/dL — ABNORMAL HIGH (ref 0.3–1.2)
Total Protein: 7.1 g/dL (ref 6.5–8.1)

## 2016-04-07 LAB — WET PREP, GENITAL
Sperm: NONE SEEN
TRICH WET PREP: NONE SEEN
YEAST WET PREP: NONE SEEN

## 2016-04-07 LAB — URINE MICROSCOPIC-ADD ON
BACTERIA UA: NONE SEEN
RBC / HPF: NONE SEEN RBC/hpf (ref 0–5)

## 2016-04-07 LAB — CBC
HEMATOCRIT: 33.9 % — AB (ref 36.0–46.0)
Hemoglobin: 11.7 g/dL — ABNORMAL LOW (ref 12.0–15.0)
MCH: 33.6 pg (ref 26.0–34.0)
MCHC: 34.5 g/dL (ref 30.0–36.0)
MCV: 97.4 fL (ref 78.0–100.0)
PLATELETS: 198 10*3/uL (ref 150–400)
RBC: 3.48 MIL/uL — ABNORMAL LOW (ref 3.87–5.11)
RDW: 12.7 % (ref 11.5–15.5)
WBC: 4.2 10*3/uL (ref 4.0–10.5)

## 2016-04-07 LAB — URINALYSIS, ROUTINE W REFLEX MICROSCOPIC
Bilirubin Urine: NEGATIVE
GLUCOSE, UA: NEGATIVE mg/dL
Hgb urine dipstick: NEGATIVE
Ketones, ur: 15 mg/dL — AB
Nitrite: NEGATIVE
PROTEIN: NEGATIVE mg/dL
SPECIFIC GRAVITY, URINE: 1.02 (ref 1.005–1.030)
pH: 6.5 (ref 5.0–8.0)

## 2016-04-07 LAB — HCG, QUANTITATIVE, PREGNANCY: hCG, Beta Chain, Quant, S: 163460 m[IU]/mL — ABNORMAL HIGH (ref <5)

## 2016-04-07 LAB — POCT PREGNANCY, URINE: PREG TEST UR: POSITIVE — AB

## 2016-04-07 MED ORDER — PROMETHAZINE HCL 25 MG/ML IJ SOLN
12.5000 mg | Freq: Once | INTRAMUSCULAR | Status: AC
  Administered 2016-04-07: 12.5 mg via INTRAVENOUS
  Filled 2016-04-07: qty 1

## 2016-04-07 MED ORDER — GLYCOPYRROLATE 0.2 MG/ML IJ SOLN
0.1000 mg | Freq: Once | INTRAMUSCULAR | Status: AC
  Administered 2016-04-07: 0.1 mg via INTRAVENOUS
  Filled 2016-04-07: qty 0.5

## 2016-04-07 MED ORDER — METOCLOPRAMIDE HCL 5 MG/ML IJ SOLN
10.0000 mg | Freq: Once | INTRAMUSCULAR | Status: AC
  Administered 2016-04-07: 10 mg via INTRAVENOUS
  Filled 2016-04-07: qty 2

## 2016-04-07 MED ORDER — POLYETHYLENE GLYCOL 3350 17 G PO PACK: 17.0000 g | PACK | Freq: Every day | ORAL | 0 refills | Status: DC

## 2016-04-07 MED ORDER — PROMETHAZINE HCL 25 MG PO TABS: 25.0000 mg | ORAL_TABLET | Freq: Four times a day (QID) | ORAL | 0 refills | Status: DC | PRN

## 2016-04-07 MED ORDER — M.V.I. ADULT IV INJ
Freq: Once | INTRAVENOUS | Status: AC
  Administered 2016-04-07: 20:00:00 via INTRAVENOUS
  Filled 2016-04-07: qty 10

## 2016-04-07 MED ORDER — FAMOTIDINE 20 MG PO TABS
20.0000 mg | ORAL_TABLET | Freq: Two times a day (BID) | ORAL | 1 refills | Status: DC
Start: 2016-04-07 — End: 2016-05-07

## 2016-04-07 MED ORDER — LACTATED RINGERS IV BOLUS (SEPSIS)
1000.0000 mL | Freq: Once | INTRAVENOUS | Status: AC
  Administered 2016-04-07: 1000 mL via INTRAVENOUS

## 2016-04-07 MED ORDER — PROMETHAZINE HCL 25 MG RE SUPP: 25.0000 mg | Freq: Four times a day (QID) | RECTAL | 1 refills | Status: DC | PRN

## 2016-04-07 MED ORDER — FAMOTIDINE IN NACL 20-0.9 MG/50ML-% IV SOLN
20.0000 mg | Freq: Once | INTRAVENOUS | Status: AC
  Administered 2016-04-07: 20 mg via INTRAVENOUS
  Filled 2016-04-07: qty 50

## 2016-04-07 MED ORDER — GLYCOPYRROLATE 2 MG PO TABS: 2.0000 mg | ORAL_TABLET | Freq: Three times a day (TID) | ORAL | 0 refills | Status: DC | PRN

## 2016-04-07 NOTE — MAU Note (Signed)
Pt took a pregnancy test about a month ago and it was positive. Pt states she hasn't been able to keep any food down for the last three weeks. Pt states this always happens to her when she is pregnant. Pt states she is having lower abdominal pain. Pt denies bleeding. Pt states she has been constipated and she hasn't had a bowel movement for the last 5 days.

## 2016-04-07 NOTE — MAU Provider Note (Signed)
History     CSN: [id number]  Arrival date and time: 04/07/16 1754   First Provider Initiated Contact with Patient 04/07/16 1831      Chief Complaint  Patient presents with  . Emesis  . Dizziness  . Constipation   [id number] with early pregnancy here with c/o N/V x3 weeks. She is unable to tolerate food and fluids. She reports excessive salivation and spitting. She also c/o lower abdominal cramping since this am. She denies VB and vaginal discharge. She c/o constipation and no BM in 5-6 days. She tried an enema 2 days ago with little help.    OB History    Gravida Para Term Preterm AB Living   6 4 4  0 1 4   SAB TAB Ectopic Multiple Live Births   1 0 0 0 4      Past Medical History:  Diagnosis Date  . Anemia   . Asthma    with pregnancy  . Chronic kidney disease    kidney stones  . Depression    doing good  . 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 Urinary tract infection     Past Surgical History:  Procedure Laterality Date  . NO PAST SURGERIES      Family History  Problem Relation Age of Onset  . Anesthesia problems Neg Hx   . Hearing loss Neg Hx     Social History  Substance Use Topics  . Smoking status: Current Some Day Smoker    Years: 2.00    Types: Cigarettes  . Smokeless tobacco: Never Used  . Alcohol use No    Allergies:  Allergies  Allergen Reactions  . Amoxicillin Other (See Comments)    "makes me feel weird on the inside"  . Peanut-Containing Drug Products Hives  . Latex Swelling and Rash    No prescriptions prior to admission.    Review of Systems  Constitutional: Negative.   Gastrointestinal: Positive for abdominal pain, constipation, nausea and vomiting.  Neurological: Positive for dizziness.   Physical Exam   Blood pressure 117/77, pulse 97, height 5\' 5"  (1.651 m), weight 56.1 kg (123 lb 12 oz), last menstrual period 03/16/2016, SpO2 100 %.  Physical Exam  Constitutional: She is oriented to person, place, and time. Vital signs  are normal. She appears well-developed and well-nourished. She has a sickly appearance.  HENT:  Head: Normocephalic and atraumatic.  Neck: Normal range of motion. Neck supple.  Cardiovascular: Normal rate.   GI: Soft. She exhibits no distension. There is tenderness (mild, RLQ and suprapubic).  Genitourinary:  Genitourinary Comments: External: no lesions Vagina: rugated, parous, moderate thick white discharge Uterus: non enlarged, anteverted, mildly tender, no CMT Adnexae: no masses, no tenderness left, moderate tenderness right   Musculoskeletal: Normal range of motion.  Neurological: She is alert and oriented to person, place, and time.  Skin: Skin is warm and dry.  Psychiatric: She has a normal mood and affect.   Results for orders placed or performed during the hospital encounter of 04/07/16 (from the past 24 hour(s))  Urinalysis, Routine w reflex microscopic (not at [facility])     Status: Abnormal   Collection Time: 04/07/16  6:22 PM  Result Value Ref Range   Color, Urine YELLOW YELLOW   APPearance CLOUDY (A) CLEAR   Specific Gravity, Urine 1.020 1.005 - 1.030   pH 6.5 5.0 - 8.0   Glucose, UA NEGATIVE NEGATIVE mg/dL   Hgb urine dipstick NEGATIVE NEGATIVE   Bilirubin Urine NEGATIVE  NEGATIVE   Ketones, ur 15 (A) NEGATIVE mg/dL   Protein, ur NEGATIVE NEGATIVE mg/dL   Nitrite NEGATIVE NEGATIVE   Leukocytes, UA TRACE (A) NEGATIVE  Urine microscopic-add on     Status: Abnormal   Collection Time: 04/07/16  6:22 PM  Result Value Ref Range   Squamous Epithelial / LPF 0-5 (A) NONE SEEN   WBC, UA 0-5 0 - 5 WBC/hpf   RBC / HPF NONE SEEN 0 - 5 RBC/hpf   Bacteria, UA NONE SEEN NONE SEEN   Urine-Other MUCOUS PRESENT   Pregnancy, urine POC     Status: Abnormal   Collection Time: 04/07/16  6:24 PM  Result Value Ref Range   Preg Test, Ur POSITIVE (A) NEGATIVE  CBC     Status: Abnormal   Collection Time: 04/07/16  6:48 PM  Result Value Ref Range   WBC 4.2 4.0 - 10.5 K/uL   RBC 3.48 (L)  3.87 - 5.11 MIL/uL   Hemoglobin 11.7 (L) 12.0 - 15.0 g/dL   HCT 16.1 (L) 09.6 - 04.5 %   MCV 97.4 78.0 - 100.0 fL   MCH 33.6 26.0 - 34.0 pg   MCHC 34.5 30.0 - 36.0 g/dL   RDW 40.9 81.1 - 91.4 %   Platelets 198 150 - 400 K/uL  hCG, quantitative, pregnancy     Status: Abnormal   Collection Time: 04/07/16  6:50 PM  Result Value Ref Range   hCG, Beta Chain, Quant, S 163,460 (H) <5 mIU/mL  Wet prep, genital     Status: Abnormal   Collection Time: 04/07/16  6:50 PM  Result Value Ref Range   Yeast Wet Prep HPF POC NONE SEEN NONE SEEN   Trich, Wet Prep NONE SEEN NONE SEEN   Clue Cells Wet Prep HPF POC PRESENT (A) NONE SEEN   WBC, Wet Prep HPF POC FEW (A) NONE SEEN   Sperm NONE SEEN   Comprehensive metabolic panel     Status: Abnormal   Collection Time: 04/07/16  6:50 PM  Result Value Ref Range   Sodium 134 (L) 135 - 145 mmol/L   Potassium 3.5 3.5 - 5.1 mmol/L   Chloride 103 101 - 111 mmol/L   CO2 25 22 - 32 mmol/L   Glucose, Bld 84 65 - 99 mg/dL   BUN 9 6 - 20 mg/dL   Creatinine, Ser 7.82 (L) 0.44 - 1.00 mg/dL   Calcium 9.2 8.9 - 95.6 mg/dL   Total Protein 7.1 6.5 - 8.1 g/dL   Albumin 3.8 3.5 - 5.0 g/dL   AST 13 (L) 15 - 41 U/L   ALT 12 (L) 14 - 54 U/L   Alkaline Phosphatase 34 (L) 38 - 126 U/L   Total Bilirubin 1.3 (H) 0.3 - 1.2 mg/dL   GFR calc non Af Amer >60 >60 mL/min   GFR calc Af Amer >60 >60 mL/min   Anion gap 6 5 - 15   US Ob Comp Less 14 Wks  Result Date: 04/07/2016 CLINICAL DATA:  Acute onset of generalized abdominal pain. Initial encounter. EXAM: OBSTETRIC <14 WK [place] AND TRANSVAGINAL OB US TECHNIQUE: Both transabdominal and transvaginal ultrasound examinations were performed for complete evaluation of the gestation as well as the maternal uterus, adnexal regions, and pelvic cul-de-sac. Transvaginal technique was performed to assess early pregnancy. COMPARISON:  Pelvic ultrasound performed 11/23/2011 FINDINGS: Intrauterine gestational sac: Single; visualized and normal  in shape. Yolk sac:  Yes Embryo:  Yes Cardiac Activity: Yes Heart Rate: 159  bpm  CRL:  1.73 cm   8 w   1 d                  [place] EDC: 10/28/2016 Subchorionic hemorrhage: A small amount of subchorionic hemorrhage is noted. Maternal uterus/adnexae: The uterus is otherwise unremarkable. The ovaries are within normal limits. The right ovary measures 2.4 x 2.3 x 2.2 cm, while the left ovary measures 2.8 x 1.2 x 1.6 cm. No suspicious adnexal masses are seen; there is no evidence for ovarian torsion. No free fluid is seen within the pelvic cul-de-sac. IMPRESSION: 1. Single live intrauterine pregnancy noted, with a crown-rump length of 1.7 cm, corresponding to a gestational age of [redacted] weeks 1 day. This reflects an estimated date of delivery of Oct 28, 2016. 2. Small amount of subchorionic hemorrhage noted. Electronically Signed   By: Roanna Raider M.D.   On: 04/07/2016 20:19   US Ob Transvaginal  Result Date: 04/07/2016 CLINICAL DATA:  Acute onset of generalized abdominal pain. Initial encounter. EXAM: OBSTETRIC <14 WK [place] AND TRANSVAGINAL OB US TECHNIQUE: Both transabdominal and transvaginal ultrasound examinations were performed for complete evaluation of the gestation as well as the maternal uterus, adnexal regions, and pelvic cul-de-sac. Transvaginal technique was performed to assess early pregnancy. COMPARISON:  Pelvic ultrasound performed 11/23/2011 FINDINGS: Intrauterine gestational sac: Single; visualized and normal in shape. Yolk sac:  Yes Embryo:  Yes Cardiac Activity: Yes Heart Rate: 159  bpm CRL:  1.73 cm   8 w   1 d                  [place] EDC: 10/28/2016 Subchorionic hemorrhage: A small amount of subchorionic hemorrhage is noted. Maternal uterus/adnexae: The uterus is otherwise unremarkable. The ovaries are within normal limits. The right ovary measures 2.4 x 2.3 x 2.2 cm, while the left ovary measures 2.8 x 1.2 x 1.6 cm. No suspicious adnexal masses are seen; there is no evidence for ovarian torsion. No free  fluid is seen within the pelvic cul-de-sac. IMPRESSION: 1. Single live intrauterine pregnancy noted, with a crown-rump length of 1.7 cm, corresponding to a gestational age of [redacted] weeks 1 day. This reflects an estimated date of delivery of Oct 28, 2016. 2. Small amount of subchorionic hemorrhage noted. Electronically Signed   By: Roanna Raider M.D.   On: 04/07/2016 20:19    MAU Course  Procedures LR 1 L bolus Phenergan 12.5 mg IV Pepcid 20 mg IV MTV in LR1 L Robinul 0.1 mg IV Reglan 10 mg IV x1  MDM Labs and [place] ordered and reviewed. No relief of nausea after Phenergan. Reglan ordered. Did not like the way Reglan made her feel, couldn't describe. Zofran not good option d/t constipation. Nausea improved after all meds. No episodes of emesis. Normal IUP, cramping likely physiologic to pregnancy. Stable for discharge home.   Assessment and Plan  [redacted]w[redacted]d live IUP 1. Normal intrauterine pregnancy on prenatal ultrasound in first trimester   2. Abdominal pain in pregnancy   3. Dehydration   4. Nausea and vomiting during pregnancy   5. Slow transit constipation   6. Ptyalism    Discharge home Follow up with OB provider of choice Return for worsening sx    Medication List    TAKE these medications   bisacodyl 10 MG/30ML Enem Commonly known as:  FLEET Place 10 mg rectally once.   famotidine 20 MG tablet Commonly known as:  PEPCID Take 1 tablet (20 mg total) by mouth 2 (two)  times daily.   glycopyrrolate 2 MG tablet Commonly known as:  ROBINUL-FORTE Take 1 tablet (2 mg total) by mouth 3 (three) times daily as needed.   polyethylene glycol packet Commonly known as:  MIRALAX Take 17 g by mouth daily.   promethazine 25 MG tablet Commonly known as:  PHENERGAN Take 1 tablet (25 mg total) by mouth every 6 (six) hours as needed for nausea or vomiting.   promethazine 25 MG suppository Commonly known as:  PHENERGAN Place 1 suppository (25 mg total) rectally every 6 (six) hours as needed  for nausea.       Donette LarryMelanie Lindyn Vossler, CNM 04/07/2016, 6:32 PM

## 2016-04-07 NOTE — Discharge Instructions (Signed)
Eating Plan for Hyperemesis Gravidarum Severe cases of hyperemesis gravidarum can lead to dehydration and malnutrition. The hyperemesis eating plan is one way to lessen the symptoms of nausea and vomiting. It is often used with prescribed medicines to control your symptoms.  WHAT CAN I DO TO RELIEVE MY SYMPTOMS? Listen to your body. Everyone is different and has different preferences. Find what works best for you. Some of the following things may help:  Eat and drink slowly.  Eat 5-6 small meals daily instead of 3 large meals.   Eat crackers before you get out of bed in the morning.   Starchy foods are usually well tolerated (such as cereal, toast, bread, potatoes, pasta, rice, and pretzels).   Ginger may help with nausea. Add  tsp ground ginger to hot tea or choose ginger tea.   Try drinking 100% fruit juice or an electrolyte drink.  Continue to take your prenatal vitamins as directed by your health care provider. If you are having trouble taking your prenatal vitamins, talk with your health care provider about different options.  Include at least 1 serving of protein with your meals and snacks (such as meats or poultry, beans, nuts, eggs, or yogurt). Try eating a protein-rich snack before bed (such as cheese and crackers or a half [place] or peanut butter sandwich). WHAT THINGS SHOULD I AVOID TO REDUCE MY SYMPTOMS? The following things may help reduce your symptoms:  Avoid foods with strong smells. Try eating meals in well-ventilated areas that are free of odors.  Avoid drinking water or other beverages with meals. Try not to drink anything less than 30 minutes before and after meals.  Avoid drinking more than 1 cup of fluid at a time.  Avoid fried or high-fat foods, such as butter and cream sauces.  Avoid spicy foods.  Avoid skipping meals the best you can. Nausea can be more intense on an empty stomach. If you cannot tolerate food at that time, do not force it. Try sucking on  ice chips or other frozen items and make up the calories later.  Avoid lying down within 2 hours after eating.   This information is not intended to replace advice given to you by your health care provider. Make sure you discuss any questions you have with your health care provider.   Document Released: 04/12/2007 Document Revised: 06/20/2013 Document Reviewed: 04/19/2013 Elsevier Interactive Patient Education 2016 [employer] Hyperemesis Gravidarum Hyperemesis gravidarum is a severe form of nausea and vomiting that happens during pregnancy. Hyperemesis is worse than morning sickness. It may cause you to have nausea or vomiting all day for many days. It may keep you from eating and drinking enough food and liquids. Hyperemesis usually occurs during the first half (the first 20 weeks) of pregnancy. It often goes away once a woman is in her second half of pregnancy. However, sometimes hyperemesis continues through an entire pregnancy.  CAUSES  The cause of this condition is not completely known but is thought to be related to changes in the body's hormones when pregnant. It could be from the high level of the pregnancy hormone or an increase in estrogen in the body.  SIGNS AND SYMPTOMS   Severe nausea and vomiting.  Nausea that does not go away.  Vomiting that does not allow you to keep any food down.  Weight loss and body fluid loss (dehydration).  Having no desire to eat or not liking food you have previously enjoyed. DIAGNOSIS  Your health care provider will  do a physical exam and ask you about your symptoms. He or she may also order blood tests and urine tests to make sure something else is not causing the problem.  TREATMENT  You may only need medicine to control the problem. If medicines do not control the nausea and vomiting, you will be treated in the hospital to prevent dehydration, increased acid in the blood (acidosis), weight loss, and changes in the electrolytes in your body  that may harm the unborn baby (fetus). You may need IV fluids.  HOME CARE INSTRUCTIONS   Only take over-the-counter or prescription medicines as directed by your health care provider.  Try eating a couple of dry crackers or toast in the morning before getting out of bed.  Avoid foods and smells that upset your stomach.  Avoid fatty and spicy foods.  Eat 5-6 small meals a day.  Do not drink when eating meals. Drink between meals.  For snacks, eat high-protein foods, such as cheese.  Eat or suck on things that have ginger in them. Ginger helps nausea.  Avoid food preparation. The smell of food can spoil your appetite.  Avoid iron pills and iron in your multivitamins until after 3-4 months of being pregnant. However, consult with your health care provider before stopping any prescribed iron pills. SEEK MEDICAL CARE IF:   Your abdominal pain increases.  You have a severe headache.  You have vision problems.  You are losing weight. SEEK IMMEDIATE MEDICAL CARE IF:   You are unable to keep fluids down.  You vomit blood.  You have constant nausea and vomiting.  You have excessive weakness.  You have extreme thirst.  You have dizziness or fainting.  You have a fever or persistent symptoms for more than 2-3 days.  You have a fever and your symptoms suddenly get worse. MAKE SURE YOU:   Understand these instructions.  Will watch your condition.  Will get help right away if you are not doing well or get worse.   This information is not intended to replace advice given to you by your health care provider. Make sure you discuss any questions you have with your health care provider.   Document Released: 06/15/2005 Document Revised: 04/05/2013 Document Reviewed: 01/25/2013 Elsevier Interactive Patient Education 2016 [employer] Constipation, Adult Constipation is when a person has fewer than three bowel movements a week, has difficulty having a bowel movement, or has  stools that are dry, hard, or larger than normal. As people grow older, constipation is more common. A low-fiber diet, not taking in enough fluids, and taking certain medicines may make constipation worse.  CAUSES   Certain medicines, such as antidepressants, pain medicine, iron supplements, antacids, and water pills.   Certain diseases, such as diabetes, irritable bowel syndrome (IBS), thyroid disease, or depression.   Not drinking enough water.   Not eating enough fiber-rich foods.   Stress or travel.   Lack of physical activity or exercise.   Ignoring the urge to have a bowel movement.   Using laxatives too much.  SIGNS AND SYMPTOMS   Having fewer than three bowel movements a week.   Straining to have a bowel movement.   Having stools that are hard, dry, or larger than normal.   Feeling full or bloated.   Pain in the lower abdomen.   Not feeling relief after having a bowel movement.  DIAGNOSIS  Your health care provider will take a medical history and perform a physical exam. Further testing may  be done for severe constipation. Some tests may include:  A barium enema X-ray to examine your rectum, colon, and, sometimes, your small intestine.   A sigmoidoscopy to examine your lower colon.   A colonoscopy to examine your entire colon. TREATMENT  Treatment will depend on the severity of your constipation and what is causing it. Some dietary treatments include drinking more fluids and eating more fiber-rich foods. Lifestyle treatments may include regular exercise. If these diet and lifestyle recommendations do not help, your health care provider may recommend taking over-the-counter laxative medicines to help you have bowel movements. Prescription medicines may be prescribed if over-the-counter medicines do not work.  HOME CARE INSTRUCTIONS   Eat foods that have a lot of fiber, such as fruits, vegetables, whole grains, and beans.  Limit foods high in fat and  processed sugars, such as french fries, hamburgers, cookies, candies, and soda.   A fiber supplement may be added to your diet if you cannot get enough fiber from foods.   Drink enough fluids to keep your urine clear or pale yellow.   Exercise regularly or as directed by your health care provider.   Go to the restroom when you have the urge to go. Do not hold it.   Only take over-the-counter or prescription medicines as directed by your health care provider. Do not take other medicines for constipation without talking to your health care provider first.  SEEK IMMEDIATE MEDICAL CARE IF:   You have bright red blood in your stool.   Your constipation lasts for more than 4 days or gets worse.   You have abdominal or rectal pain.   You have thin, pencil-like stools.   You have unexplained weight loss. MAKE SURE YOU:   Understand these instructions.  Will watch your condition.  Will get help right away if you are not doing well or get worse.   This information is not intended to replace advice given to you by your health care provider. Make sure you discuss any questions you have with your health care provider.   Document Released: 03/13/2004 Document Revised: 07/06/2014 Document Reviewed: 03/27/2013 Elsevier Interactive Patient Education [employer]

## 2016-04-08 LAB — GC/CHLAMYDIA PROBE AMP (~~LOC~~) NOT AT ARMC
CHLAMYDIA, DNA PROBE: NEGATIVE
NEISSERIA GONORRHEA: NEGATIVE

## 2016-05-07 ENCOUNTER — Emergency Department (HOSPITAL_COMMUNITY)
Admission: EM | Admit: 2016-05-07 | Discharge: 2016-05-08 | Disposition: A | Discharge status: Home / Self Care | Payer: PRIVATE HEALTH INSURANCE | Attending: Emergency Medicine | Admitting: Emergency Medicine

## 2016-05-07 ENCOUNTER — Encounter (HOSPITAL_COMMUNITY): Payer: Self-pay | Admitting: Emergency Medicine

## 2016-05-07 DIAGNOSIS — O211 Hyperemesis gravidarum with metabolic disturbance: Secondary | ICD-10-CM | POA: Insufficient documentation

## 2016-05-07 DIAGNOSIS — Z91010 Allergy to peanuts: Secondary | ICD-10-CM | POA: Insufficient documentation

## 2016-05-07 DIAGNOSIS — O99332 Smoking (tobacco) complicating pregnancy, second trimester: Secondary | ICD-10-CM | POA: Insufficient documentation

## 2016-05-07 DIAGNOSIS — F17210 Nicotine dependence, cigarettes, uncomplicated: Secondary | ICD-10-CM | POA: Insufficient documentation

## 2016-05-07 DIAGNOSIS — Z3A15 15 weeks gestation of pregnancy: Secondary | ICD-10-CM | POA: Insufficient documentation

## 2016-05-07 DIAGNOSIS — O210 Mild hyperemesis gravidarum: Secondary | ICD-10-CM

## 2016-05-07 DIAGNOSIS — E860 Dehydration: Secondary | ICD-10-CM

## 2016-05-07 DIAGNOSIS — J45909 Unspecified asthma, uncomplicated: Secondary | ICD-10-CM | POA: Insufficient documentation

## 2016-05-07 DIAGNOSIS — Z3490 Encounter for supervision of normal pregnancy, unspecified, unspecified trimester: Secondary | ICD-10-CM

## 2016-05-07 DIAGNOSIS — N189 Chronic kidney disease, unspecified: Secondary | ICD-10-CM | POA: Insufficient documentation

## 2016-05-07 DIAGNOSIS — Z91040 Latex allergy status: Secondary | ICD-10-CM | POA: Insufficient documentation

## 2016-05-07 LAB — I-STAT VENOUS BLOOD GAS, ED
ACID-BASE DEFICIT: 2 mmol/L (ref 0.0–2.0)
BICARBONATE: 22.1 mmol/L (ref 20.0–28.0)
O2 SAT: 33 %
PH VEN: 7.395 (ref 7.250–7.430)
TCO2: 23 mmol/L (ref 0–100)
pCO2, Ven: 36.1 mmHg — ABNORMAL LOW (ref 44.0–60.0)
pO2, Ven: 20 mmHg — CL (ref 32.0–45.0)

## 2016-05-07 LAB — URINALYSIS, ROUTINE W REFLEX MICROSCOPIC
Bilirubin Urine: NEGATIVE
Glucose, UA: 100 mg/dL — AB
HGB URINE DIPSTICK: NEGATIVE
Ketones, ur: 40 mg/dL — AB
NITRITE: NEGATIVE
PROTEIN: NEGATIVE mg/dL
SPECIFIC GRAVITY, URINE: 1.01 (ref 1.005–1.030)
pH: 7 (ref 5.0–8.0)

## 2016-05-07 LAB — CBC WITH DIFFERENTIAL/PLATELET
BASOS ABS: 0 10*3/uL (ref 0.0–0.1)
Basophils Relative: 1 %
EOS PCT: 2 %
Eosinophils Absolute: 0.1 10*3/uL (ref 0.0–0.7)
HEMATOCRIT: 30.3 % — AB (ref 36.0–46.0)
Hemoglobin: 10.3 g/dL — ABNORMAL LOW (ref 12.0–15.0)
LYMPHS PCT: 56 %
Lymphs Abs: 3.1 10*3/uL (ref 0.7–4.0)
MCH: 33.7 pg (ref 26.0–34.0)
MCHC: 34 g/dL (ref 30.0–36.0)
MCV: 99 fL (ref 78.0–100.0)
MONO ABS: 0.3 10*3/uL (ref 0.1–1.0)
MONOS PCT: 6 %
Neutro Abs: 1.9 10*3/uL (ref 1.7–7.7)
Neutrophils Relative %: 35 %
PLATELETS: 240 10*3/uL (ref 150–400)
RBC: 3.06 MIL/uL — ABNORMAL LOW (ref 3.87–5.11)
RDW: 12.6 % (ref 11.5–15.5)
WBC: 5.4 10*3/uL (ref 4.0–10.5)

## 2016-05-07 LAB — COMPREHENSIVE METABOLIC PANEL
ALT: 16 U/L (ref 14–54)
ANION GAP: 8 (ref 5–15)
AST: 14 U/L — AB (ref 15–41)
Albumin: 3.2 g/dL — ABNORMAL LOW (ref 3.5–5.0)
Alkaline Phosphatase: 68 U/L (ref 38–126)
BILIRUBIN TOTAL: 0.9 mg/dL (ref 0.3–1.2)
BUN: 7 mg/dL (ref 6–20)
CHLORIDE: 101 mmol/L (ref 101–111)
CO2: 21 mmol/L — ABNORMAL LOW (ref 22–32)
Calcium: 8.9 mg/dL (ref 8.9–10.3)
Creatinine, Ser: 0.53 mg/dL (ref 0.44–1.00)
Glucose, Bld: 252 mg/dL — ABNORMAL HIGH (ref 65–99)
POTASSIUM: 3.3 mmol/L — AB (ref 3.5–5.1)
Sodium: 130 mmol/L — ABNORMAL LOW (ref 135–145)
TOTAL PROTEIN: 6.4 g/dL — AB (ref 6.5–8.1)

## 2016-05-07 LAB — URINE MICROSCOPIC-ADD ON: RBC / HPF: NONE SEEN RBC/hpf (ref 0–5)

## 2016-05-07 LAB — CBG MONITORING, ED: GLUCOSE-CAPILLARY: 64 mg/dL — AB (ref 65–99)

## 2016-05-07 LAB — I-STAT BETA HCG BLOOD, ED (MC, WL, AP ONLY)

## 2016-05-07 MED ORDER — METOCLOPRAMIDE HCL 5 MG/ML IJ SOLN
5.0000 mg | Freq: Once | INTRAMUSCULAR | Status: AC
  Administered 2016-05-07: 5 mg via INTRAVENOUS
  Filled 2016-05-07: qty 2

## 2016-05-07 MED ORDER — LACTATED RINGERS IV BOLUS (SEPSIS)
1000.0000 mL | Freq: Once | INTRAVENOUS | Status: AC
  Administered 2016-05-07: 1000 mL via INTRAVENOUS

## 2016-05-07 MED ORDER — DEXTROSE 5 % IV BOLUS
1000.0000 mL | Freq: Once | INTRAVENOUS | Status: AC
  Administered 2016-05-07: 1000 mL via INTRAVENOUS

## 2016-05-07 NOTE — ED Provider Notes (Signed)
[facility]-EMERGENCY DEPT Provider Note   CSN: [id number] Arrival date & time: 05/07/16  2139     History   Chief Complaint Chief Complaint  Patient presents with  . Altered Mental Status    3 months pregnant unresponsive    HPI Robyn Williams is a 30 y.o. female.  Patient presents from home by EMS for decreased responsiveness. She is a G5P4 and is around three months pregnant. History of hyperemesis gravidarum and she has recently developed hives with zofran and phenergan so has been unable to take both of these. She has been eating poorly today and vomiting. Was laying in bed all day and when her husband went to wake her up she was not responding to him so she called EMS. No reported seizure activity. Blood sugar in the low 60s so she was given D10 en route to the ED. Vital signs stable en route.   The history is provided by the patient, the EMS personnel and the spouse. No language interpreter was used.  Altered Mental Status   This is a new problem. The current episode started 1 to 2 hours ago. The problem has not changed since onset.Associated symptoms include somnolence and weakness. Risk factors: Pregnancy. Her past medical history does not include diabetes, CVA or heart disease.    Past Medical History:  Diagnosis Date  . Anemia   . Asthma    with pregnancy  . Chronic kidney disease    kidney stones  . Depression    doing good  . 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 Urinary tract infection     There are no active problems to display for this patient.   Past Surgical History:  Procedure Laterality Date  . NO PAST SURGERIES      OB History    Gravida Para Term Preterm AB Living   6 4 4  0 1 4   SAB TAB Ectopic Multiple Live Births   1 0 0 0 4       Home Medications    Prior to Admission medications   Medication Sig Start Date End Date Taking? Authorizing Provider  promethazine (PHENERGAN) 25 MG suppository Place 1 suppository (25 mg total) rectally every 6  (six) hours as needed for nausea. 04/07/16 04/07/17 Yes Donette LarryMelanie Bhambri, CNM  promethazine (PHENERGAN) 25 MG tablet Take 1 tablet (25 mg total) by mouth every 6 (six) hours as needed for nausea or vomiting. 04/07/16  Yes Donette LarryMelanie Bhambri, CNM  doxylamine, Sleep, (UNISOM) 25 MG tablet Take 1 tablet (25 mg total) by mouth at bedtime. Take with prescribed Vitamin B6 for nausea. 05/08/16   Preston FleetingAnna Daliya Parchment, MD  metoCLOPramide (REGLAN) 10 MG tablet Take 1 tablet (10 mg total) by mouth every 8 (eight) hours as needed for nausea. 05/08/16   Preston FleetingAnna Grover Robinson, MD  pyridOXINE (VITAMIN B-6) 50 MG tablet Take 1 tablet (50 mg total) by mouth at bedtime. Take with prescribed doxylamine for nausea 05/08/16   Preston FleetingAnna Kleigh Hoelzer, MD    Family History Family History  Problem Relation Age of Onset  . Anesthesia problems Neg Hx   . Hearing loss Neg Hx     Social History Social History  Substance Use Topics  . Smoking status: Current Some Day Smoker    Years: 2.00    Types: Cigarettes  . Smokeless tobacco: Never Used  . Alcohol use No     Allergies   Amoxicillin; Peanut-containing drug products; Phenergan [promethazine hcl]; Zofran [ondansetron hcl]; and Latex  Review of Systems Review of Systems  Constitutional: Negative for fever.  HENT: Negative.   Respiratory: Negative.   Cardiovascular: Negative.   Gastrointestinal: Positive for vomiting. Negative for abdominal pain.  Genitourinary: Negative for vaginal bleeding and vaginal discharge.  Musculoskeletal: Negative.   Skin: Negative.   Allergic/Immunologic: Negative for immunocompromised state.  Neurological: Positive for weakness and light-headedness.  Hematological: Negative.      Physical Exam Updated Vital Signs BP 113/72   Pulse 97   Temp 98.3 F (36.8 C) (Oral)   Resp 19   Ht 5' (1.524 m)   Wt 55.8 kg   LMP 03/16/2016 (Within Months)   SpO2 100%   BMI 24.02 kg/m   Physical Exam  Constitutional: She appears well-developed and  well-nourished. No distress.  HENT:  Head: Normocephalic and atraumatic.  Eyes: Conjunctivae and EOM are normal. Pupils are equal, round, and reactive to light.  Pupils 2 mm and reactive bilaterally  Neck: Normal range of motion. Neck supple.  Cardiovascular: Normal rate, regular rhythm, normal heart sounds and intact distal pulses.  Exam reveals no gallop and no friction rub.   No murmur heard. Pulmonary/Chest: Effort normal and breath sounds normal. No respiratory distress. She has no wheezes. She has no rales.  Abdominal: Soft. She exhibits no distension. There is no tenderness. There is no rebound and no guarding.  Musculoskeletal: She exhibits no edema.  Neurological: GCS eye subscore is 3. GCS verbal subscore is 1. GCS motor subscore is 6.  Skin: Skin is warm and dry. She is not diaphoretic.     ED Treatments / Results  Labs (all labs ordered are listed, but only abnormal results are displayed) Labs Reviewed  CBC WITH DIFFERENTIAL/PLATELET - Abnormal; Notable for the following:       Result Value   RBC 3.06 (*)    Hemoglobin 10.3 (*)    HCT 30.3 (*)    All other components within normal limits  COMPREHENSIVE METABOLIC PANEL - Abnormal; Notable for the following:    Sodium 130 (*)    Potassium 3.3 (*)    CO2 21 (*)    Glucose, Bld 252 (*)    Total Protein 6.4 (*)    Albumin 3.2 (*)    AST 14 (*)    All other components within normal limits  URINALYSIS, ROUTINE W REFLEX MICROSCOPIC (NOT AT [facility]) - Abnormal; Notable for the following:    APPearance CLOUDY (*)    Glucose, UA 100 (*)    Ketones, ur 40 (*)    Leukocytes, UA TRACE (*)    All other components within normal limits  URINE MICROSCOPIC-ADD ON - Abnormal; Notable for the following:    Squamous Epithelial / LPF 0-5 (*)    Bacteria, UA RARE (*)    All other components within normal limits  I-STAT VENOUS BLOOD GAS, ED - Abnormal; Notable for the following:    pCO2, Ven 36.1 (*)    pO2, Ven 20.0 (*)    All other  components within normal limits  I-STAT BETA HCG BLOOD, ED ([facility], [facility], [facility] ONLY) - Abnormal; Notable for the following:    I-stat hCG, quantitative >2,000.0 (*)    All other components within normal limits  CBG MONITORING, ED - Abnormal; Notable for the following:    Glucose-Capillary 64 (*)    All other components within normal limits  CBG MONITORING, ED - Abnormal; Notable for the following:    Glucose-Capillary 272 (*)    All other components within  normal limits  I-STAT BETA HCG BLOOD, ED ([facility], [facility], [facility] ONLY)  CBG MONITORING, ED    EKG  EKG Interpretation  Date/Time:  Thursday May 07 2016 21:43:20 EST Ventricular Rate:  83 PR Interval:    QRS Duration: 77 QT Interval:  346 QTC Calculation: 407 R Axis:   70 Text Interpretation:  Sinus rhythm Probable left atrial enlargement RSR' in V1 or V2, probably normal variant No significant change since last tracing Confirmed by [facility]  MD, Alphonzo Lemmings ([id number]) on 05/07/2016 10:03:13 PM       Radiology No results found.  Procedures Procedures (including critical care time)  Medications Ordered in ED Medications  lactated ringers bolus 1,000 mL (0 mLs Intravenous Stopped 05/08/16 0017)  metoCLOPramide (REGLAN) injection 5 mg (5 mg Intravenous Given 05/07/16 2254)  dextrose 5 % bolus 1,000 mL (0 mLs Intravenous Stopped 05/08/16 0044)     Initial Impression / Assessment and Plan / ED Course  I have reviewed the triage vital signs and the nursing notes.  Pertinent labs & imaging results that were available during my care of the patient were reviewed by me and considered in my medical decision making (see chart for details).  Clinical Course     Patient presents with somnolence and decreased responsiveness with history of hyperemesis in pregnancy, with multiple episodes of vomiting today. Was found to be mildly hypoglycemic by EMS so was given a bolus of D10. On arrival she is able to open her eyes and squeeze hands on command, though  still appearing fatigued. Symptoms improved after fluid resuscitation and reglan. Labs unremarkable and she has no signs of UTI or pyelonephritis. She was able to tolerate PO after getting reglan. Repeat blood glucose was 62 so she was given 1L of D5W and was able to take PO. Had improved blood glucose following this with normal mental status. Suspect symptoms likely due to dehydration and vomiting. She was given a prescription for doxylamine/B6 for nausea relief as well as reglan as necessary. Feel she is appropriate for discharge home as she is back to baseline and is tolerating Po. She was given return precautions for worsening symptoms and expressed understanding. Was instructed to follow up with gynecology and referred to the [facility]. She is in good condition for discharge home.  Final Clinical Impressions(s) / ED Diagnoses   Final diagnoses:  Dehydration  Hyperemesis gravidarum  Pregnancy, unspecified gestational age    New Prescriptions New Prescriptions   DOXYLAMINE, SLEEP, (UNISOM) 25 MG TABLET    Take 1 tablet (25 mg total) by mouth at bedtime. Take with prescribed Vitamin B6 for nausea.   METOCLOPRAMIDE (REGLAN) 10 MG TABLET    Take 1 tablet (10 mg total) by mouth every 8 (eight) hours as needed for nausea.   PYRIDOXINE (VITAMIN B-6) 50 MG TABLET    Take 1 tablet (50 mg total) by mouth at bedtime. Take with prescribed doxylamine for nausea     Preston Fleeting, MD 05/08/16 6045    Gwyneth Sprout, MD 05/08/16 1734

## 2016-05-07 NOTE — ED Triage Notes (Signed)
3 months pregnant pt coming by EMS for been unresponsive with a CBG 60 on EMS arrival to her house, pt mostly responsive to pain only, D10% given by EMS PTA CBG up to 67, pt having hyperemesis during the past 2 weeks no eating or drinking well some allergic reaction with hive to Phenergan and Zofran, unknown last seen well. BP 116/50, HR 70, R14, SPO2 100 on RA, no prenatal care right now.

## 2016-05-08 LAB — CBG MONITORING, ED: GLUCOSE-CAPILLARY: 272 mg/dL — AB (ref 65–99)

## 2016-05-08 MED ORDER — DOXYLAMINE SUCCINATE (SLEEP) 25 MG PO TABS: 25.0000 mg | ORAL_TABLET | Freq: Every day | ORAL | 0 refills | Status: DC

## 2016-05-08 MED ORDER — VITAMIN B-6 50 MG PO TABS: 50.0000 mg | ORAL_TABLET | Freq: Every day | ORAL | 0 refills | Status: DC

## 2016-05-08 MED ORDER — METOCLOPRAMIDE HCL 10 MG PO TABS: 10.0000 mg | ORAL_TABLET | Freq: Three times a day (TID) | ORAL | 0 refills | Status: DC | PRN

## 2016-06-29 NOTE — L&D Delivery Note (Signed)
Delivery Note  1930 In room to see pt, reporting pressure with the urge to push. SVE: 10/10/+2, vertex. Progress made with maternal pushing efforts. A viable female infant was delivered at 1946, receiving nurse at bedside for birth . Infant immediately to maternal abdomen. Delayed cord clamping. Three vessel cord. Cord blood obtained. APGAR: 7 ,9 ; weight pending.    Pitocin infusing. Spontaneous delivery of placenta at 1950. Laceration: Hemostatic skid make to left labia unrepaired. Anesthesia: epidural. EBL: 300 mL.   Vault check completed. Count correct x 2. 800 mcg cytotec placed rectal due to multiparity and history of blood transfusion after birth.   Mom to postpartum.  Baby to Couplet care / Skin to Skin. Family members at bedside for birth.   Begin routine postpartum care and orders. CT stone protocol, see orders. NPO after midnight. Urology to round in AM. Tubal tomorrow as well.   Gunnar BullaJenkins Michelle Byron Peacock, CNM 10/31/2016, 8:13 PM

## 2016-07-18 ENCOUNTER — Inpatient Hospital Stay (HOSPITAL_COMMUNITY)
Admission: AD | Admit: 2016-07-18 | Discharge: 2016-07-18 | Disposition: A | Discharge status: Home / Self Care | Payer: PRIVATE HEALTH INSURANCE | Source: Ambulatory Visit | Attending: Obstetrics and Gynecology | Admitting: Obstetrics and Gynecology

## 2016-07-18 ENCOUNTER — Encounter (HOSPITAL_COMMUNITY): Payer: Self-pay | Admitting: *Deleted

## 2016-07-18 DIAGNOSIS — Z3A22 22 weeks gestation of pregnancy: Secondary | ICD-10-CM | POA: Diagnosis not present

## 2016-07-18 DIAGNOSIS — O99332 Smoking (tobacco) complicating pregnancy, second trimester: Secondary | ICD-10-CM | POA: Insufficient documentation

## 2016-07-18 DIAGNOSIS — J45909 Unspecified asthma, uncomplicated: Secondary | ICD-10-CM | POA: Insufficient documentation

## 2016-07-18 DIAGNOSIS — O99342 Other mental disorders complicating pregnancy, second trimester: Secondary | ICD-10-CM | POA: Insufficient documentation

## 2016-07-18 DIAGNOSIS — N189 Chronic kidney disease, unspecified: Secondary | ICD-10-CM | POA: Diagnosis not present

## 2016-07-18 DIAGNOSIS — O99891 Other specified diseases and conditions complicating pregnancy: Secondary | ICD-10-CM

## 2016-07-18 DIAGNOSIS — O26892 Other specified pregnancy related conditions, second trimester: Secondary | ICD-10-CM | POA: Insufficient documentation

## 2016-07-18 DIAGNOSIS — F329 Major depressive disorder, single episode, unspecified: Secondary | ICD-10-CM | POA: Diagnosis not present

## 2016-07-18 DIAGNOSIS — O99512 Diseases of the respiratory system complicating pregnancy, second trimester: Secondary | ICD-10-CM | POA: Insufficient documentation

## 2016-07-18 DIAGNOSIS — M545 Low back pain: Secondary | ICD-10-CM | POA: Diagnosis present

## 2016-07-18 DIAGNOSIS — O26832 Pregnancy related renal disease, second trimester: Secondary | ICD-10-CM | POA: Insufficient documentation

## 2016-07-18 DIAGNOSIS — O26891 Other specified pregnancy related conditions, first trimester: Secondary | ICD-10-CM | POA: Diagnosis not present

## 2016-07-18 DIAGNOSIS — Z880 Allergy status to penicillin: Secondary | ICD-10-CM | POA: Diagnosis not present

## 2016-07-18 DIAGNOSIS — Z79899 Other long term (current) drug therapy: Secondary | ICD-10-CM | POA: Diagnosis not present

## 2016-07-18 DIAGNOSIS — R109 Unspecified abdominal pain: Secondary | ICD-10-CM | POA: Insufficient documentation

## 2016-07-18 DIAGNOSIS — M549 Dorsalgia, unspecified: Secondary | ICD-10-CM

## 2016-07-18 DIAGNOSIS — R51 Headache: Secondary | ICD-10-CM | POA: Insufficient documentation

## 2016-07-18 DIAGNOSIS — O9989 Other specified diseases and conditions complicating pregnancy, childbirth and the puerperium: Secondary | ICD-10-CM

## 2016-07-18 LAB — URINALYSIS, ROUTINE W REFLEX MICROSCOPIC
BILIRUBIN URINE: NEGATIVE
Glucose, UA: NEGATIVE mg/dL
HGB URINE DIPSTICK: NEGATIVE
Ketones, ur: NEGATIVE mg/dL
Leukocytes, UA: NEGATIVE
NITRITE: NEGATIVE
PH: 6 (ref 5.0–8.0)
Protein, ur: NEGATIVE mg/dL
Specific Gravity, Urine: 1.024 (ref 1.005–1.030)

## 2016-07-18 MED ORDER — IBUPROFEN 600 MG PO TABS
600.0000 mg | ORAL_TABLET | Freq: Once | ORAL | Status: AC
  Administered 2016-07-18: 600 mg via ORAL
  Filled 2016-07-18: qty 1

## 2016-07-18 MED ORDER — CYCLOBENZAPRINE HCL 10 MG PO TABS
10.0000 mg | ORAL_TABLET | Freq: Once | ORAL | Status: AC
  Administered 2016-07-18: 10 mg via ORAL
  Filled 2016-07-18: qty 1

## 2016-07-18 MED ORDER — CYCLOBENZAPRINE HCL 10 MG PO TABS: 10.0000 mg | ORAL_TABLET | Freq: Two times a day (BID) | ORAL | 0 refills | Status: DC | PRN

## 2016-07-18 MED ORDER — BUTALBITAL-APAP-CAFFEINE 50-325-40 MG PO TABS
1.0000 | ORAL_TABLET | Freq: Once | ORAL | Status: AC
  Administered 2016-07-18: 1 via ORAL
  Filled 2016-07-18: qty 1

## 2016-07-18 NOTE — MAU Note (Signed)
Pt reprots headache and back pain less and ready to go home

## 2016-07-18 NOTE — Discharge Instructions (Signed)
Back Pain in Pregnancy Introduction Back pain during pregnancy is common. Back pain may be caused by several factors that are related to changes during your pregnancy. Follow these instructions at home: Managing pain, stiffness, and swelling  If directed, apply ice for sudden (acute) back pain.  Put ice in a plastic bag.  Place a towel between your skin and the bag.  Leave the ice on for 20 minutes, 2-3 times per day.  If directed, apply heat to the affected area before you exercise:  Place a towel between your skin and the heat pack or heating pad.  Leave the heat on for 20-30 minutes.  Remove the heat if your skin turns bright red. This is especially important if you are unable to feel pain, heat, or cold. You may have a greater risk of getting burned. Activity  Exercise as told by your health care provider. Exercising is the best way to prevent or manage back pain.  Listen to your body when lifting. If lifting hurts, ask for help or bend your knees. This uses your leg muscles instead of your back muscles.  Squat down when picking up something from the floor. Do not bend over.  Only use bed rest as told by your health care provider. Bed rest should only be used for the most severe episodes of back pain. Standing, Sitting, and Lying Down  Do not stand in one place for long periods of time.  Use good posture when sitting. Make sure your head rests over your shoulders and is not hanging forward. Use a pillow on your lower back if necessary.  Try sleeping on your side, preferably the left side, with a pillow or two between your legs. If you are sore after a night's rest, your bed may be too soft. A firm mattress may provide more support for your back during pregnancy. General instructions  Do not wear high heels.  Eat a healthy diet. Try to gain weight within your health care provider's recommendations.  Use a maternity girdle, elastic sling, or back brace as told by your  health care provider.  Take over-the-counter and prescription medicines only as told by your health care provider.  Keep all follow-up visits as told by your health care provider. This is important. This includes any visits with any specialists, such as a physical therapist. Contact a health care provider if:  Your back pain interferes with your daily activities.  You have increasing pain in other parts of your body. Get help right away if:  You develop numbness, tingling, weakness, or problems with the use of your arms or legs.  You develop severe back pain that is not controlled with medicine.  You have a sudden change in bowel or bladder control.  You develop shortness of breath, dizziness, or you faint.  You develop nausea, vomiting, or sweating.  You have back pain that is a rhythmic, cramping pain similar to labor pains. Labor pain is usually 1-2 minutes apart, lasts for about 1 minute, and involves a bearing down feeling or pressure in your pelvis.  You have back pain and your water breaks or you have vaginal bleeding.  You have back pain or numbness that travels down your leg.  Your back pain developed after you fell.  You develop pain on one side of your back.  You see blood in your urine.  You develop skin blisters in the area of your back pain. This information is not intended to replace advice given to   you by your health care provider. Make sure you discuss any questions you have with your health care provider. Document Released: 09/23/2005 Document Revised: 11/21/2015 Document Reviewed: 02/27/2015  2017 Elsevier  

## 2016-07-18 NOTE — MAU Note (Signed)
Pt reports she has had back pain and abd cramping off and on since yesterday. Also has a headache that has not been relieved with tylenol.Has not had prenatal care. Unsure of dates. Had early U/S and was told due date was 11/16/2016. [facility] and they did a bedside and told her another due date of 10/28/2016. Pt also stated she feels SOB. Has had asthma in the past with pregnancy.

## 2016-07-18 NOTE — MAU Provider Note (Signed)
Chief Complaint:  Back Pain; Headache; Shortness of Breath; and Abdominal Cramping   First Provider Initiated Contact with Patient 07/18/16 1931     HPI: Robyn Williams is a 31 y.o. [id number] at 11w5dwho presents to maternity admissions reporting several complaints.  States has a new headache, which is somewhat worse than her usual headaches, not relieved by Tylenol.  Has had lower abdominal cramping entire pregnancy, but low back pain is new.  It is just below waist, center of back, near spine.  Hurts to lie flat, feels better sitting up.  No recent injuries.   Has some episodes of SOB, thinks may be due to asthma or possible panic attacks.  States her asthma flares during pregnancy, and "they have to give me an inhaler".  Does not really feel short of breath now.  She reports good fetal movement, denies LOF, vaginal bleeding, vaginal itching/burning, urinary symptoms, dizziness, n/v, diarrhea, constipation or fever/chills.    Back Pain  This is a new problem. The current episode started in the past 7 days. The problem occurs constantly. The problem is unchanged. The pain is present in the lumbar spine. The pain does not radiate. The pain is moderate. The pain is the same all the time. The symptoms are aggravated by lying down. Stiffness is present all day. Associated symptoms include headaches and pelvic pain. Pertinent negatives include no chest pain, dysuria, fever, leg pain, numbness, paresis, paresthesias, tingling or weakness. She has tried nothing for the symptoms.  Headache   This is a recurrent problem. The current episode started today. The problem occurs constantly. The problem has been unchanged. The pain is located in the frontal region. The pain does not radiate. The quality of the pain is described as aching. The pain is moderate. Associated symptoms include back pain and photophobia (slight). Pertinent negatives include no blurred vision, fever, muscle aches, nausea, neck pain, numbness,  sinus pressure, sore throat, tingling, visual change, vomiting or weakness. The symptoms are aggravated by bright light. She has tried acetaminophen for the symptoms. The treatment provided no relief.  Shortness of Breath  This is a recurrent problem. The current episode started 1 to 4 weeks ago. The problem occurs rarely. The problem has been resolved (not very short of breath now). Associated symptoms include headaches. Pertinent negatives include no chest pain, fever, leg pain, neck pain, sore throat, sputum production, syncope, vomiting or wheezing. The symptoms are aggravated by emotional upset. She has tried nothing for the symptoms. Her past medical history is significant for asthma.  Abdominal Cramping  This is a recurrent problem. The current episode started more than 1 month ago. The onset quality is gradual. The problem occurs intermittently. The problem has been waxing and waning. The pain is located in the suprapubic region, LLQ and RLQ. The pain is mild. The quality of the pain is cramping. The abdominal pain does not radiate. Associated symptoms include headaches. Pertinent negatives include no constipation, diarrhea, dysuria, fever, nausea or vomiting. Nothing aggravates the pain. The pain is relieved by nothing. She has tried nothing for the symptoms.   RN Note: Pt reports she has had back pain and abd cramping off and on since yesterday. Also has a headache that has not been relieved with tylenol.Has not had prenatal care. Unsure of dates. Had early U/S and was told due date was 11/16/2016. Went to [facility] a few months ago and they did a bedside and told her another due date of 10/28/2016. Pt also stated she  feels SOB. Has had asthma in the past with pregnancy  Past Medical History: Past Medical History:  Diagnosis Date  . Anemia   . Asthma    with pregnancy  . Chronic kidney disease    kidney stones  . Depression    doing good  . 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 Urinary tract  infection     Past obstetric history: OB History  Gravida Para Term Preterm AB Living  6 4 4  0 1 4  SAB TAB Ectopic Multiple Live Births  1 0 0 0 4    # Outcome Date GA Lbr Len/2nd Weight Sex Delivery Anes PTL Lv  6 Current           5 Term 11/04/11 5231w4d 00:37 / 00:03 6 lb 3 oz (2.807 kg) F Vag-Spont None  LIV     Birth Comments: wnl  4 Term 01/03/10    F Vag-Spont EPI N LIV  3 Term 01/12/06    F Vag-Spont EPI N LIV  2 Term 06/27/03    M Vag-Spont EPI N LIV  1 SAB               Past Surgical History: Past Surgical History:  Procedure Laterality Date  . NO PAST SURGERIES      Family History: Family History  Problem Relation Age of Onset  . Anesthesia problems Neg Hx   . Hearing loss Neg Hx     Social History: Social History  Substance Use Topics  . Smoking status: Current Some Day Smoker    Years: 2.00    Types: Cigarettes  . Smokeless tobacco: Never Used  . Alcohol use No    Allergies:  Allergies  Allergen Reactions  . Amoxicillin Other (See Comments)    "makes me feel weird on the inside"  . Peanut-Containing Drug Products Hives  . Phenergan [Promethazine Hcl] Hives  . Zofran [Ondansetron Hcl] Hives  . Latex Swelling and Rash    Meds:  Prescriptions Prior to Admission  Medication Sig Dispense Refill Last Dose  . doxylamine, Sleep, (UNISOM) 25 MG tablet Take 1 tablet (25 mg total) by mouth at bedtime. Take with prescribed Vitamin B6 for nausea. 30 tablet 0   . metoCLOPramide (REGLAN) 10 MG tablet Take 1 tablet (10 mg total) by mouth every 8 (eight) hours as needed for nausea. 15 tablet 0   . promethazine (PHENERGAN) 25 MG suppository Place 1 suppository (25 mg total) rectally every 6 (six) hours as needed for nausea. 12 suppository 1 unk  . promethazine (PHENERGAN) 25 MG tablet Take 1 tablet (25 mg total) by mouth every 6 (six) hours as needed for nausea or vomiting. 30 tablet 0 unk  . pyridOXINE (VITAMIN B-6) 50 MG tablet Take 1 tablet (50 mg total) by  mouth at bedtime. Take with prescribed doxylamine for nausea 30 tablet 0     I have reviewed patient's Past Medical Hx, Surgical Hx, Family Hx, Social Hx, medications and allergies.   ROS:  Review of Systems  Constitutional: Negative for fever.  HENT: Negative for sinus pressure and sore throat.   Eyes: Positive for photophobia (slight). Negative for blurred vision.  Respiratory: Positive for shortness of breath. Negative for sputum production and wheezing.   Cardiovascular: Negative for chest pain and syncope.  Gastrointestinal: Negative for constipation, diarrhea, nausea and vomiting.  Genitourinary: Positive for pelvic pain. Negative for dysuria.  Musculoskeletal: Positive for back pain. Negative for neck pain.  Neurological: Positive  for headaches. Negative for tingling, weakness, numbness and paresthesias.   Other systems negative  Physical Exam  Patient Vitals for the past 24 hrs:  BP Temp Pulse Resp SpO2  07/18/16 1925 - - - - 100 %  07/18/16 1919 107/67 98 F (36.7 C) 108 18 -   Constitutional: Well-developed, well-nourished female in no acute distress.  Cardiovascular: normal rate and rhythm Respiratory: normal effort, clear to auscultation bilaterally GI: Abd soft, non-tender, gravid appropriate for gestational age.   No rebound or guarding. MS: Extremities nontender, no edema, normal ROM        Tender over paraspinous muscles around L1-2 Neurologic: Alert and oriented x 4.  GU: Neg CVAT.  PELVIC EXAM: Cervix firm, posterior, neg CMT, uterus nontender, Fundal Height consistent with dates, adnexa without tenderness, enlargement, or mass    FHT:  Baseline 152 Contractions: Rare   Labs: Results for orders placed or performed during the hospital encounter of 07/18/16 (from the past 24 hour(s))  Urinalysis, Routine w reflex microscopic     Status: None   Collection Time: 07/18/16  7:05 PM  Result Value Ref Range   Color, Urine YELLOW YELLOW   APPearance CLEAR CLEAR    Specific Gravity, Urine 1.024 1.005 - 1.030   pH 6.0 5.0 - 8.0   Glucose, UA NEGATIVE NEGATIVE mg/dL   Hgb urine dipstick NEGATIVE NEGATIVE   Bilirubin Urine NEGATIVE NEGATIVE   Ketones, ur NEGATIVE NEGATIVE mg/dL   Protein, ur NEGATIVE NEGATIVE mg/dL   Nitrite NEGATIVE NEGATIVE   Leukocytes, UA NEGATIVE NEGATIVE      Imaging:  No results found.  MAU Course/MDM: I have ordered labs and reviewed results. UA is negative, no evidence of kidney stones or infection Will monitor Toco for irritability Treatments in MAU included Fioricet for headache.  May want to try Flexeril for back pain..    Assessment: Single IUP at [redacted]w[redacted]d by [redacted]w[redacted]d [place] Headache Low back pain Chronic pelvic cramping Reports intermittent shortness of breath but no evidence of that, saturation normal, breathing unlabored Flexeril and ibuprofen given  Headache 0/10 Back pain 4/10 down from 8/10 Patient sleeping soundly in the bed.   Plan: Care turned over to oncoming provider Discharge home in stable condition Rx: Flexeril  Return to MAU if symptoms worsen  Start prenatal care    Wynelle Bourgeois CNM, MSN Certified Nurse-Midwife 07/18/2016 7:31 PM   Duane Lope, NP 07/18/2016 11:25 PM

## 2016-09-14 ENCOUNTER — Encounter: Payer: Self-pay | Admitting: Certified Nurse Midwife

## 2016-09-14 ENCOUNTER — Ambulatory Visit (INDEPENDENT_AMBULATORY_CARE_PROVIDER_SITE_OTHER): Payer: Medicaid Other | Admitting: Certified Nurse Midwife

## 2016-09-14 VITALS — BP 112/67 | HR 106 | Ht 65.0 in | Wt 162.1 lb

## 2016-09-14 DIAGNOSIS — Z3483 Encounter for supervision of other normal pregnancy, third trimester: Secondary | ICD-10-CM

## 2016-09-14 DIAGNOSIS — Z124 Encounter for screening for malignant neoplasm of cervix: Secondary | ICD-10-CM

## 2016-09-14 DIAGNOSIS — Z113 Encounter for screening for infections with a predominantly sexual mode of transmission: Secondary | ICD-10-CM

## 2016-09-14 DIAGNOSIS — O0933 Supervision of pregnancy with insufficient antenatal care, third trimester: Secondary | ICD-10-CM | POA: Insufficient documentation

## 2016-09-14 DIAGNOSIS — Z3481 Encounter for supervision of other normal pregnancy, first trimester: Secondary | ICD-10-CM | POA: Diagnosis not present

## 2016-09-14 DIAGNOSIS — Z131 Encounter for screening for diabetes mellitus: Secondary | ICD-10-CM

## 2016-09-14 DIAGNOSIS — Z3689 Encounter for other specified antenatal screening: Secondary | ICD-10-CM

## 2016-09-14 MED ORDER — PRENATE PIXIE 10-0.6-0.4-200 MG PO CAPS: 10.0000 mg | ORAL_CAPSULE | Freq: Every day | ORAL | 11 refills | Status: DC

## 2016-09-14 NOTE — Progress Notes (Signed)
NEW OB HISTORY AND PHYSICAL  SUBJECTIVE:       Robyn Williams is a 31 y.o. [phone] female, Patient's last menstrual period was 03/16/2016 (within months)., Estimated Date of Delivery: 11/16/16, 276w0d, presents today for establishment of Prenatal Care. She is dated by a first trimester ultrasound (8+1 wks) completed at [facility] on 04/07/2016.  She endorses fetal movement and breast tenderness. She reports occasional contractions that stop with rest and round ligament pain.   Denies difficulty breathing or respiratory distress, chest pain, abdominal pain, vaginal bleeding, leakage of fluid, and leg pain or swelling.   She and her significant other have four (4) other children. [place] in [place]. [place]. [place] and her mother advised for her to look for care here.   She reports occasional use of ETOH and THC. Last ETOH was one (1) month ago, "a glass of pink wine". Last THC was three (3) weeks ago.    Gynecologic History  Patient's last menstrual period was 03/16/2016 (within months).   Last Pap: 2013. Results were: normal  Obstetric History OB History  Gravida Para Term Preterm AB Living  6 4 4  0 1 4  SAB TAB Ectopic Multiple Live Births  1 0 0 0 4    # Outcome Date GA Lbr Len/2nd Weight Sex Delivery Anes PTL Lv  6 Current           5 Term 11/04/11 391w4d 00:37 / 00:03 6 lb 3 oz (2.807 kg) F Vag-Spont None  LIV     Birth Comments: wnl  4 Term 01/03/10    F Vag-Spont EPI N LIV  3 Term 01/12/06    F Vag-Spont EPI N LIV  2 Term 06/27/03    M Vag-Spont EPI N LIV  1 SAB               Past Medical History:  Diagnosis Date  . Anemia   . Asthma    with pregnancy  . Chronic kidney disease    kidney stones  . Depression    doing good  . 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 Urinary tract infection     Past Surgical History:  Procedure Laterality Date  . NO PAST SURGERIES      No current outpatient  prescriptions on file prior to visit.   No current facility-administered medications on file prior to visit.     Allergies  Allergen Reactions  . Amoxicillin Other (See Comments)    "makes me feel weird on the inside"  . Peanut-Containing Drug Products Hives  . Phenergan [Promethazine Hcl] Hives  . Zofran [Ondansetron Hcl] Hives  . Latex Swelling and Rash    Social History   Social History  . Marital status: Married    Spouse name: N/A  . Number of children: N/A  . Years of education: N/A   Occupational History  . Not on file.   Social History Main Topics  . Smoking status: Current Some Day Smoker    Years: 0.50    Types: Cigarettes  . Smokeless tobacco: Never Used  . Alcohol use Yes     Comment: Sparingly   . Drug use: Yes    Types: Marijuana     Comment: last use 3 weeks ago  . Sexual activity: Yes    Birth control/ protection: None   Other Topics Concern  . Not on file   Social History  Narrative  . No narrative on file    Family History  Problem Relation Age of Onset  . Hypertension Mother   . Cancer Paternal Grandmother   . Anesthesia problems Neg Hx   . Hearing loss Neg Hx   . Asthma Neg Hx   . Diabetes Neg Hx     The following portions of the patient's history were reviewed and updated as appropriate: allergies, current medications, past OB history, past medical history, past surgical history, past family history, past social history, and problem list.    OBJECTIVE: Initial Physical Exam (New OB)  GENERAL APPEARANCE: alert, well appearing, in no apparent distress  HEAD: normocephalic, atraumatic  MOUTH: mucous membranes moist, pharynx normal without lesions  THYROID: no thyromegaly or masses present  BREASTS: no masses noted, no significant tenderness, no palpable axillary nodes, no skin changes  LUNGS: clear to auscultation, no wheezes, rales or rhonchi, symmetric air entry  HEART: regular rate and rhythm, no murmurs  ABDOMEN: soft,  nontender, nondistended, no abnormal masses, no epigastric pain, fundus soft, nontender 30 weeks size and FHT present  EXTREMITIES: no redness or tenderness in the calves or thighs, no edema  SKIN: normal coloration and turgor, no rashes  LYMPH NODES: no adenopathy palpable  NEUROLOGIC: alert, oriented, normal speech, no focal findings or movement disorder noted  PELVIC EXAM EXTERNAL GENITALIA: normal appearing vulva with no masses, tenderness or lesions VAGINA: no abnormal discharge or lesions CERVIX: no lesions or cervical motion tenderness UTERUS: gravid and consistent with 30 weeks ADNEXA: no masses palpable and nontender  ASSESSMENT: Normal pregnancy Late entry to prenatal care Substance use affecting pregnancy Need for gestational diabetes screening Need for cervical cancer screening  PLAN: Prenatal care PAP and NuSwab collected New OB labs today Anatomy scan and one (1) hour glucose ASAP Encouraged prenatal vitamin with folic acid and DHA Discussed substance use in pregnancy and associated risks Reviewed red flag symptoms and when to call RTC x1-2 weeks for ROB See orders   Gunnar Bulla, CNM

## 2016-09-14 NOTE — Patient Instructions (Signed)

## 2016-09-14 NOTE — Addendum Note (Signed)
Addended by: Frankey ShownGLANTON, Rodrigo Mcgranahan C on: 09/14/2016 04:37 PM   Modules accepted: Orders

## 2016-09-15 LAB — CBC WITH DIFFERENTIAL/PLATELET
BASOS: 1 %
Basophils Absolute: 0 10*3/uL (ref 0.0–0.2)
EOS (ABSOLUTE): 0.1 10*3/uL (ref 0.0–0.4)
EOS: 2 %
HEMATOCRIT: 33.7 % — AB (ref 34.0–46.6)
Hemoglobin: 11.1 g/dL (ref 11.1–15.9)
IMMATURE GRANS (ABS): 0.1 10*3/uL (ref 0.0–0.1)
IMMATURE GRANULOCYTES: 1 %
LYMPHS: 44 %
Lymphocytes Absolute: 2.7 10*3/uL (ref 0.7–3.1)
MCH: 33.8 pg — ABNORMAL HIGH (ref 26.6–33.0)
MCHC: 32.9 g/dL (ref 31.5–35.7)
MCV: 103 fL — AB (ref 79–97)
MONOCYTES: 6 %
Monocytes Absolute: 0.4 10*3/uL (ref 0.1–0.9)
NEUTROS PCT: 46 %
Neutrophils Absolute: 2.9 10*3/uL (ref 1.4–7.0)
Platelets: 233 10*3/uL (ref 150–379)
RBC: 3.28 x10E6/uL — AB (ref 3.77–5.28)
RDW: 13.5 % (ref 12.3–15.4)
WBC: 6.1 10*3/uL (ref 3.4–10.8)

## 2016-09-15 LAB — URINALYSIS, ROUTINE W REFLEX MICROSCOPIC
BILIRUBIN UA: NEGATIVE
GLUCOSE, UA: NEGATIVE
Ketones, UA: NEGATIVE
LEUKOCYTES UA: NEGATIVE
Nitrite, UA: NEGATIVE
PROTEIN UA: NEGATIVE
RBC UA: NEGATIVE
SPEC GRAV UA: 1.024 (ref 1.005–1.030)
UUROB: 1 mg/dL (ref 0.2–1.0)
pH, UA: 7 (ref 5.0–7.5)

## 2016-09-15 LAB — PAIN MGT SCRN (14 DRUGS), UR
Amphetamine Screen, Ur: NEGATIVE ng/mL
BUPRENORPHINE, URINE: NEGATIVE ng/mL
Barbiturate Screen, Ur: NEGATIVE ng/mL
Benzodiazepine Screen, Urine: NEGATIVE ng/mL
CREATININE(CRT), U: 120 mg/dL (ref 20.0–300.0)
Cannabinoids Ur Ql Scn: NEGATIVE ng/mL
Cocaine(Metab.)Screen, Urine: NEGATIVE ng/mL
Fentanyl, Urine: NEGATIVE pg/mL
METHADONE SCREEN, URINE: NEGATIVE ng/mL
Meperidine Screen, Urine: NEGATIVE ng/mL
Opiate Scrn, Ur: NEGATIVE ng/mL
Oxycodone+Oxymorphone Ur Ql Scn: NEGATIVE ng/mL
PCP SCRN UR: NEGATIVE ng/mL
PH UR, DRUG SCRN: 6.7 (ref 4.5–8.9)
PROPOXYPHENE SCREEN: NEGATIVE ng/mL
Tramadol Ur Ql Scn: NEGATIVE ng/mL

## 2016-09-15 LAB — SICKLE CELL SCREEN: SICKLE CELL SCREEN: NEGATIVE

## 2016-09-15 LAB — ABO AND RH: Rh Factor: POSITIVE

## 2016-09-15 LAB — HIV ANTIBODY (ROUTINE TESTING W REFLEX): HIV SCREEN 4TH GENERATION: NONREACTIVE

## 2016-09-15 LAB — ANTIBODY SCREEN: Antibody Screen: NEGATIVE

## 2016-09-15 LAB — RPR: RPR Ser Ql: NONREACTIVE

## 2016-09-15 LAB — RUBELLA SCREEN: RUBELLA: 8.79 {index} (ref >0.99)

## 2016-09-15 LAB — VARICELLA ZOSTER ANTIBODY, IGG: VARICELLA: 2197 {index} (ref >165)

## 2016-09-15 LAB — HEPATITIS B SURFACE ANTIGEN: Hepatitis B Surface Ag: NEGATIVE

## 2016-09-16 ENCOUNTER — Ambulatory Visit (INDEPENDENT_AMBULATORY_CARE_PROVIDER_SITE_OTHER): Payer: Medicaid Other

## 2016-09-16 DIAGNOSIS — Z3483 Encounter for supervision of other normal pregnancy, third trimester: Secondary | ICD-10-CM

## 2016-09-16 DIAGNOSIS — O0933 Supervision of pregnancy with insufficient antenatal care, third trimester: Secondary | ICD-10-CM

## 2016-09-16 DIAGNOSIS — Z3689 Encounter for other specified antenatal screening: Secondary | ICD-10-CM | POA: Diagnosis not present

## 2016-09-16 LAB — NUSWAB VAGINITIS PLUS (VG+)
Atopobium vaginae: HIGH Score — AB
BVAB 2: HIGH {score} — AB
CHLAMYDIA TRACHOMATIS, NAA: NEGATIVE
Candida albicans, NAA: NEGATIVE
Candida glabrata, NAA: NEGATIVE
MEGASPHAERA 1: HIGH {score} — AB
Neisseria gonorrhoeae, NAA: NEGATIVE
Trich vag by NAA: NEGATIVE

## 2016-09-16 LAB — URINE CULTURE

## 2016-09-17 ENCOUNTER — Other Ambulatory Visit: Payer: Self-pay | Admitting: Certified Nurse Midwife

## 2016-09-17 LAB — PAP IG, CT-NG, RFX HPV ASCU
Chlamydia, Nuc. Acid Amp: NEGATIVE
GONOCOCCUS BY NUCLEIC ACID AMP: NEGATIVE
PAP Smear Comment: 0

## 2016-09-17 MED ORDER — METRONIDAZOLE 500 MG PO TABS: 500.0000 mg | ORAL_TABLET | Freq: Two times a day (BID) | ORAL | 0 refills | Status: AC

## 2016-09-24 ENCOUNTER — Encounter (HOSPITAL_COMMUNITY): Payer: Self-pay

## 2016-09-24 ENCOUNTER — Other Ambulatory Visit: Payer: Self-pay | Admitting: Certified Nurse Midwife

## 2016-09-24 ENCOUNTER — Inpatient Hospital Stay (HOSPITAL_COMMUNITY)
Admission: AD | Admit: 2016-09-24 | Discharge: 2016-09-24 | Disposition: A | Discharge status: Home / Self Care | Payer: PRIVATE HEALTH INSURANCE | Source: Ambulatory Visit | Attending: Obstetrics & Gynecology | Admitting: Obstetrics & Gynecology

## 2016-09-24 DIAGNOSIS — J45909 Unspecified asthma, uncomplicated: Secondary | ICD-10-CM | POA: Diagnosis not present

## 2016-09-24 DIAGNOSIS — Z79899 Other long term (current) drug therapy: Secondary | ICD-10-CM | POA: Insufficient documentation

## 2016-09-24 DIAGNOSIS — N189 Chronic kidney disease, unspecified: Secondary | ICD-10-CM | POA: Diagnosis not present

## 2016-09-24 DIAGNOSIS — O4703 False labor before 37 completed weeks of gestation, third trimester: Secondary | ICD-10-CM | POA: Diagnosis not present

## 2016-09-24 DIAGNOSIS — O99333 Smoking (tobacco) complicating pregnancy, third trimester: Secondary | ICD-10-CM | POA: Diagnosis not present

## 2016-09-24 DIAGNOSIS — R102 Pelvic and perineal pain: Secondary | ICD-10-CM | POA: Insufficient documentation

## 2016-09-24 DIAGNOSIS — O26833 Pregnancy related renal disease, third trimester: Secondary | ICD-10-CM | POA: Insufficient documentation

## 2016-09-24 DIAGNOSIS — O99343 Other mental disorders complicating pregnancy, third trimester: Secondary | ICD-10-CM | POA: Diagnosis not present

## 2016-09-24 DIAGNOSIS — Z3A32 32 weeks gestation of pregnancy: Secondary | ICD-10-CM | POA: Diagnosis not present

## 2016-09-24 DIAGNOSIS — Z880 Allergy status to penicillin: Secondary | ICD-10-CM | POA: Insufficient documentation

## 2016-09-24 DIAGNOSIS — O26893 Other specified pregnancy related conditions, third trimester: Secondary | ICD-10-CM | POA: Insufficient documentation

## 2016-09-24 DIAGNOSIS — O99513 Diseases of the respiratory system complicating pregnancy, third trimester: Secondary | ICD-10-CM | POA: Diagnosis not present

## 2016-09-24 DIAGNOSIS — F17210 Nicotine dependence, cigarettes, uncomplicated: Secondary | ICD-10-CM | POA: Diagnosis not present

## 2016-09-24 DIAGNOSIS — F329 Major depressive disorder, single episode, unspecified: Secondary | ICD-10-CM | POA: Diagnosis not present

## 2016-09-24 DIAGNOSIS — Z0489 Encounter for examination and observation for other specified reasons: Secondary | ICD-10-CM

## 2016-09-24 DIAGNOSIS — IMO0002 Reserved for concepts with insufficient information to code with codable children: Secondary | ICD-10-CM

## 2016-09-24 LAB — URINALYSIS, ROUTINE W REFLEX MICROSCOPIC
Bacteria, UA: NONE SEEN
Bilirubin Urine: NEGATIVE
Glucose, UA: NEGATIVE mg/dL
Hgb urine dipstick: NEGATIVE
Ketones, ur: NEGATIVE mg/dL
Nitrite: NEGATIVE
Protein, ur: NEGATIVE mg/dL
RBC / HPF: NONE SEEN RBC/hpf (ref 0–5)
Specific Gravity, Urine: 1.021 (ref 1.005–1.030)
pH: 6 (ref 5.0–8.0)

## 2016-09-24 LAB — FETAL FIBRONECTIN: FETAL FIBRONECTIN: NEGATIVE

## 2016-09-24 LAB — WET PREP, GENITAL
Sperm: NONE SEEN
Trich, Wet Prep: NONE SEEN
Yeast Wet Prep HPF POC: NONE SEEN

## 2016-09-24 LAB — GC/CHLAMYDIA PROBE AMP (~~LOC~~) NOT AT ARMC
Chlamydia: NEGATIVE
Neisseria Gonorrhea: NEGATIVE

## 2016-09-24 MED ORDER — NIFEDIPINE 10 MG PO CAPS
10.0000 mg | ORAL_CAPSULE | ORAL | Status: AC
  Administered 2016-09-24 (×3): 10 mg via ORAL
  Filled 2016-09-24 (×3): qty 1

## 2016-09-24 NOTE — MAU Provider Note (Signed)
History     CSN: [id number]  Arrival date and time: 09/24/16 7829   First Provider Initiated Contact with Patient 09/24/16 [phone]      Chief Complaint  Patient presents with  . Pelvic Pain  . Contractions   Robyn Williams is a 31 y.o. [phone] at [redacted]w[redacted]d who presents today with contractions. She states that the contractions began around 0100 today, and she reports them about every 5 mins. She states that she just started flagyl for BV, and she has taken 2 days of the 7 day course. She denies any VB or LOF. She reports normal fetal movement. She denies any complications with this pregnancy at this time. She denies any hx of preterm labor or birth.    Pelvic Pain  The patient's primary symptoms include pelvic pain. The patient's pertinent negatives include no vaginal discharge. This is a new problem. The current episode started today. The problem occurs intermittently. The problem has been unchanged. Pain severity now: 8/10  The problem affects both sides. She is pregnant. Associated symptoms include abdominal pain. Pertinent negatives include no chills, constipation, dysuria, fever, frequency, nausea, urgency or vomiting. The vaginal discharge was normal. There has been no bleeding. Nothing aggravates the symptoms. She has tried nothing for the symptoms. Sexual activity: No intercourse in the last 24 hours.      Past Medical History:  Diagnosis Date  . Anemia   . Asthma    with pregnancy  . Chronic kidney disease    kidney stones  . Depression    doing good  . Urinary tract infection     Past Surgical History:  Procedure Laterality Date  . NO PAST SURGERIES      Family History  Problem Relation Age of Onset  . Hypertension Mother   . Cancer Paternal Grandmother   . Anesthesia problems Neg Hx   . Hearing loss Neg Hx   . Asthma Neg Hx   . Diabetes Neg Hx     Social History  Substance Use Topics  . Smoking status: Current Some Day Smoker    Years: 0.50    Types:  Cigarettes  . Smokeless tobacco: Never Used  . Alcohol use Yes     Comment: Sparingly     Allergies:  Allergies  Allergen Reactions  . Amoxicillin Other (See Comments)    "makes me feel weird on the inside"  . Peanut-Containing Drug Products Hives  . Phenergan [Promethazine Hcl] Hives  . Zofran [Ondansetron Hcl] Hives  . Latex Swelling and Rash    Prescriptions Prior to Admission  Medication Sig Dispense Refill Last Dose  . metroNIDAZOLE (FLAGYL) 500 MG tablet Take 1 tablet (500 mg total) by mouth 2 (two) times daily. 14 tablet 0   . Prenat-FeAsp-Meth-FA-DHA w/o A (PRENATE PIXIE) 10-0.6-0.4-200 MG CAPS Take 10 mg by mouth daily. 30 capsule 11     Review of Systems  Constitutional: Negative for chills and fever.  Gastrointestinal: Positive for abdominal pain. Negative for constipation, nausea and vomiting.  Genitourinary: Positive for pelvic pain. Negative for dysuria, frequency, urgency, vaginal bleeding and vaginal discharge.   Physical Exam   Blood pressure 102/69, pulse (!) 104, resp. rate 19, last menstrual period 03/16/2016.  Physical Exam  Nursing note and vitals reviewed. Constitutional: She is oriented to person, place, and time. She appears well-developed and well-nourished. No distress.  HENT:  Head: Normocephalic.  Cardiovascular: Normal rate.   Respiratory: Effort normal.  GI: Soft. There is no tenderness. There is no  rebound.  Genitourinary:  Genitourinary Comments: Cervix: 1/thick/-3/vtx.   Neurological: She is alert and oriented to person, place, and time.  Skin: Skin is warm and dry.  Psychiatric: She has a normal mood and affect.  FHT: 145, moderate with 15x15 accels, no decels Toco: about every 3-6 mins   Results for orders placed or performed during the hospital encounter of 09/24/16 (from the past 24 hour(s))  Urinalysis, Routine w reflex microscopic     Status: Abnormal   Collection Time: 09/24/16  4:23 AM  Result Value Ref Range   Color, Urine  YELLOW YELLOW   APPearance CLEAR CLEAR   Specific Gravity, Urine 1.021 1.005 - 1.030   pH 6.0 5.0 - 8.0   Glucose, UA NEGATIVE NEGATIVE mg/dL   Hgb urine dipstick NEGATIVE NEGATIVE   Bilirubin Urine NEGATIVE NEGATIVE   Ketones, ur NEGATIVE NEGATIVE mg/dL   Protein, ur NEGATIVE NEGATIVE mg/dL   Nitrite NEGATIVE NEGATIVE   Leukocytes, UA TRACE (A) NEGATIVE   RBC / HPF NONE SEEN 0 - 5 RBC/hpf   WBC, UA 0-5 0 - 5 WBC/hpf   Bacteria, UA NONE SEEN NONE SEEN   Squamous Epithelial / LPF 0-5 (A) NONE SEEN   Mucous PRESENT   Fetal fibronectin     Status: None   Collection Time: 09/24/16  4:44 AM  Result Value Ref Range   Fetal Fibronectin NEGATIVE NEGATIVE  Wet prep, genital     Status: Abnormal   Collection Time: 09/24/16  4:44 AM  Result Value Ref Range   Yeast Wet Prep HPF POC NONE SEEN NONE SEEN   Trich, Wet Prep NONE SEEN NONE SEEN   Clue Cells Wet Prep HPF POC PRESENT (A) NONE SEEN   WBC, Wet Prep HPF POC MODERATE (A) NONE SEEN   Sperm NONE SEEN      MAU Course  Procedures  MDM Will give procardia and recheck in one hour.  FFN negative Contractions have spaced with procardia. Patient sleeping in room.  Repeat cervical exam after one hour: no change 1/thick/-3  Assessment and Plan   1. Threatened premature labor in third trimester   2. [redacted] weeks gestation of pregnancy    DC home Comfort measures reviewed  3rd Trimester precautions  PTL precautions  Fetal kick counts RX: none  Return to MAU as needed FU with OB as planned  Follow-up Information    Serafina RoyalsMichelle Lawhorn, CNM Follow up.   Specialties:  Certified Nurse Midwife, Obstetrics and Gynecology, Radiology Contact information: [address] [place] [id number] [phone]            Tawnya CrookHogan, Heather Donovan 09/24/2016, 4:43 AM

## 2016-09-24 NOTE — Discharge Instructions (Signed)
Preterm Labor and Birth Information °Pregnancy normally lasts 39-41 weeks. Preterm labor is when labor starts early. It starts before you have been pregnant for 37 whole weeks. °What are the risk factors for preterm labor? °Preterm labor is more likely to occur in women who: °· Have an infection while pregnant. °· Have a cervix that is short. °· Have gone into preterm labor before. °· Have had surgery on their cervix. °· Are younger than age 29. °· Are older than age 35. °· Are African American. °· Are pregnant with two or more babies. °· Take street drugs while pregnant. °· Smoke while pregnant. °· Do not gain enough weight while pregnant. °· Got pregnant right after another pregnancy. °What are the symptoms of preterm labor? °Symptoms of preterm labor include: °· Cramps. The cramps may feel like the cramps some women get during their period. The cramps may happen with watery poop (diarrhea). °· Pain in the belly (abdomen). °· Pain in the lower back. °· Regular contractions or tightening. It may feel like your belly is getting tighter. °· Pressure in the lower belly that seems to get stronger. °· More fluid (discharge) leaking from the vagina. The fluid may be watery or bloody. °· Water breaking. °Why is it important to notice signs of preterm labor? °Babies who are born early may not be fully developed. They have a higher chance for: °· Long-term heart problems. °· Long-term lung problems. °· Trouble controlling body systems, like breathing. °· Bleeding in the brain. °· A condition called cerebral palsy. °· Learning difficulties. °· Death. °These risks are highest for babies who are born before 34 weeks of pregnancy. °How is preterm labor treated? °Treatment depends on: °· How long you were pregnant. °· Your condition. °· The health of your baby. °Treatment may involve: °· Having a stitch (suture) placed in your cervix. When you give birth, your cervix opens so the baby can come out. The stitch keeps the cervix  from opening too soon. °· Staying at the hospital. °· Taking or getting medicines, such as: °¨ Hormone medicines. °¨ Medicines to stop contractions. °¨ Medicines to help the baby’s lungs develop. °¨ Medicines to prevent your baby from having cerebral palsy. °What should I do if I am in preterm labor? °If you think you are going into labor too soon, call your doctor right away. °How can I prevent preterm labor? °· Do not use any tobacco products. °¨ Examples of these are cigarettes, chewing tobacco, and e-cigarettes. °¨ If you need help quitting, ask your doctor. °· Do not use street drugs. °· Do not use any medicines unless you ask your doctor if they are safe for you. °· Talk with your doctor before taking any herbal supplements. °· Make sure you gain enough weight. °· Watch for infection. If you think you might have an infection, get it checked right away. °· If you have gone into preterm labor before, tell your doctor. °This information is not intended to replace advice given to you by your health care provider. Make sure you discuss any questions you have with your health care provider. °Document Released: 09/11/2008 Document Revised: 11/26/2015 Document Reviewed: 11/06/2015 °Elsevier Interactive Patient Education © 2017 [employer] ° °

## 2016-09-24 NOTE — MAU Note (Signed)
Patient presents with complaints of ctx that began at 0100, occurring every 3-5 minutes.  Pain 8/10.  Reports no LOF or VB.  Good FM.

## 2016-09-28 ENCOUNTER — Ambulatory Visit (INDEPENDENT_AMBULATORY_CARE_PROVIDER_SITE_OTHER): Payer: Medicaid Other | Admitting: Certified Nurse Midwife

## 2016-09-28 ENCOUNTER — Encounter: Payer: Medicaid Other | Admitting: Certified Nurse Midwife

## 2016-09-28 ENCOUNTER — Ambulatory Visit (INDEPENDENT_AMBULATORY_CARE_PROVIDER_SITE_OTHER): Payer: Medicaid Other

## 2016-09-28 ENCOUNTER — Encounter: Payer: Self-pay | Admitting: Certified Nurse Midwife

## 2016-09-28 ENCOUNTER — Other Ambulatory Visit: Payer: Self-pay | Admitting: Certified Nurse Midwife

## 2016-09-28 ENCOUNTER — Other Ambulatory Visit: Payer: Medicaid Other

## 2016-09-28 VITALS — BP 92/54 | HR 97 | Wt 171.4 lb

## 2016-09-28 DIAGNOSIS — Z362 Encounter for other antenatal screening follow-up: Secondary | ICD-10-CM

## 2016-09-28 DIAGNOSIS — Z130 Encounter for screening for diseases of the blood and blood-forming organs and certain disorders involving the immune mechanism: Secondary | ICD-10-CM

## 2016-09-28 DIAGNOSIS — IMO0002 Reserved for concepts with insufficient information to code with codable children: Secondary | ICD-10-CM

## 2016-09-28 DIAGNOSIS — Z23 Encounter for immunization: Secondary | ICD-10-CM

## 2016-09-28 DIAGNOSIS — Z3483 Encounter for supervision of other normal pregnancy, third trimester: Secondary | ICD-10-CM

## 2016-09-28 DIAGNOSIS — Z0489 Encounter for examination and observation for other specified reasons: Secondary | ICD-10-CM

## 2016-09-28 DIAGNOSIS — Z131 Encounter for screening for diabetes mellitus: Secondary | ICD-10-CM

## 2016-09-28 LAB — POCT URINALYSIS DIPSTICK
Glucose, UA: NEGATIVE
KETONES UA: NEGATIVE
LEUKOCYTES UA: NEGATIVE
NITRITE UA: NEGATIVE
Spec Grav, UA: 1.02 (ref 1.030–1.035)
Urobilinogen, UA: 1 (ref ?–2.0)
pH, UA: 6 (ref 5.0–8.0)

## 2016-09-28 NOTE — Patient Instructions (Addendum)
Tdap Vaccine (Tetanus, Diphtheria and Pertussis): What You Need to Know 1. Why get vaccinated? Tetanus, diphtheria and pertussis are very serious diseases. Tdap vaccine can protect us from these diseases. And, Tdap vaccine given to pregnant women can protect newborn babies against pertussis. TETANUS (Lockjaw) is rare in the [place] today. It causes painful muscle tightening and stiffness, usually all over the body.  It can lead to tightening of muscles in the head and neck so you can't open your mouth, swallow, or sometimes even breathe. Tetanus kills about 1 out of 10 people who are infected even after receiving the best medical care.  DIPHTHERIA is also rare in the [place] today. It can cause a thick coating to form in the back of the throat.  It can lead to breathing problems, heart failure, paralysis, and death.  PERTUSSIS (Whooping Cough) causes severe coughing spells, which can cause difficulty breathing, vomiting and disturbed sleep.  It can also lead to weight loss, incontinence, and rib fractures. Up to 2 in 100 adolescents and 5 in 100 adults with pertussis are hospitalized or have complications, which could include pneumonia or death.  These diseases are caused by bacteria. Diphtheria and pertussis are spread from person to person through secretions from coughing or sneezing. Tetanus enters the body through cuts, scratches, or wounds. Before vaccines, as many as 200,000 cases of diphtheria, 200,000 cases of pertussis, and hundreds of cases of tetanus, were reported in the [place] each year. Since vaccination began, reports of cases for tetanus and diphtheria have dropped by about 99% and for pertussis by about 80%. 2. Tdap vaccine Tdap vaccine can protect adolescents and adults from tetanus, diphtheria, and pertussis. One dose of Tdap is routinely given at age 11 or 12. People who did not get Tdap at that age should get it as soon as possible. Tdap is especially  important for healthcare professionals and anyone having close contact with a baby younger than 12 months. Pregnant women should get a dose of Tdap during every pregnancy, to protect the newborn from pertussis. Infants are most at risk for severe, life-threatening complications from pertussis. Another vaccine, called Td, protects against tetanus and diphtheria, but not pertussis. A Td booster should be given every 10 years. Tdap may be given as one of these boosters if you have never gotten Tdap before. Tdap may also be given after a severe cut or burn to prevent tetanus infection. Your doctor or the person giving you the vaccine can give you more information. Tdap may safely be given at the same time as other vaccines. 3. Some people should not get this vaccine  A person who has ever had a life-threatening allergic reaction after a previous dose of any diphtheria, tetanus or pertussis containing vaccine, OR has a severe allergy to any part of this vaccine, should not get Tdap vaccine. Tell the person giving the vaccine about any severe allergies.  Anyone who had coma or long repeated seizures within 7 days after a childhood dose of DTP or DTaP, or a previous dose of Tdap, should not get Tdap, unless a cause other than the vaccine was found. They can still get Td.  Talk to your doctor if you: ? have seizures or another nervous system problem, ? had severe pain or swelling after any vaccine containing diphtheria, tetanus or pertussis, ? ever had a condition called Guillain-Barr Syndrome (GBS), ? aren't feeling well on the day the shot is scheduled. 4. Risks With any medicine, including   vaccines, there is a chance of side effects. These are usually mild and go away on their own. Serious reactions are also possible but are rare. Most people who get Tdap vaccine do not have any problems with it. Mild problems following Tdap: (Did not interfere with activities)  Pain where the shot was given (about  3 in 4 adolescents or 2 in 3 adults)  Redness or swelling where the shot was given (about 1 person in 5)  Mild fever of at least 100.4F (up to about 1 in 25 adolescents or 1 in 100 adults)  Headache (about 3 or 4 people in 10)  Tiredness (about 1 person in 3 or 4)  Nausea, vomiting, diarrhea, stomach ache (up to 1 in 4 adolescents or 1 in 10 adults)  Chills, sore joints (about 1 person in 10)  Body aches (about 1 person in 3 or 4)  Rash, swollen glands (uncommon)  Moderate problems following Tdap: (Interfered with activities, but did not require medical attention)  Pain where the shot was given (up to 1 in 5 or 6)  Redness or swelling where the shot was given (up to about 1 in 16 adolescents or 1 in 12 adults)  Fever over 102F (about 1 in 100 adolescents or 1 in 250 adults)  Headache (about 1 in 7 adolescents or 1 in 10 adults)  Nausea, vomiting, diarrhea, stomach ache (up to 1 or 3 people in 100)  Swelling of the entire arm where the shot was given (up to about 1 in 500).  Severe problems following Tdap: (Unable to perform usual activities; required medical attention)  Swelling, severe pain, bleeding and redness in the arm where the shot was given (rare).  Problems that could happen after any vaccine:  People sometimes faint after a medical procedure, including vaccination. Sitting or lying down for about 15 minutes can help prevent fainting, and injuries caused by a fall. Tell your doctor if you feel dizzy, or have vision changes or ringing in the ears.  Some people get severe pain in the shoulder and have difficulty moving the arm where a shot was given. This happens very rarely.  Any medication can cause a severe allergic reaction. Such reactions from a vaccine are very rare, estimated at fewer than 1 in a million doses, and would happen within a few minutes to a few hours after the vaccination. As with any medicine, there is a very remote chance of a vaccine  causing a serious injury or death. The safety of vaccines is always being monitored. For more information, visit: www.cdc.gov/vaccinesafety/ 5. What if there is a serious problem? What should I look for? Look for anything that concerns you, such as signs of a severe allergic reaction, very high fever, or unusual behavior. Signs of a severe allergic reaction can include hives, swelling of the face and throat, difficulty breathing, a fast heartbeat, dizziness, and weakness. These would usually start a few minutes to a few hours after the vaccination. What should I do?  If you think it is a severe allergic reaction or other emergency that can't wait, call 9-1-1 or get the person to the nearest hospital. Otherwise, call your doctor.  Afterward, the reaction should be reported to the Vaccine Adverse Event Reporting System (VAERS). Your doctor might file this report, or you can do it yourself through the VAERS web site at www.vaers.hhs.gov, or by calling 1-800-822-7967. ? VAERS does not give medical advice. 6. The National Vaccine Injury Compensation Program The National   Vaccine Injury Compensation Program (VICP) is a federal program that was created to compensate people who may have been injured by certain vaccines. Persons who believe they may have been injured by a vaccine can learn about the program and about filing a claim by calling 1-800-338-2382 or visiting the VICP website at www.hrsa.gov/vaccinecompensation. There is a time limit to file a claim for compensation. 7. How can I learn more?  Ask your doctor. He or she can give you the vaccine package insert or suggest other sources of information.  Call your local or state health department.  Contact the Centers for Disease Control and Prevention (CDC): ? Call 1-800-232-4636 (1-800-CDC-INFO) or ? Visit CDC's website at www.cdc.gov/vaccines CDC Tdap Vaccine VIS (08/22/13) This information is not intended to replace advice given to you by your  health care provider. Make sure you discuss any questions you have with your health care provider. Document Released: 12/15/2011 Document Revised: 03/05/2016 Document Reviewed: 03/05/2016 Elsevier Interactive Patient Education  2017 Elsevier Inc.  Braxton Hicks Contractions Contractions of the uterus can occur throughout pregnancy, but they are not always a sign that you are in labor. You may have practice contractions called Braxton Hicks contractions. These false labor contractions are sometimes confused with true labor. What are Braxton Hicks contractions? Braxton Hicks contractions are tightening movements that occur in the muscles of the uterus before labor. Unlike true labor contractions, these contractions do not result in opening (dilation) and thinning of the cervix. Toward the end of pregnancy (32-34 weeks), Braxton Hicks contractions can happen more often and may become stronger. These contractions are sometimes difficult to tell apart from true labor because they can be very uncomfortable. You should not feel embarrassed if you go to the hospital with false labor. Sometimes, the only way to tell if you are in true labor is for your health care provider to look for changes in the cervix. The health care provider will do a physical exam and may monitor your contractions. If you are not in true labor, the exam should show that your cervix is not dilating and your water has not broken. If there are no prenatal problems or other health problems associated with your pregnancy, it is completely safe for you to be sent home with false labor. You may continue to have Braxton Hicks contractions until you go into true labor. How can I tell the difference between true labor and false labor?  Differences ? False labor ? Contractions last 30-70 seconds.: Contractions are usually shorter and not as strong as true labor contractions. ? Contractions become very regular.: Contractions are usually  irregular. ? Discomfort is usually felt in the top of the uterus, and it spreads to the lower abdomen and low back.: Contractions are often felt in the front of the lower abdomen and in the groin. ? Contractions do not go away with walking.: Contractions may go away when you walk around or change positions while lying down. ? Contractions usually become more intense and increase in frequency.: Contractions get weaker and are shorter-lasting as time goes on. ? The cervix dilates and gets thinner.: The cervix usually does not dilate or become thin. Follow these instructions at home:  Take over-the-counter and prescription medicines only as told by your health care provider.  Keep up with your usual exercises and follow other instructions from your health care provider.  Eat and drink lightly if you think you are going into labor.  If Braxton Hicks contractions are making you uncomfortable: ?   Change your position from lying down or resting to walking, or change from walking to resting. ? Sit and rest in a tub of warm water. ? Drink enough fluid to keep your urine clear or pale yellow. Dehydration may cause these contractions. ? Do slow and deep breathing several times an hour.  Keep all follow-up prenatal visits as told by your health care provider. This is important. Contact a health care provider if:  You have a fever.  You have continuous pain in your abdomen. Get help right away if:  Your contractions become stronger, more regular, and closer together.  You have fluid leaking or gushing from your vagina.  You pass blood-tinged mucus (bloody show).  You have bleeding from your vagina.  You have low back pain that you never had before.  You feel your baby's head pushing down and causing pelvic pressure.  Your baby is not moving inside you as much as it used to. Summary  Contractions that occur before labor are called Braxton Hicks contractions, false labor, or practice  contractions.  Braxton Hicks contractions are usually shorter, weaker, farther apart, and less regular than true labor contractions. True labor contractions usually become progressively stronger and regular and they become more frequent.  Manage discomfort from Braxton Hicks contractions by changing position, resting in a warm bath, drinking plenty of water, or practicing deep breathing. This information is not intended to replace advice given to you by your health care provider. Make sure you discuss any questions you have with your health care provider. Document Released: 06/15/2005 Document Revised: 05/04/2016 Document Reviewed: 05/04/2016 Elsevier Interactive Patient Education  2017 Elsevier Inc.  

## 2016-09-28 NOTE — Progress Notes (Signed)
[facility]OB-[facility] well [facility] Reviewed US findings. Anatomy WNL, oligohydramnios. Encouraged twice daily kick counts, 80-100 ounces of water daily, and no sodas or smoking. Reviewed red flag symptoms and when to call. RTC x 2 weeks for AFI and ROB.    ULTRASOUND REPORT  Location: [facility] Date of Service: 09/28/16  Indications: F/U Anatomy Findings:  Singleton intrauterine pregnancy is visualized with FHR at 115 BPM.   Fetal presentation is cephalic, spine posterior.  Placenta: Posterior, grade  2. AFI: OLIGOHYDRAMNIOS at 7.2 cm. (This is at the 2.5 percentile for [redacted] weeks gestation. 50th percentile for 34 weeks is 14.2 cm).  Anatomic survey is now complete with the acquisition of the fetal posterior, fossa, cerebellum, cisterna magna and face. The fetal kidneys, bladder and stomach were also surveyed and all appear WNL. Gender - Female.     Impression: 1. Anatomy scan is now complete and appears WNL. 2. Oligohydramnios at 7.2 cm.   Scan reviewed and agree with findings.    Gunnar Bulla, CNM

## 2016-09-29 ENCOUNTER — Other Ambulatory Visit: Payer: Self-pay | Admitting: Certified Nurse Midwife

## 2016-09-29 LAB — HEMOGLOBIN AND HEMATOCRIT, BLOOD
HEMOGLOBIN: 10.4 g/dL — AB (ref 11.1–15.9)
Hematocrit: 31.1 % — ABNORMAL LOW (ref 34.0–46.6)

## 2016-09-29 LAB — GLUCOSE, 1 HOUR GESTATIONAL: GESTATIONAL DIABETES SCREEN: 84 mg/dL (ref 65–139)

## 2016-09-29 MED ORDER — CITRANATAL BLOOM 90-1 MG PO TABS: 1.0000 | ORAL_TABLET | Freq: Every day | ORAL | 5 refills | Status: DC

## 2016-10-05 ENCOUNTER — Emergency Department (HOSPITAL_COMMUNITY): Payer: PRIVATE HEALTH INSURANCE

## 2016-10-05 ENCOUNTER — Encounter (HOSPITAL_COMMUNITY): Payer: Self-pay | Admitting: Emergency Medicine

## 2016-10-05 ENCOUNTER — Emergency Department (HOSPITAL_COMMUNITY)
Admission: EM | Admit: 2016-10-05 | Discharge: 2016-10-05 | Disposition: A | Discharge status: Home / Self Care | Payer: PRIVATE HEALTH INSURANCE | Attending: Emergency Medicine | Admitting: Emergency Medicine

## 2016-10-05 DIAGNOSIS — J45909 Unspecified asthma, uncomplicated: Secondary | ICD-10-CM | POA: Insufficient documentation

## 2016-10-05 DIAGNOSIS — Z3A35 35 weeks gestation of pregnancy: Secondary | ICD-10-CM | POA: Insufficient documentation

## 2016-10-05 DIAGNOSIS — W19XXXA Unspecified fall, initial encounter: Secondary | ICD-10-CM

## 2016-10-05 DIAGNOSIS — N189 Chronic kidney disease, unspecified: Secondary | ICD-10-CM | POA: Insufficient documentation

## 2016-10-05 DIAGNOSIS — Y999 Unspecified external cause status: Secondary | ICD-10-CM | POA: Diagnosis not present

## 2016-10-05 DIAGNOSIS — O9A213 Injury, poisoning and certain other consequences of external causes complicating pregnancy, third trimester: Secondary | ICD-10-CM | POA: Insufficient documentation

## 2016-10-05 DIAGNOSIS — R52 Pain, unspecified: Secondary | ICD-10-CM

## 2016-10-05 DIAGNOSIS — Y939 Activity, unspecified: Secondary | ICD-10-CM | POA: Diagnosis not present

## 2016-10-05 DIAGNOSIS — Z91010 Allergy to peanuts: Secondary | ICD-10-CM | POA: Diagnosis not present

## 2016-10-05 DIAGNOSIS — W010XXA Fall on same level from slipping, tripping and stumbling without subsequent striking against object, initial encounter: Secondary | ICD-10-CM | POA: Diagnosis not present

## 2016-10-05 DIAGNOSIS — O99333 Smoking (tobacco) complicating pregnancy, third trimester: Secondary | ICD-10-CM | POA: Diagnosis not present

## 2016-10-05 DIAGNOSIS — Y929 Unspecified place or not applicable: Secondary | ICD-10-CM | POA: Diagnosis not present

## 2016-10-05 DIAGNOSIS — Z79899 Other long term (current) drug therapy: Secondary | ICD-10-CM | POA: Diagnosis not present

## 2016-10-05 DIAGNOSIS — F17210 Nicotine dependence, cigarettes, uncomplicated: Secondary | ICD-10-CM | POA: Diagnosis not present

## 2016-10-05 DIAGNOSIS — S82821A Torus fracture of lower end of right fibula, initial encounter for closed fracture: Secondary | ICD-10-CM

## 2016-10-05 MED ORDER — ACETAMINOPHEN 325 MG PO TABS
650.0000 mg | ORAL_TABLET | Freq: Once | ORAL | Status: AC
  Administered 2016-10-05: 650 mg via ORAL
  Filled 2016-10-05: qty 2

## 2016-10-05 MED ORDER — OXYCODONE HCL 5 MG PO TABS
5.0000 mg | ORAL_TABLET | Freq: Once | ORAL | Status: AC
  Administered 2016-10-05: 5 mg via ORAL
  Filled 2016-10-05: qty 1

## 2016-10-05 MED ORDER — HYDROCODONE-ACETAMINOPHEN 5-325 MG PO TABS: 1.0000 | ORAL_TABLET | ORAL | 0 refills | Status: DC | PRN

## 2016-10-05 NOTE — ED Notes (Signed)
ED Provider at bedside. 

## 2016-10-05 NOTE — Progress Notes (Signed)
Orthopedic Tech Progress Note Patient Details:  Robyn Williams 08/14/1985 [id number]  Ortho Devices Type of Ortho Device: Ace wrap, Post (short leg) splint, Stirrup splint Ortho Device/Splint Location: RLE Ortho Device/Splint Interventions: Ordered, Application   Jennye Moccasin 10/05/2016, 8:39 PM

## 2016-10-05 NOTE — Care Management (Signed)
ED CM met with patient to discuss DME, patient [redacted] weeks pregnant s/p fall and unable to weight bear. Patient will benefit from stand wheel chair.  Discussed recommendations with patient she is  Agreeable.CM spoke with [facility] NP in [place] w/c ordered and referral faxed in to [facility] as per patient's request. CM will follow up in the am  with referral.

## 2016-10-05 NOTE — ED Notes (Signed)
Triage completed in error under [facility] by [facility]. All information is correct.

## 2016-10-05 NOTE — Progress Notes (Signed)
Orthopedic Tech Progress Note Patient Details:  Robyn Williams [birth year] [id number]  Ortho Devices Type of Ortho Device: Crutches Ortho Device/Splint Location: RLE Ortho Device/Splint Interventions: Ordered, Adjustment   Jennye Moccasin 10/05/2016, 10:19 PM

## 2016-10-05 NOTE — ED Notes (Signed)
Pt hooked to fetal heart monitor. HR 145

## 2016-10-05 NOTE — Progress Notes (Signed)
1848 Arrived to evaluate this 31 yo G6P4 @ 34.[redacted] wks GA with report of fall.  Ankle injury. Pt denies striking abdomen, UCs, bleeding or LOF.  Reports good fetal movement.  FHR Category I, 1 UC slight UI.  1936 Report to Dr. Debroah Loop.  He states pt can be OB cleared.

## 2016-10-05 NOTE — ED Notes (Signed)
Pt. placed on fetal monitor.

## 2016-10-05 NOTE — ED Notes (Signed)
Case management made aware of need to speak with pt.

## 2016-10-05 NOTE — ED Triage Notes (Signed)
Pt states she tripped over a toy in her child's bedroom and rolled ankle. Right ankle is swollen. Pt [redacted] weeks pregnant. Pt unsure if she hit her belly. No LOC.

## 2016-10-05 NOTE — ED Notes (Signed)
XR at bedside

## 2016-10-05 NOTE — ED Notes (Signed)
Ortho paged. 

## 2016-10-05 NOTE — ED Provider Notes (Signed)
[facility]-EMERGENCY DEPT Provider Note   CSN: [id number] Arrival date & time: 10/05/16  1755   By signing my name below, I, Clovis Pu, attest that this documentation has been prepared under the direction and in the presence of  Kerrie Buffalo, NP. Electronically Signed: Clovis Pu, ED Scribe. 10/05/16. 6:38 PM.   History   Chief Complaint Chief Complaint  Patient presents with  . Ankle Injury    HPI Comments:  Robyn Williams is a 31 y.o.  [id number] @ [redacted] weeks gestation who presents to the Emergency Department complaining of acute onset, moderate right ankle pain s/p an incident which occurred PTA. Pt states she tripped over a toy in her daughter's room, fell and hurt her ankle. Her pain is worse with palpation. She reports associated joint swelling. No alleviating factors noted. Pt denies hitting her abdomen when she fell. Pt also denies dental injury, head injury, LOC or any other associated symptoms. She has taken nothing for pain. She notes at her last appointment she was told she had low fluid. Her next appointment is on 10/12/16. No other complaints noted at this time.  . The history is provided by the patient. No language interpreter was used.  Ankle Injury  This is a new problem. The current episode started 1 to 2 hours ago. The problem has not changed since onset.Pertinent negatives include no abdominal pain and no headaches. Exacerbated by: palpation. Nothing relieves the symptoms. She has tried nothing for the symptoms. The treatment provided no relief.    Past Medical History:  Diagnosis Date  . Anemia   . Asthma    with pregnancy  . Chronic kidney disease    kidney stones  . Depression    doing good  . Urinary tract infection     There are no active problems to display for this patient.   Past Surgical History:  Procedure Laterality Date  . NO PAST SURGERIES      OB History    Gravida Para Term Preterm AB Living   0 1 4   SAB TAB Ectopic Multiple Live Births    1 0 0 0 4       Home Medications    Prior to Admission medications   Medication Sig Start Date End Date Taking? Authorizing Provider  HYDROcodone-acetaminophen (NORCO) 5-325 MG tablet Take 1 tablet by mouth every 4 (four) hours as needed. 10/05/16   Cheryle Dark Orlene Och, NP  metroNIDAZOLE (FLAGYL) 500 MG tablet Take 500 mg by mouth 3 (three) times daily.    Historical Provider, MD  Prenat-FeAsp-Meth-FA-DHA w/o A (PRENATE PIXIE) 10-0.6-0.4-200 MG CAPS Take 10 mg by mouth daily. 09/14/16   Gunnar Bulla, CNM  Prenatal-DSS-FeCb-FeGl-FA (CITRANATAL BLOOM) 90-1 MG TABS Take 1 tablet by mouth daily. 09/29/16   Gunnar Bulla, CNM    Family History Family History  Problem Relation Age of Onset  . Hypertension Mother   . Cancer Paternal Grandmother   . Anesthesia problems Neg Hx   . Hearing loss Neg Hx   . Asthma Neg Hx   . Diabetes Neg Hx     Social History Social History  Substance Use Topics  . Smoking status: Current Some Day Smoker    Years: 0.50    Types: Cigarettes  . Smokeless tobacco: Never Used  . Alcohol use Yes     Comment: Sparingly      Allergies   Amoxicillin; Peanut-containing drug products; Phenergan [promethazine hcl]; Zofran [ondansetron hcl]; and Latex  Review of Systems Review of Systems  HENT: Negative for dental problem.   Gastrointestinal: Negative for abdominal pain and nausea.  Genitourinary:       Pregnant  Musculoskeletal: Positive for joint swelling and myalgias. Negative for neck pain.  Skin: Negative for wound.  Neurological: Negative for syncope, numbness and headaches.     Physical Exam Updated Vital Signs BP 107/66 (BP Location: Right Arm)   Pulse (!) 105   Temp 98.1 F (36.7 C) (Oral)   Resp 18   LMP 03/16/2016 (Within Months)   SpO2 100%   Physical Exam  Constitutional: She is oriented to person, place, and time. She appears well-developed and well-nourished. No distress.  Pt is on fetal monitor   HENT:  Head:  Normocephalic and atraumatic.  Eyes: Conjunctivae are normal.  Neck: Normal range of motion. Neck supple.  Cardiovascular: Normal rate and regular rhythm.   Pulmonary/Chest: Effort normal.  Abdominal:  Gravid consistent with dates. Positive FHT's  Musculoskeletal: She exhibits edema and tenderness.       Right ankle: She exhibits decreased range of motion and swelling. She exhibits no laceration and normal pulse. Tenderness. Lateral malleolus tenderness found. Achilles tendon normal.  Swelling and tenderness to the lateral aspect of the right ankle. Adequate circulation pedal pulses 2+  Neurological: She is alert and oriented to person, place, and time.  Skin: Skin is warm and dry.  Psychiatric: She has a normal mood and affect. Her behavior is normal.  Nursing note and vitals reviewed.    ED Treatments / Results  DIAGNOSTIC STUDIES:  Oxygen Saturation is 100% on RA, normal by my interpretation.    COORDINATION OF CARE:  6:33 PM Discussed treatment plan with pt at bedside and pt agreed to plan.  Rapid response OB RN in to evaluate the patient and consult with Dr. Debroah Loop, OB on call at [facility].   Labs (all labs ordered are listed, but only abnormal results are displayed) Labs Reviewed - No data to display  Radiology Dg Ankle Right Port  Result Date: 10/05/2016 CLINICAL DATA:  Tripped and fell with ankle injury. EXAM: PORTABLE RIGHT ANKLE - 2 VIEW COMPARISON:  None. FINDINGS: Short oblique fracture noted in the distal fibula, at approximately the level of the ankle mortise. Mild widening of the lateral tibiotalar space noted. No gross subluxation or dislocation. Soft tissue swelling is noted anteriorly and laterally. IMPRESSION: Short oblique fracture of the distal fibula extends to the level of the ankle mortise. Mild widening of the lateral tibiotalar space. Electronically Signed   By: Kennith Center M.D.   On: 10/05/2016 19:18    Procedures Procedures (including critical care  time)  Medications Ordered in ED Medications  acetaminophen (TYLENOL) tablet 650 mg (650 mg Oral Given 10/05/16 1844)  oxyCODONE (Oxy IR/ROXICODONE) immediate release tablet 5 mg (5 mg Oral Given 10/05/16 1958)     Initial Impression / Assessment and Plan / ED Course  I have reviewed the triage vital signs and the nursing notes.  Pertinent imaging results that were available during my care of the patient were reviewed by me and considered in my medical decision making (see chart for details).    6:36 PM Pt is currently on fetal monitor. Rapid response has been notified.   7:40 PM Discussed pain management with Dr. Debroah Loop who recommended percocet.   Patient X-Ray positive for obvious fracture. Pt advised to follow up with orthopedics. Patient given splint and crutches while in ED order for wheel chair, conservative  therapy recommended and discussed. Patient will be discharged home with pain medication and ortho referral & is agreeable with above plan. Returns precautions discussed. Pt appears safe for discharge.  Final Clinical Impressions(s) / ED Diagnoses   Final diagnoses:  Closed torus fracture of distal end of right fibula, initial encounter  Fall, initial encounter    New Prescriptions Discharge Medication List as of 10/05/2016  8:01 PM    START taking these medications   Details  HYDROcodone-acetaminophen (NORCO) 5-325 MG tablet Take 1 tablet by mouth every 4 (four) hours as needed., Starting Mon 10/05/2016, Print      I personally performed the services described in this documentation, which was scribed in my presence. The recorded information has been reviewed and is accurate.     [address] [place], [place] 10/06/16 1610    Margarita Grizzle, MD 10/07/16 [phone]

## 2016-10-07 NOTE — Care Management (Addendum)
10/06/16 16:35 ED CM contacted patient to  followed up with delivery of w/c. Patient confirmed has received w/c.

## 2016-10-08 ENCOUNTER — Encounter: Payer: Self-pay | Admitting: Certified Nurse Midwife

## 2016-10-09 ENCOUNTER — Telehealth: Payer: Self-pay | Admitting: Certified Nurse Midwife

## 2016-10-09 ENCOUNTER — Other Ambulatory Visit: Payer: Self-pay | Admitting: Certified Nurse Midwife

## 2016-10-09 DIAGNOSIS — O288 Other abnormal findings on antenatal screening of mother: Secondary | ICD-10-CM

## 2016-10-09 NOTE — Telephone Encounter (Signed)
Patient wants to speak with you regarding a script given to her for pain in the hospital.

## 2016-10-11 ENCOUNTER — Inpatient Hospital Stay (HOSPITAL_COMMUNITY)
Admission: AD | Admit: 2016-10-11 | Discharge: 2016-10-11 | Disposition: A | Discharge status: Home / Self Care | Payer: PRIVATE HEALTH INSURANCE | Source: Ambulatory Visit | Attending: Family Medicine | Admitting: Family Medicine

## 2016-10-11 ENCOUNTER — Encounter (HOSPITAL_COMMUNITY): Payer: Self-pay | Admitting: Certified Nurse Midwife

## 2016-10-11 DIAGNOSIS — Z3A35 35 weeks gestation of pregnancy: Secondary | ICD-10-CM | POA: Insufficient documentation

## 2016-10-11 DIAGNOSIS — O4703 False labor before 37 completed weeks of gestation, third trimester: Secondary | ICD-10-CM | POA: Diagnosis not present

## 2016-10-11 DIAGNOSIS — O479 False labor, unspecified: Secondary | ICD-10-CM

## 2016-10-11 DIAGNOSIS — Z0371 Encounter for suspected problem with amniotic cavity and membrane ruled out: Secondary | ICD-10-CM | POA: Diagnosis not present

## 2016-10-11 LAB — AMNISURE RUPTURE OF MEMBRANE (ROM) NOT AT ARMC: Amnisure ROM: NEGATIVE

## 2016-10-11 LAB — POCT FERN TEST: POCT Fern Test: NEGATIVE

## 2016-10-11 MED ORDER — HYDROCODONE-ACETAMINOPHEN 5-325 MG PO TABS
2.0000 | ORAL_TABLET | Freq: Once | ORAL | Status: AC
  Administered 2016-10-11: 2 via ORAL
  Filled 2016-10-11: qty 2

## 2016-10-11 NOTE — MAU Note (Signed)
Ctxs since 2400. Leaked some fld at 2030. Has leaked a couple more times when had to hop and pivot into and out of w/c. Pt has broken leg from fall.

## 2016-10-11 NOTE — Discharge Instructions (Signed)

## 2016-10-11 NOTE — MAU Provider Note (Signed)
S:  Robyn Williams is a 31 y.o. female [phone] @ [redacted]w[redacted]d here in MAU with contractions. She had one episode of leaking fluid when she was changing positions. She is not actively leaking fluid at this time.    O:  GENERAL: Well-developed, well-nourished female in no acute distress.  LUNGS: Effort normal SKIN: Warm, dry and without erythema PSYCH: Normal mood and affect  Vitals:   10/11/16 0440  BP: 107/69  Pulse: (!) 104  Resp: 16  Temp: 98 F (36.7 C)    MDM:  Cervix 1 Cm and thick per RN Amnisure negative.  2 Vicodin PO given for pain per patient requests.    A:  1. Encounter for suspected premature rupture of amniotic membranes, with rupture of membranes not found   2. False labor     P:  Discharge home in stable condition Strict return precautions  Preterm labor precautions    Duane Lope, NP 10/12/2016 8:30 AM

## 2016-10-12 ENCOUNTER — Ambulatory Visit (INDEPENDENT_AMBULATORY_CARE_PROVIDER_SITE_OTHER): Payer: Medicaid Other

## 2016-10-12 ENCOUNTER — Ambulatory Visit (INDEPENDENT_AMBULATORY_CARE_PROVIDER_SITE_OTHER): Payer: Medicaid Other | Admitting: Certified Nurse Midwife

## 2016-10-12 ENCOUNTER — Other Ambulatory Visit: Payer: Medicaid Other

## 2016-10-12 ENCOUNTER — Encounter: Payer: Medicaid Other | Admitting: Certified Nurse Midwife

## 2016-10-12 ENCOUNTER — Other Ambulatory Visit: Payer: Self-pay | Admitting: Certified Nurse Midwife

## 2016-10-12 ENCOUNTER — Other Ambulatory Visit: Payer: Self-pay

## 2016-10-12 DIAGNOSIS — O4103X0 Oligohydramnios, third trimester, not applicable or unspecified: Secondary | ICD-10-CM

## 2016-10-12 DIAGNOSIS — Z3685 Encounter for antenatal screening for Streptococcus B: Secondary | ICD-10-CM

## 2016-10-12 DIAGNOSIS — O288 Other abnormal findings on antenatal screening of mother: Secondary | ICD-10-CM

## 2016-10-12 DIAGNOSIS — Z113 Encounter for screening for infections with a predominantly sexual mode of transmission: Secondary | ICD-10-CM

## 2016-10-12 DIAGNOSIS — O0933 Supervision of pregnancy with insufficient antenatal care, third trimester: Secondary | ICD-10-CM

## 2016-10-12 DIAGNOSIS — Z3493 Encounter for supervision of normal pregnancy, unspecified, third trimester: Secondary | ICD-10-CM

## 2016-10-12 LAB — POCT URINALYSIS DIPSTICK
Glucose, UA: NEGATIVE
Ketones, UA: NEGATIVE
Nitrite, UA: NEGATIVE
PH UA: 7 (ref 5.0–8.0)
Spec Grav, UA: 1.015 (ref 1.010–1.025)
UROBILINOGEN UA: 0.2 U/dL

## 2016-10-12 NOTE — Progress Notes (Signed)
ROB- pt has broke her fibia 10/05/16, pt went to [facility] Saturday, thought she was leaking fluid, cultures obtained

## 2016-10-12 NOTE — Progress Notes (Signed)
ROB-Seen in ED 04/09 for broken fibia. F/u appointment scheduled Tuesday with orthopedics. Advised pt to contact ortho for pain management due to broken fibia.  Seen 04/15 at [facility] and PPROM ruled out. AFI 7.5, largest single pocket greater than 2 cm, and fetal breathing movements present. Encourage maternal hydration and twice daily kick counts. 36 week cultures collected today. RTC x 1 week for BPP and ROB.   ULTRASOUND REPORT  Location: [facility] Date of Service: 10/12/16  Indications: AFI for Oligohydramnios Findings:  Singleton intrauterine pregnancy is visualized with FHR at 144 BPM.   Fetal presentation is vertex, spine anterior.  Placenta: Posterior, grade 2. AFI: Oligo at 7.5 cm which is just below the 5th percentile for fluid volume for 36 weeks.  Anatomic survey of the fetal stomach, bladder and kidneys appears WNL. Gender: Female    Impression: 1. Oligohydramnios at [redacted] weeks gestation. AFI is 7.5 cm.  Recommendations: 1.Clinical correlation with the patient's History and Physical Exam. 2. Patient will return in 1 week for repeat AFI

## 2016-10-13 ENCOUNTER — Other Ambulatory Visit: Payer: Self-pay | Admitting: Certified Nurse Midwife

## 2016-10-15 ENCOUNTER — Encounter: Payer: Self-pay | Admitting: Certified Nurse Midwife

## 2016-10-15 LAB — STREP GP B NAA+RFLX: Strep Gp B NAA+Rflx: NEGATIVE

## 2016-10-18 ENCOUNTER — Inpatient Hospital Stay (HOSPITAL_COMMUNITY)
Admission: AD | Admit: 2016-10-18 | Discharge: 2016-10-18 | Disposition: A | Discharge status: Home / Self Care | Payer: PRIVATE HEALTH INSURANCE | Source: Ambulatory Visit | Attending: Obstetrics and Gynecology | Admitting: Obstetrics and Gynecology

## 2016-10-18 DIAGNOSIS — O26899 Other specified pregnancy related conditions, unspecified trimester: Secondary | ICD-10-CM

## 2016-10-18 DIAGNOSIS — O42913 Preterm premature rupture of membranes, unspecified as to length of time between rupture and onset of labor, third trimester: Secondary | ICD-10-CM | POA: Diagnosis present

## 2016-10-18 DIAGNOSIS — N898 Other specified noninflammatory disorders of vagina: Secondary | ICD-10-CM

## 2016-10-18 DIAGNOSIS — Z3483 Encounter for supervision of other normal pregnancy, third trimester: Secondary | ICD-10-CM | POA: Diagnosis not present

## 2016-10-18 DIAGNOSIS — O9989 Other specified diseases and conditions complicating pregnancy, childbirth and the puerperium: Secondary | ICD-10-CM

## 2016-10-18 LAB — POCT FERN TEST: POCT Fern Test: NEGATIVE

## 2016-10-18 NOTE — MAU Note (Signed)
At 1 am had some leaking.  White in color but watery.  No bleeding. Baby not moving as much tonight. Crampy abd pain on and off all day.

## 2016-10-18 NOTE — Discharge Instructions (Signed)
Fetal Movement Counts Patient Name: ________________________________________________ Patient Due Date: ____________________ What is a fetal movement count? A fetal movement count is the number of times that you feel your baby move during a certain amount of time. This may also be called a fetal kick count. A fetal movement count is recommended for every pregnant woman. You may be asked to start counting fetal movements as early as week 28 of your pregnancy. Pay attention to when your baby is most active. You may notice your baby's sleep and wake cycles. You may also notice things that make your baby move more. You should do a fetal movement count:  When your baby is normally most active.  At the same time each day. A good time to count movements is while you are resting, after having something to eat and drink. How do I count fetal movements? 1. Find a quiet, comfortable area. Sit, or lie down on your side. 2. Write down the date, the start time and stop time, and the number of movements that you felt between those two times. Take this information with you to your health care visits. 3. For 2 hours, count kicks, flutters, swishes, rolls, and jabs. You should feel at least 10 movements during 2 hours. 4. You may stop counting after you have felt 10 movements. 5. If you do not feel 10 movements in 2 hours, have something to eat and drink. Then, keep resting and counting for 1 hour. If you feel at least 4 movements during that hour, you may stop counting. Contact a health care provider if:  You feel fewer than 4 movements in 2 hours.  Your baby is not moving like he or she usually does. Date: ____________ Start time: ____________ Stop time: ____________ Movements: ____________ Date: ____________ Start time: ____________ Stop time: ____________ Movements: ____________ Date: ____________ Start time: ____________ Stop time: ____________ Movements: ____________ Date: ____________ Start time:  ____________ Stop time: ____________ Movements: ____________ Date: ____________ Start time: ____________ Stop time: ____________ Movements: ____________ Date: ____________ Start time: ____________ Stop time: ____________ Movements: ____________ Date: ____________ Start time: ____________ Stop time: ____________ Movements: ____________ Date: ____________ Start time: ____________ Stop time: ____________ Movements: ____________ Date: ____________ Start time: ____________ Stop time: ____________ Movements: ____________ This information is not intended to replace advice given to you by your health care provider. Make sure you discuss any questions you have with your health care provider. Document Released: 07/15/2006 Document Revised: 02/12/2016 Document Reviewed: 07/25/2015 Elsevier Interactive Patient Education  2017 [employer]. Third Trimester of Pregnancy The third trimester is from week 29 through week 42, months 7 through 9. This trimester is when your unborn baby (fetus) is growing very fast. At the end of the ninth month, the unborn baby is about 20 inches in length. It weighs about 6-10 pounds. Follow these instructions at home:  Avoid all smoking, herbs, and alcohol. Avoid drugs not approved by your doctor.  Do not use any tobacco products, including cigarettes, chewing tobacco, and electronic cigarettes. If you need help quitting, ask your doctor. You may get counseling or other support to help you quit.  Only take medicine as told by your doctor. Some medicines are safe and some are not during pregnancy.  Exercise only as told by your doctor. Stop exercising if you start having cramps.  Eat regular, healthy meals.  Wear a good support bra if your breasts are tender.  Do not use hot tubs, steam rooms, or saunas.  Wear your seat belt when driving.  Avoid raw meat, uncooked cheese, and liter boxes and soil used by cats.  Take your prenatal vitamins.  Take 1500-2000 milligrams  of calcium daily starting at the 20th week of pregnancy until you deliver your baby.  Try taking medicine that helps you poop (stool softener) as needed, and if your doctor approves. Eat more fiber by eating fresh fruit, vegetables, and whole grains. Drink enough fluids to keep your pee (urine) clear or pale yellow.  Take warm water baths (sitz baths) to soothe pain or discomfort caused by hemorrhoids. Use hemorrhoid cream if your doctor approves.  If you have puffy, bulging veins (varicose veins), wear support hose. Raise (elevate) your feet for 15 minutes, 3-4 times a day. Limit salt in your diet.  Avoid heavy lifting, wear low heels, and sit up straight.  Rest with your legs raised if you have leg cramps or low back pain.  Visit your dentist if you have not gone during your pregnancy. Use a soft toothbrush to brush your teeth. Be gentle when you floss.  You can have sex (intercourse) unless your doctor tells you not to.  Do not travel far distances unless you must. Only do so with your doctor's approval.  Take prenatal classes.  Practice driving to the hospital.  Pack your hospital bag.  Prepare the baby's room.  Go to your doctor visits. Get help if:  You are not sure if you are in labor or if your water has broken.  You are dizzy.  You have mild cramps or pressure in your lower belly (abdominal).  You have a nagging pain in your belly area.  You continue to feel sick to your stomach (nauseous), throw up (vomit), or have watery poop (diarrhea).  You have bad smelling fluid coming from your vagina.  You have pain with peeing (urination). Get help right away if:  You have a fever.  You are leaking fluid from your vagina.  You are spotting or bleeding from your vagina.  You have severe belly cramping or pain.  You lose or gain weight rapidly.  You have trouble catching your breath and have chest pain.  You notice sudden or extreme puffiness (swelling) of your  face, hands, ankles, feet, or legs.  You have not felt the baby move in over an hour.  You have severe headaches that do not go away with medicine.  You have vision changes. This information is not intended to replace advice given to you by your health care provider. Make sure you discuss any questions you have with your health care provider. Document Released: 09/09/2009 Document Revised: 11/21/2015 Document Reviewed: 08/16/2012 Elsevier Interactive Patient Education  2017 [employer] of the uterus can occur throughout pregnancy, but they are not always a sign that you are in labor. You may have practice contractions called Braxton Hicks contractions. These false labor contractions are sometimes confused with true labor. What are Deberah Pelton contractions? Braxton Hicks contractions are tightening movements that occur in the muscles of the uterus before labor. Unlike true labor contractions, these contractions do not result in opening (dilation) and thinning of the cervix. Toward the end of pregnancy (32-34 weeks), Braxton Hicks contractions can happen more often and may become stronger. These contractions are sometimes difficult to tell apart from true labor because they can be very uncomfortable. You should not feel embarrassed if you go to the hospital with false labor. Sometimes, the only way to tell if you are in true labor  is for your health care provider to look for changes in the cervix. The health care provider will do a physical exam and may monitor your contractions. If you are not in true labor, the exam should show that your cervix is not dilating and your water has not broken. If there are no prenatal problems or other health problems associated with your pregnancy, it is completely safe for you to be sent home with false labor. You may continue to have Braxton Hicks contractions until you go into true labor. How can I tell the difference  between true labor and false labor?  Differences  False labor  Contractions last 30-70 seconds.: Contractions are usually shorter and not as strong as true labor contractions.  Contractions become very regular.: Contractions are usually irregular.  Discomfort is usually felt in the top of the uterus, and it spreads to the lower abdomen and low back.: Contractions are often felt in the front of the lower abdomen and in the groin.  Contractions do not go away with walking.: Contractions may go away when you walk around or change positions while lying down.  Contractions usually become more intense and increase in frequency.: Contractions get weaker and are shorter-lasting as time goes on.  The cervix dilates and gets thinner.: The cervix usually does not dilate or become thin. Follow these instructions at home:  Take over-the-counter and prescription medicines only as told by your health care provider.  Keep up with your usual exercises and follow other instructions from your health care provider.  Eat and drink lightly if you think you are going into labor.  If Braxton Hicks contractions are making you uncomfortable:  Change your position from lying down or resting to walking, or change from walking to resting.  Sit and rest in a tub of warm water.  Drink enough fluid to keep your urine clear or pale yellow. Dehydration may cause these contractions.  Do slow and deep breathing several times an hour.  Keep all follow-up prenatal visits as told by your health care provider. This is important. Contact a health care provider if:  You have a fever.  You have continuous pain in your abdomen. Get help right away if:  Your contractions become stronger, more regular, and closer together.  You have fluid leaking or gushing from your vagina.  You pass blood-tinged mucus (bloody show).  You have bleeding from your vagina.  You have low back pain that you never had before.  You  feel your babys head pushing down and causing pelvic pressure.  Your baby is not moving inside you as much as it used to. Summary  Contractions that occur before labor are called Braxton Hicks contractions, false labor, or practice contractions.  Braxton Hicks contractions are usually shorter, weaker, farther apart, and less regular than true labor contractions. True labor contractions usually become progressively stronger and regular and they become more frequent.  Manage discomfort from [facility] contractions by changing position, resting in a warm bath, drinking plenty of water, or practicing deep breathing. This information is not intended to replace advice given to you by your health care provider. Make sure you discuss any questions you have with your health care provider. Document Released: 06/15/2005 Document Revised: 05/04/2016 Document Reviewed: 05/04/2016 Elsevier Interactive Patient Education  2017 [employer].

## 2016-10-18 NOTE — MAU Provider Note (Signed)
S: Robyn Williams is a 31 y.o. [id number] at [redacted]w[redacted]d  who presents to MAU today complaining of leaking of fluid since 03:00, states her wheelchair was moist when she got up to use the bathroom twice, no gushes. She denies vaginal bleeding. She endorses occasional infrequent contractions. She reports normal fetal movement.    O: BP 104/64 (BP Location: Right Arm)   Pulse (!) 108   Temp 98.2 F (36.8 C) (Oral)   Resp 16   Ht  (1.651 m)   Wt 172 lb (78 kg)   LMP 03/16/2016 (Within Months)   SpO2 99%   BMI 28.62 kg/m  GENERAL: Well-developed, well-nourished female in no acute distress.  HEAD: Normocephalic, atraumatic.  CHEST: Normal effort of breathing, regular heart rate ABDOMEN: Soft, nontender, gravid PELVIC: Normal external female genitalia. Vagina is pink and rugated. Cervix with normal contour, no lesions. Normal discharge.  Negative pooling, just increased white creamy discharge.   Cervical exam:  Dilation: 1 Exam by:: Dr Omer Jack 1/thick/posterior/high  Fetal Monitoring: Baseline: 125 bpm Variability: moderate Accelerations: 15x15s Decelerations: None Contractions: Infrequent, irregular  Results for orders placed or performed during the hospital encounter of 10/18/16 (from the past 24 hour(s))  Fern Test     Status: None   Collection Time: 10/18/16  5:54 AM  Result Value Ref Range   POCT Fern Test Negative = intact amniotic membranes      A: SIUP at [redacted]w[redacted]d  Membranes intact  P: Preterm labor precautions   Hiram Comber Turley, DO  OB Fellow 10/18/2016 6:07 AM

## 2016-10-19 ENCOUNTER — Ambulatory Visit (INDEPENDENT_AMBULATORY_CARE_PROVIDER_SITE_OTHER): Payer: PRIVATE HEALTH INSURANCE

## 2016-10-19 ENCOUNTER — Encounter: Payer: Self-pay | Admitting: Certified Nurse Midwife

## 2016-10-19 ENCOUNTER — Ambulatory Visit (INDEPENDENT_AMBULATORY_CARE_PROVIDER_SITE_OTHER): Payer: PRIVATE HEALTH INSURANCE | Admitting: Certified Nurse Midwife

## 2016-10-19 VITALS — BP 100/62 | HR 106 | Wt 174.6 lb

## 2016-10-19 DIAGNOSIS — O4103X0 Oligohydramnios, third trimester, not applicable or unspecified: Secondary | ICD-10-CM | POA: Diagnosis not present

## 2016-10-19 DIAGNOSIS — Z3493 Encounter for supervision of normal pregnancy, unspecified, third trimester: Secondary | ICD-10-CM

## 2016-10-19 LAB — POCT URINALYSIS DIPSTICK
BILIRUBIN UA: NEGATIVE
GLUCOSE UA: NEGATIVE
Ketones, UA: NEGATIVE
Nitrite, UA: NEGATIVE
Protein, UA: NEGATIVE
RBC UA: NEGATIVE
SPEC GRAV UA: 1.025 (ref 1.010–1.025)
Urobilinogen, UA: >=8 E.U./dL — AB
pH, UA: 6.5 (ref 5.0–8.0)

## 2016-10-19 NOTE — Progress Notes (Signed)
ROB-Pt doing well. Seen at [facility] on 10/18/2016 for LOF and decreased fetal movement. Negative for PPROM, increased vaginal discharge of pregnancy. US findings reviewed with pt and spouse. AFI: 9.6, BPP: 8/8. Encouraged pt and spouse to seek pregnancy related care at [facility]. Reviewed red flag symptoms and when to call. RTC x 1 week for ROB.  ULTRASOUND REPORT  Location: [facility] Date of Service: 10/19/16  Indications:Fetal BPP WO non stress Findings:  Singleton intrauterine pregnancy is visualized with FHR at 137 BPM.  Fetal presentation is Vertex.  Placenta: Posterior, grade 1-2, remote to cervix. AFI: 9.6 cm (normal range).  Fetal BPP score is 8/8  Impression: 1. Fetal BPP Score 8/8  Recommendations: 1.Clinical correlation with the patient's History and Physical Exam.

## 2016-10-19 NOTE — Patient Instructions (Signed)

## 2016-10-20 ENCOUNTER — Observation Stay
Admission: EM | Admit: 2016-10-20 | Discharge: 2016-10-20 | Disposition: A | Discharge status: Home / Self Care | Payer: PRIVATE HEALTH INSURANCE | Attending: Obstetrics and Gynecology | Admitting: Obstetrics and Gynecology

## 2016-10-20 DIAGNOSIS — Z3A37 37 weeks gestation of pregnancy: Secondary | ICD-10-CM | POA: Diagnosis not present

## 2016-10-20 DIAGNOSIS — O471 False labor at or after 37 completed weeks of gestation: Secondary | ICD-10-CM | POA: Diagnosis not present

## 2016-10-20 MED ORDER — ACETAMINOPHEN 500 MG PO TABS
1000.0000 mg | ORAL_TABLET | Freq: Once | ORAL | Status: AC | PRN
  Administered 2016-10-20: 1000 mg via ORAL
  Filled 2016-10-20: qty 2

## 2016-10-20 MED ORDER — ACETAMINOPHEN 500 MG PO TABS: 1000.0000 mg | ORAL_TABLET | Freq: Once | ORAL | Status: AC

## 2016-10-20 NOTE — OB Triage Note (Signed)
Patient arrived in triage with complaints of contractions since approx 0930 this morning, about every 5-7 minutes with intermittent low back pain noted.  Denies leaking of fluid or vaginal bleeding and states good fetal movement. Allegra Grana CNM on unit and to patient's bedside to evaluate as charted. EFM applied. Plan of care explained to patient. Patient verbalized understanding and agreed to plan.

## 2016-10-20 NOTE — OB Triage Note (Signed)
Patient given discharge instructions including labor precautions, fetal kick count instructions and follow up information. Patient verbalized understanding and agreed to plan.

## 2016-10-20 NOTE — OB Triage Note (Signed)
Patient wheeled down to ED in wheelchair per K. Jonmichael Beadnell RN where spouse was waiting for her. Patient in stable condition. Assisted by spouse to vehicle.

## 2016-10-20 NOTE — Progress Notes (Signed)
EFM reapplied. Abdomen palpates soft.  Patient states pain is "better" with the tylenol and that contractions are less intense and have spaced out to about every 7 mins.  Plan to recheck cervix at the 2 hour mark.

## 2016-10-21 NOTE — Progress Notes (Signed)
   L&D OB Triage Note  SUBJECTIVE Robyn Williams is a 31 y.o. [id number] female at [redacted]w[redacted]d, EDD Estimated Date of Delivery: 11/10/16 who presented to triage with complaints of contractions since 0930 in the morning.   Obstetric History   G6   P4   T4   P0   A1   L4    SAB1   TAB0   Ectopic0   Multiple0   Live Births4     # Outcome Date GA Lbr Len/2nd Weight Sex Delivery Anes PTL Lv  6 Current           5 Term 11/04/11 [redacted]w[redacted]d 00:37 / 00:03 6 lb 3 oz (2.807 kg) F Vag-Spont None  LIV     Name: MILES,GIRL Shanai     Apgar1:  8                Apgar5: 8  4 Term 01/03/10    F Vag-Spont EPI N LIV  3 Term 01/12/06    F Vag-Spont EPI N LIV  2 Term 06/27/03    M Vag-Spont EPI N LIV  1 SAB               No prescriptions prior to admission.     OBJECTIVE  Nursing Evaluation:   BP 109/66 (BP Location: Left Arm)   Pulse (!) 103   Temp 98.1 F (36.7 C) (Oral)   Resp 18   Ht  (1.651 m)   Wt 174 lb (78.9 kg)   LMP 03/16/2016 (Within Months)   BMI 28.96 kg/m     I was present at arrival and evaluated Marrisa in person. NST was performed and has been reviewed by me.  NST INTERPRETATION: Reactive Category I  Mode: External Baseline Rate (A): 150 bpm Variability: Moderate Accelerations: 15 x 15 Decelerations: None     Contraction Frequency (min): x2   SVE 1.5 cm, minimal change in exam since yesterday.  ASSESSMENT Impression:  1. Pregnancy:  [id number] at [redacted]w[redacted]d , EDD Estimated Date of Delivery: 11/10/16 2.  reactive  PLAN 1. Reassurance given 2. Discharge home with precautions to return to L&D or call the office if:  increased leakage or fluid, decreased fetal movement, bleeding from vaginal area or contractions that are regular, increasing in intensity and frequency over 1-2 hrs.   3. Continue routine prenatal care. Follow up at next scheduled appointment.   Doreene Burke, CNM

## 2016-10-23 ENCOUNTER — Telehealth: Payer: Self-pay | Admitting: Certified Nurse Midwife

## 2016-10-23 ENCOUNTER — Encounter: Payer: Self-pay | Admitting: Certified Nurse Midwife

## 2016-10-23 NOTE — Telephone Encounter (Signed)
Called pt had lengthy discussion with her regarding what leukocytes mean in urine and the need to trust providers judgement. Pt was very anxious and overwhelmed by UA results as she had been on [employer] and believed that there was something wrong with her kidneys. Advised pt that UAs are checked in office each visit before she leaves and if every there is a concern regarding UA results the provider will address it then. Advised pt on the need to collect clean catch specimens in the future. Pt gave verbal understanding.

## 2016-10-23 NOTE — Telephone Encounter (Signed)
Patient has questions regarding her lab results. She can see them on mychart but she has some concerns.Thanks

## 2016-10-23 NOTE — Telephone Encounter (Signed)
Patient called Labor and Delivery wanting to talk to someone about her urine results. She wants a call back ASAP.

## 2016-10-26 ENCOUNTER — Ambulatory Visit (INDEPENDENT_AMBULATORY_CARE_PROVIDER_SITE_OTHER): Payer: PRIVATE HEALTH INSURANCE | Admitting: Certified Nurse Midwife

## 2016-10-26 VITALS — BP 107/67 | HR 92 | Wt 174.0 lb

## 2016-10-26 DIAGNOSIS — O0933 Supervision of pregnancy with insufficient antenatal care, third trimester: Secondary | ICD-10-CM

## 2016-10-26 DIAGNOSIS — Z3483 Encounter for supervision of other normal pregnancy, third trimester: Secondary | ICD-10-CM

## 2016-10-26 LAB — POCT URINALYSIS DIPSTICK
Bilirubin, UA: NEGATIVE
Glucose, UA: NEGATIVE
KETONES UA: NEGATIVE
NITRITE UA: NEGATIVE
PH UA: 6.5 (ref 5.0–8.0)
PROTEIN UA: NEGATIVE
Spec Grav, UA: 1.02 (ref 1.010–1.025)
Urobilinogen, UA: 0.2 E.U./dL

## 2016-10-26 NOTE — Progress Notes (Signed)
ROB-Pt reports irregular contractions with increased vaginal pressure. Seen at [facility] on 10/20/16 for ROL, no cervical change. Follow up visit with Ortho tomorrow, hopeful to transition to right leg into boot. Reviewed red flag symptoms and when to call. RTC x 1 weeks, will discuss possible induction of labor at next visit.

## 2016-10-26 NOTE — Patient Instructions (Signed)
Third Trimester of Pregnancy The third trimester is from week 28 through week 40 (months 7 through 9). The third trimester is a time when the unborn baby (fetus) is growing rapidly. At the end of the ninth month, the fetus is about 20 inches in length and weighs 6-10 pounds. Body changes during your third trimester Your body will continue to go through many changes during pregnancy. The changes vary from woman to woman. During the third trimester:  Your weight will continue to increase. You can expect to gain 25-35 pounds (11-16 kg) by the end of the pregnancy.  You may begin to get stretch marks on your hips, abdomen, and breasts.  You may urinate more often because the fetus is moving lower into your pelvis and pressing on your bladder.  You may develop or continue to have heartburn. This is caused by increased hormones that slow down muscles in the digestive tract.  You may develop or continue to have constipation because increased hormones slow digestion and cause the muscles that push waste through your intestines to relax.  You may develop hemorrhoids. These are swollen veins (varicose veins) in the rectum that can itch or be painful.  You may develop swollen, bulging veins (varicose veins) in your legs.  You may have increased body aches in the pelvis, back, or thighs. This is due to weight gain and increased hormones that are relaxing your joints.  You may have changes in your hair. These can include thickening of your hair, rapid growth, and changes in texture. Some women also have hair loss during or after pregnancy, or hair that feels dry or thin. Your hair will most likely return to normal after your baby is born.  Your breasts will continue to grow and they will continue to become tender. A yellow fluid (colostrum) may leak from your breasts. This is the first milk you are producing for your baby.  Your belly button may stick out.  You may notice more swelling in your hands,  face, or ankles.  You may have increased tingling or numbness in your hands, arms, and legs. The skin on your belly may also feel numb.  You may feel short of breath because of your expanding uterus.  You may have more problems sleeping. This can be caused by the size of your belly, increased need to urinate, and an increase in your body's metabolism.  You may notice the fetus "dropping," or moving lower in your abdomen (lightening).  You may have increased vaginal discharge.  You may notice your joints feel loose and you may have pain around your pelvic bone.  What to expect at prenatal visits You will have prenatal exams every 2 weeks until week 36. Then you will have weekly prenatal exams. During a routine prenatal visit:  You will be weighed to make sure you and the baby are growing normally.  Your blood pressure will be taken.  Your abdomen will be measured to track your baby's growth.  The fetal heartbeat will be listened to.  Any test results from the previous visit will be discussed.  You may have a cervical check near your due date to see if your cervix has softened or thinned (effaced).  You will be tested for Group B streptococcus. This happens between 35 and 37 weeks.  Your health care provider may ask you:  What your birth plan is.  How you are feeling.  If you are feeling the baby move.  If you have had   any abnormal symptoms, such as leaking fluid, bleeding, severe headaches, or abdominal cramping.  If you are using any tobacco products, including cigarettes, chewing tobacco, and electronic cigarettes.  If you have any questions.  Other tests or screenings that may be performed during your third trimester include:  Blood tests that check for low iron levels (anemia).  Fetal testing to check the health, activity level, and growth of the fetus. Testing is done if you have certain medical conditions or if there are problems during the  pregnancy.  Nonstress test (NST). This test checks the health of your baby to make sure there are no signs of problems, such as the baby not getting enough oxygen. During this test, a belt is placed around your belly. The baby is made to move, and its heart rate is monitored during movement.  What is false labor? False labor is a condition in which you feel small, irregular tightenings of the muscles in the womb (contractions) that usually go away with rest, changing position, or drinking water. These are called Braxton Hicks contractions. Contractions may last for hours, days, or even weeks before true labor sets in. If contractions come at regular intervals, become more frequent, increase in intensity, or become painful, you should see your health care provider. What are the signs of labor?  Abdominal cramps.  Regular contractions that start at 10 minutes apart and become stronger and more frequent with time.  Contractions that start on the top of the uterus and spread down to the lower abdomen and back.  Increased pelvic pressure and dull back pain.  A watery or bloody mucus discharge that comes from the vagina.  Leaking of amniotic fluid. This is also known as your "water breaking." It could be a slow trickle or a gush. Let your health care provider know if it has a color or strange odor. If you have any of these signs, call your health care provider right away, even if it is before your due date. Follow these instructions at home: Medicines  Follow your health care provider's instructions regarding medicine use. Specific medicines may be either safe or unsafe to take during pregnancy.  Take a prenatal vitamin that contains at least 600 micrograms (mcg) of folic acid.  If you develop constipation, try taking a stool softener if your health care provider approves. Eating and drinking  Eat a balanced diet that includes fresh fruits and vegetables, whole grains, good sources of protein  such as meat, eggs, or tofu, and low-fat dairy. Your health care provider will help you determine the amount of weight gain that is right for you.  Avoid raw meat and uncooked cheese. These carry germs that can cause birth defects in the baby.  If you have low calcium intake from food, talk to your health care provider about whether you should take a daily calcium supplement.  Eat four or five small meals rather than three large meals a day.  Limit foods that are high in fat and processed sugars, such as fried and sweet foods.  To prevent constipation: ? Drink enough fluid to keep your urine clear or pale yellow. ? Eat foods that are high in fiber, such as fresh fruits and vegetables, whole grains, and beans. Activity  Exercise only as directed by your health care provider. Most women can continue their usual exercise routine during pregnancy. Try to exercise for 30 minutes at least 5 days a week. Stop exercising if you experience uterine contractions.  Avoid heavy   lifting.  Do not exercise in extreme heat or humidity, or at high altitudes.  Wear low-heel, comfortable shoes.  Practice good posture.  You may continue to have sex unless your health care provider tells you otherwise. Relieving pain and discomfort  Take frequent breaks and rest with your legs elevated if you have leg cramps or low back pain.  Take warm sitz baths to soothe any pain or discomfort caused by hemorrhoids. Use hemorrhoid cream if your health care provider approves.  Wear a good support bra to prevent discomfort from breast tenderness.  If you develop varicose veins: ? Wear support pantyhose or compression stockings as told by your healthcare provider. ? Elevate your feet for 15 minutes, 3-4 times a day. Prenatal care  Write down your questions. Take them to your prenatal visits.  Keep all your prenatal visits as told by your health care provider. This is important. Safety  Wear your seat belt at  all times when driving.  Make a list of emergency phone numbers, including numbers for family, friends, the hospital, and police and fire departments. General instructions  Avoid cat litter boxes and soil used by cats. These carry germs that can cause birth defects in the baby. If you have a cat, ask someone to clean the litter box for you.  Do not travel far distances unless it is absolutely necessary and only with the approval of your health care provider.  Do not use hot tubs, steam rooms, or saunas.  Do not drink alcohol.  Do not use any products that contain nicotine or tobacco, such as cigarettes and e-cigarettes. If you need help quitting, ask your health care provider.  Do not use any medicinal herbs or unprescribed drugs. These chemicals affect the formation and growth of the baby.  Do not douche or use tampons or scented sanitary pads.  Do not cross your legs for long periods of time.  To prepare for the arrival of your baby: ? Take prenatal classes to understand, practice, and ask questions about labor and delivery. ? Make a trial run to the hospital. ? Visit the hospital and tour the maternity area. ? Arrange for maternity or paternity leave through employers. ? Arrange for family and friends to take care of pets while you are in the hospital. ? Purchase a rear-facing car seat and make sure you know how to install it in your car. ? Pack your hospital bag. ? Prepare the baby's nursery. Make sure to remove all pillows and stuffed animals from the baby's crib to prevent suffocation.  Visit your dentist if you have not gone during your pregnancy. Use a soft toothbrush to brush your teeth and be gentle when you floss. Contact a health care provider if:  You are unsure if you are in labor or if your water has broken.  You become dizzy.  You have mild pelvic cramps, pelvic pressure, or nagging pain in your abdominal area.  You have lower back pain.  You have persistent  nausea, vomiting, or diarrhea.  You have an unusual or bad smelling vaginal discharge.  You have pain when you urinate. Get help right away if:  Your water breaks before 37 weeks.  You have regular contractions less than 5 minutes apart before 37 weeks.  You have a fever.  You are leaking fluid from your vagina.  You have spotting or bleeding from your vagina.  You have severe abdominal pain or cramping.  You have rapid weight loss or weight gain.    You have shortness of breath with chest pain.  You notice sudden or extreme swelling of your face, hands, ankles, feet, or legs.  Your baby makes fewer than 10 movements in 2 hours.  You have severe headaches that do not go away when you take medicine.  You have vision changes. Summary  The third trimester is from week 28 through week 40, months 7 through 9. The third trimester is a time when the unborn baby (fetus) is growing rapidly.  During the third trimester, your discomfort may increase as you and your baby continue to gain weight. You may have abdominal, leg, and back pain, sleeping problems, and an increased need to urinate.  During the third trimester your breasts will keep growing and they will continue to become tender. A yellow fluid (colostrum) may leak from your breasts. This is the first milk you are producing for your baby.  False labor is a condition in which you feel small, irregular tightenings of the muscles in the womb (contractions) that eventually go away. These are called Braxton Hicks contractions. Contractions may last for hours, days, or even weeks before true labor sets in.  Signs of labor can include: abdominal cramps; regular contractions that start at 10 minutes apart and become stronger and more frequent with time; watery or bloody mucus discharge that comes from the vagina; increased pelvic pressure and dull back pain; and leaking of amniotic fluid. This information is not intended to replace advice  given to you by your health care provider. Make sure you discuss any questions you have with your health care provider. Document Released: 06/09/2001 Document Revised: 11/21/2015 Document Reviewed: 08/16/2012 Elsevier Interactive Patient Education  2017 [employer]  

## 2016-10-29 ENCOUNTER — Observation Stay
Admission: EM | Admit: 2016-10-29 | Discharge: 2016-10-29 | Disposition: A | Discharge status: Home / Self Care | Payer: PRIVATE HEALTH INSURANCE | Attending: Certified Nurse Midwife | Admitting: Certified Nurse Midwife

## 2016-10-29 DIAGNOSIS — R112 Nausea with vomiting, unspecified: Secondary | ICD-10-CM | POA: Insufficient documentation

## 2016-10-29 DIAGNOSIS — Z91040 Latex allergy status: Secondary | ICD-10-CM | POA: Insufficient documentation

## 2016-10-29 DIAGNOSIS — Z91010 Allergy to peanuts: Secondary | ICD-10-CM | POA: Diagnosis not present

## 2016-10-29 DIAGNOSIS — Z3A38 38 weeks gestation of pregnancy: Secondary | ICD-10-CM | POA: Insufficient documentation

## 2016-10-29 DIAGNOSIS — O26893 Other specified pregnancy related conditions, third trimester: Secondary | ICD-10-CM | POA: Diagnosis not present

## 2016-10-29 DIAGNOSIS — R633 Feeding difficulties: Secondary | ICD-10-CM | POA: Diagnosis not present

## 2016-10-29 DIAGNOSIS — Z880 Allergy status to penicillin: Secondary | ICD-10-CM | POA: Diagnosis not present

## 2016-10-29 DIAGNOSIS — O471 False labor at or after 37 completed weeks of gestation: Principal | ICD-10-CM | POA: Insufficient documentation

## 2016-10-29 LAB — URINALYSIS, ROUTINE W REFLEX MICROSCOPIC
BACTERIA UA: NONE SEEN
BILIRUBIN URINE: NEGATIVE
Glucose, UA: NEGATIVE mg/dL
HGB URINE DIPSTICK: NEGATIVE
Ketones, ur: 20 mg/dL — AB
Nitrite: NEGATIVE
PROTEIN: NEGATIVE mg/dL
Specific Gravity, Urine: 1.016 (ref 1.005–1.030)
pH: 6 (ref 5.0–8.0)

## 2016-10-29 MED ORDER — METOCLOPRAMIDE HCL 10 MG PO TABS: 10.0000 mg | ORAL_TABLET | Freq: Four times a day (QID) | ORAL | 2 refills | Status: DC | PRN

## 2016-10-29 MED ORDER — ONDANSETRON 4 MG PO TBDP: 4.0000 mg | ORAL_TABLET | Freq: Once | ORAL | Status: DC

## 2016-10-29 MED ORDER — MORPHINE SULFATE (PF) 10 MG/ML IV SOLN
10.0000 mg | Freq: Once | INTRAVENOUS | Status: AC | PRN
  Administered 2016-10-29: 10 mg via INTRAMUSCULAR

## 2016-10-29 MED ORDER — MORPHINE SULFATE (PF) 10 MG/ML IV SOLN
INTRAVENOUS | Status: AC
  Administered 2016-10-29: 10 mg via INTRAMUSCULAR
  Filled 2016-10-29: qty 1

## 2016-10-29 MED ORDER — MORPHINE SULFATE (PF) 10 MG/ML IV SOLN
INTRAVENOUS | Status: AC
  Filled 2016-10-29: qty 1

## 2016-10-29 MED ORDER — HYDROXYZINE PAMOATE 25 MG PO CAPS: 25.0000 mg | ORAL_CAPSULE | Freq: Four times a day (QID) | ORAL | 0 refills | Status: DC | PRN

## 2016-10-29 MED ORDER — ACETAMINOPHEN 500 MG PO TABS: 1000.0000 mg | ORAL_TABLET | Freq: Four times a day (QID) | ORAL | Status: DC | PRN

## 2016-10-29 NOTE — Discharge Summary (Signed)
Obstetric Discharge Summary  Patient ID: Robyn Williams MRN: [id number] DOB/AGE: 29/06/1985 29 y.o.   Date of Admission: 10/29/2016 Serafina RoyalsMichelle Samhitha Rosen, CNM [facility]len[facility] Cross(D. Evans, MD)  Date of Discharge: 10/29/2016 Serafina RoyalsMichelle Lyndel Dancel, CNM Charlena Cross(D. Evans, MD)  Admitting Diagnosis: Nausea, vomiting, and contractions at 6539w2d  Secondary Diagnosis: None     Discharge Diagnosis: False labor   L&D OB Triage Note  Robyn Williams is a 31 y.o. [id number] female at 4939w2d, EDD Estimated Date of Delivery: 11/10/16 who presented to triage for complaints of vomiting and abdominal pain.    Subjective:   Robyn Williams reports nausea, vomiting and intermittent abdominal pain since yesterday. She reports difficulty eating and drinking throughout the day, but was able to eat a hamburger and french fries around midnight.   She endorses contractions every five (5) to six (6) minutes. Good fetal movement. No vaginal bleeding or leakage of fluid.   Denies difficulty breathing or respiratory distress, chest pain, dysuria, and leg pain or swelling.   Objective:  Labs: Urinalysis    Component Value Date/Time   COLORURINE YELLOW (A) 10/29/2016 0230   APPEARANCEUR HAZY (A) 10/29/2016 0230   APPEARANCEUR Cloudy (A) 09/14/2016 1537   LABSPEC 1.016 10/29/2016 0230   PHURINE 6.0 10/29/2016 0230   GLUCOSEU NEGATIVE 10/29/2016 0230   HGBUR NEGATIVE 10/29/2016 0230   BILIRUBINUR NEGATIVE 10/29/2016 0230   BILIRUBINUR neg 10/26/2016 1019   BILIRUBINUR Negative 09/14/2016 1537   KETONESUR 20 (A) 10/29/2016 0230   PROTEINUR NEGATIVE 10/29/2016 0230   UROBILINOGEN 0.2 10/26/2016 1019   UROBILINOGEN >=8.0 11/04/2011 0933   NITRITE NEGATIVE 10/29/2016 0230   LEUKOCYTESUR SMALL (A) 10/29/2016 0230   LEUKOCYTESUR Negative 09/14/2016 1537    Physical exam:   Temp:  [98.1 F (36.7 C)-98.2 F (36.8 C)] 98.1 F (36.7 C) (05/03 0726) Pulse Rate:  [113-126] 113 (05/03 0726) Resp:  [18] 18 (05/03 0204) BP: (105-108)/(63-65) 105/63 (05/03  0726) Weight:  [174 lb (78.9 kg)] 174 lb (78.9 kg) (05/03 0204)   General: alert and no distress  Lochia: appropriate  Abdomen: soft, NT  Mode: External Baseline: 120 bpm Variability: moderate Accelerations: present Decelerations: none  Contractions irregular  Extremities: No evidence of DVT seen on physical exam. No lower extremity edema.  Assessment:  G6P4 38+2 wks, nausea and vomiting in pregnancy, NOT IN ACTIVE LABOR  FHR Category I, reactive NST  Plan:   NST performed was reviewed and was found to be reactive. She was discharged home with bleeding/labor precautions.  Continue routine prenatal care. Follow up with OB/GYN as previously scheduled.    Discharge Instructions: Per After Visit Summary.  Activity: Advance as tolerated. Pelvic rest for 6 weeks.  Also refer to  After Visit Summary  Diet: Regular  Medications: Allergies as of 10/29/2016      Reactions   Amoxicillin Other (See Comments)   "makes me feel weird on the inside"   Peanut-containing Drug Products Hives   Phenergan [promethazine Hcl] Hives   Zofran [ondansetron Hcl] Hives   Latex Swelling, Rash      Medication List    TAKE these medications   HYDROcodone-acetaminophen 5-325 MG tablet Commonly known as:  NORCO Take 1 tablet by mouth every 4 (four) hours as needed.   hydrOXYzine 25 MG capsule Commonly known as:  VISTARIL Take 1 capsule (25 mg total) by mouth every 6 (six) hours as needed (contractions).   metoCLOPramide 10 MG tablet Commonly known as:  REGLAN Take 1 tablet (10 mg total) by mouth 4 (four)  times daily as needed for nausea or vomiting.      Outpatient follow up:  Follow-up Information    Serafina Royals, CNM Follow up in 4 day(s).   Specialties:  Certified Nurse Midwife, Obstetrics and Gynecology, Radiology Why:  as previously scheduled Contact information: [address] [place] [address] [phone]          Discharged Condition:  stable  Discharged to: home    Serafina Royals, [place]

## 2016-10-29 NOTE — OB Triage Note (Signed)
Pt presenst to L&D with c/o vomiting since yesterday and abd pain all day today. Reports having difficulty keeping any food or drink down but did eat a hamburger and fries around MN and has been able to keep that down. Pt reports contractions every 5-6 min. Dneies LOF or vaginal bleeding and reports good fetal movement. EFM applied and explained. Plan to monitor fetal and maternal well being and assess for labor.

## 2016-10-30 ENCOUNTER — Inpatient Hospital Stay
Admission: EM | Admit: 2016-10-30 | Discharge: 2016-11-02 | DRG: 775 | Disposition: A | Discharge status: Home / Self Care | Payer: PRIVATE HEALTH INSURANCE | Attending: Obstetrics and Gynecology | Admitting: Obstetrics and Gynecology

## 2016-10-30 DIAGNOSIS — N132 Hydronephrosis with renal and ureteral calculous obstruction: Secondary | ICD-10-CM | POA: Diagnosis present

## 2016-10-30 DIAGNOSIS — N189 Chronic kidney disease, unspecified: Secondary | ICD-10-CM | POA: Diagnosis present

## 2016-10-30 DIAGNOSIS — O9952 Diseases of the respiratory system complicating childbirth: Secondary | ICD-10-CM | POA: Diagnosis present

## 2016-10-30 DIAGNOSIS — N1339 Other hydronephrosis: Secondary | ICD-10-CM

## 2016-10-30 DIAGNOSIS — M549 Dorsalgia, unspecified: Secondary | ICD-10-CM

## 2016-10-30 DIAGNOSIS — O99891 Other specified diseases and conditions complicating pregnancy: Secondary | ICD-10-CM

## 2016-10-30 DIAGNOSIS — O99334 Smoking (tobacco) complicating childbirth: Secondary | ICD-10-CM | POA: Diagnosis present

## 2016-10-30 DIAGNOSIS — Z87442 Personal history of urinary calculi: Secondary | ICD-10-CM

## 2016-10-30 DIAGNOSIS — J45909 Unspecified asthma, uncomplicated: Secondary | ICD-10-CM | POA: Diagnosis present

## 2016-10-30 DIAGNOSIS — F17210 Nicotine dependence, cigarettes, uncomplicated: Secondary | ICD-10-CM | POA: Diagnosis present

## 2016-10-30 DIAGNOSIS — Z3A38 38 weeks gestation of pregnancy: Secondary | ICD-10-CM

## 2016-10-30 DIAGNOSIS — Z8249 Family history of ischemic heart disease and other diseases of the circulatory system: Secondary | ICD-10-CM

## 2016-10-30 DIAGNOSIS — O9989 Other specified diseases and conditions complicating pregnancy, childbirth and the puerperium: Secondary | ICD-10-CM

## 2016-10-30 DIAGNOSIS — O26833 Pregnancy related renal disease, third trimester: Principal | ICD-10-CM | POA: Diagnosis present

## 2016-10-30 DIAGNOSIS — N200 Calculus of kidney: Secondary | ICD-10-CM

## 2016-10-30 DIAGNOSIS — Z6790 Unspecified blood type, Rh positive: Secondary | ICD-10-CM

## 2016-10-30 HISTORY — DX: Personal history of other medical treatment: Z92.89

## 2016-10-30 HISTORY — DX: Supervision of pregnancy with other poor reproductive or obstetric history, unspecified trimester: O09.299

## 2016-10-30 LAB — URINALYSIS, COMPLETE (UACMP) WITH MICROSCOPIC
BILIRUBIN URINE: NEGATIVE
Bacteria, UA: NONE SEEN
GLUCOSE, UA: NEGATIVE mg/dL
KETONES UR: 5 mg/dL — AB
NITRITE: NEGATIVE
PH: 6 (ref 5.0–8.0)
PROTEIN: 30 mg/dL — AB
Specific Gravity, Urine: 1.019 (ref 1.005–1.030)

## 2016-10-30 LAB — URINE CULTURE: Special Requests: NORMAL

## 2016-10-30 LAB — CHLAMYDIA/NGC RT PCR (ARMC ONLY)
Chlamydia Tr: NOT DETECTED
N GONORRHOEAE: NOT DETECTED

## 2016-10-30 LAB — WET PREP, GENITAL
Clue Cells Wet Prep HPF POC: NONE SEEN
Trich, Wet Prep: NONE SEEN
Yeast Wet Prep HPF POC: NONE SEEN

## 2016-10-30 NOTE — OB Triage Note (Signed)
Patient arrived in triage with c/o  Constant sharp/throbbing left lower abdominal pain for approx 2 weeks and lower back pain for the same amount of time.  Patient states positive fetal movement. Denies leaking of fluid and vaginal bleeding.  Abdomen palpates soft, non tender to touch. EFM applied. M. Lawhorn, CNM on unit upon patient's arrival.

## 2016-10-31 ENCOUNTER — Observation Stay: Payer: PRIVATE HEALTH INSURANCE

## 2016-10-31 ENCOUNTER — Inpatient Hospital Stay: Payer: PRIVATE HEALTH INSURANCE

## 2016-10-31 ENCOUNTER — Inpatient Hospital Stay: Payer: PRIVATE HEALTH INSURANCE | Admitting: Anesthesiology

## 2016-10-31 DIAGNOSIS — R1032 Left lower quadrant pain: Secondary | ICD-10-CM | POA: Diagnosis present

## 2016-10-31 DIAGNOSIS — Z3A38 38 weeks gestation of pregnancy: Secondary | ICD-10-CM

## 2016-10-31 DIAGNOSIS — O26833 Pregnancy related renal disease, third trimester: Secondary | ICD-10-CM | POA: Diagnosis present

## 2016-10-31 DIAGNOSIS — N189 Chronic kidney disease, unspecified: Secondary | ICD-10-CM | POA: Diagnosis present

## 2016-10-31 DIAGNOSIS — O99334 Smoking (tobacco) complicating childbirth: Secondary | ICD-10-CM | POA: Diagnosis present

## 2016-10-31 DIAGNOSIS — F17210 Nicotine dependence, cigarettes, uncomplicated: Secondary | ICD-10-CM | POA: Diagnosis present

## 2016-10-31 DIAGNOSIS — N132 Hydronephrosis with renal and ureteral calculous obstruction: Secondary | ICD-10-CM | POA: Diagnosis present

## 2016-10-31 DIAGNOSIS — Z6790 Unspecified blood type, Rh positive: Secondary | ICD-10-CM | POA: Diagnosis not present

## 2016-10-31 DIAGNOSIS — Z8249 Family history of ischemic heart disease and other diseases of the circulatory system: Secondary | ICD-10-CM | POA: Diagnosis not present

## 2016-10-31 DIAGNOSIS — M549 Dorsalgia, unspecified: Secondary | ICD-10-CM | POA: Diagnosis not present

## 2016-10-31 DIAGNOSIS — O26893 Other specified pregnancy related conditions, third trimester: Secondary | ICD-10-CM | POA: Diagnosis not present

## 2016-10-31 DIAGNOSIS — O9952 Diseases of the respiratory system complicating childbirth: Secondary | ICD-10-CM | POA: Diagnosis present

## 2016-10-31 DIAGNOSIS — Z87442 Personal history of urinary calculi: Secondary | ICD-10-CM | POA: Diagnosis not present

## 2016-10-31 DIAGNOSIS — J45909 Unspecified asthma, uncomplicated: Secondary | ICD-10-CM | POA: Diagnosis present

## 2016-10-31 DIAGNOSIS — N1339 Other hydronephrosis: Secondary | ICD-10-CM | POA: Diagnosis not present

## 2016-10-31 LAB — TYPE AND SCREEN
ABO/RH(D): A POS
ANTIBODY SCREEN: NEGATIVE

## 2016-10-31 LAB — CBC
HEMATOCRIT: 30.4 % — AB (ref 35.0–47.0)
HEMOGLOBIN: 10.8 g/dL — AB (ref 12.0–16.0)
MCH: 35.7 pg — AB (ref 26.0–34.0)
MCHC: 35.5 g/dL (ref 32.0–36.0)
MCV: 100.7 fL — AB (ref 80.0–100.0)
Platelets: 234 10*3/uL (ref 150–440)
RBC: 3.01 MIL/uL — ABNORMAL LOW (ref 3.80–5.20)
RDW: 13.8 % (ref 11.5–14.5)
WBC: 5.6 10*3/uL (ref 3.6–11.0)

## 2016-10-31 MED ORDER — SENNOSIDES-DOCUSATE SODIUM 8.6-50 MG PO TABS
2.0000 | ORAL_TABLET | ORAL | Status: DC
  Administered 2016-11-01: 2 via ORAL
  Filled 2016-10-31: qty 2

## 2016-10-31 MED ORDER — MORPHINE SULFATE (PF) 4 MG/ML IV SOLN: 5.0000 mg | Freq: Once | INTRAVENOUS | Status: DC | PRN

## 2016-10-31 MED ORDER — OXYTOCIN 40 UNITS IN LACTATED RINGERS INFUSION - SIMPLE MED
1.0000 m[IU]/min | INTRAVENOUS | Status: DC
  Administered 2016-10-31: 1 m[IU]/min via INTRAVENOUS
  Filled 2016-10-31: qty 1000

## 2016-10-31 MED ORDER — ACETAMINOPHEN 500 MG PO TABS
1000.0000 mg | ORAL_TABLET | Freq: Four times a day (QID) | ORAL | Status: DC | PRN
  Administered 2016-10-31 (×2): 1000 mg via ORAL
  Filled 2016-10-31 (×2): qty 2

## 2016-10-31 MED ORDER — OXYTOCIN BOLUS FROM INFUSION
500.0000 mL | Freq: Once | INTRAVENOUS | Status: DC
  Administered 2016-10-31: 500 mL via INTRAVENOUS

## 2016-10-31 MED ORDER — METOCLOPRAMIDE HCL 10 MG PO TABS: 10.0000 mg | ORAL_TABLET | Freq: Four times a day (QID) | ORAL | Status: DC | PRN

## 2016-10-31 MED ORDER — EPHEDRINE 5 MG/ML INJ: 10.0000 mg | INTRAVENOUS | Status: DC | PRN

## 2016-10-31 MED ORDER — ACETAMINOPHEN 325 MG PO TABS: 650.0000 mg | ORAL_TABLET | ORAL | Status: DC | PRN

## 2016-10-31 MED ORDER — LACTATED RINGERS IV SOLN
INTRAVENOUS | Status: DC
  Administered 2016-10-31: 13:00:00 via INTRAVENOUS

## 2016-10-31 MED ORDER — HYDROCODONE-ACETAMINOPHEN 5-325 MG PO TABS: 1.0000 | ORAL_TABLET | ORAL | Status: DC | PRN

## 2016-10-31 MED ORDER — FENTANYL 2.5 MCG/ML W/ROPIVACAINE 0.2% IN NS 100 ML EPIDURAL INFUSION (ARMC-ANES)
10.0000 mL/h | EPIDURAL | Status: DC
  Administered 2016-10-31: 10 mL/h via EPIDURAL

## 2016-10-31 MED ORDER — BUTORPHANOL TARTRATE 2 MG/ML IJ SOLN
1.0000 mg | INTRAMUSCULAR | Status: DC | PRN
  Administered 2016-10-31: 1 mg via INTRAVENOUS
  Filled 2016-10-31: qty 2

## 2016-10-31 MED ORDER — METOCLOPRAMIDE HCL 5 MG/ML IJ SOLN
10.0000 mg | Freq: Four times a day (QID) | INTRAMUSCULAR | Status: DC | PRN
Start: 2016-10-31 — End: 2016-11-02
  Administered 2016-10-31: 10 mg via INTRAVENOUS
  Filled 2016-10-31 (×2): qty 2

## 2016-10-31 MED ORDER — PHENYLEPHRINE 40 MCG/ML (10ML) SYRINGE FOR IV PUSH (FOR BLOOD PRESSURE SUPPORT): 80.0000 ug | PREFILLED_SYRINGE | INTRAVENOUS | Status: DC | PRN

## 2016-10-31 MED ORDER — LIDOCAINE-EPINEPHRINE (PF) 1.5 %-1:200000 IJ SOLN
INTRAMUSCULAR | Status: DC | PRN
  Administered 2016-10-31: 3 mL via PERINEURAL

## 2016-10-31 MED ORDER — DIPHENHYDRAMINE HCL 50 MG/ML IJ SOLN: 12.5000 mg | INTRAMUSCULAR | Status: DC | PRN

## 2016-10-31 MED ORDER — LACTATED RINGERS IV SOLN: 500.0000 mL | Freq: Once | INTRAVENOUS | Status: DC

## 2016-10-31 MED ORDER — BUPIVACAINE HCL (PF) 0.25 % IJ SOLN
INTRAMUSCULAR | Status: DC | PRN
  Administered 2016-10-31: 10 mL via EPIDURAL

## 2016-10-31 MED ORDER — LACTATED RINGERS IV SOLN: 500.0000 mL | INTRAVENOUS | Status: DC | PRN

## 2016-10-31 MED ORDER — IBUPROFEN 100 MG/5ML PO SUSP
600.0000 mg | Freq: Four times a day (QID) | ORAL | Status: DC | PRN
  Administered 2016-10-31 – 2016-11-02 (×6): 600 mg via ORAL
  Filled 2016-10-31 (×9): qty 30

## 2016-10-31 MED ORDER — AMMONIA AROMATIC IN INHA
RESPIRATORY_TRACT | Status: AC
  Filled 2016-10-31: qty 10

## 2016-10-31 MED ORDER — MISOPROSTOL 200 MCG PO TABS
ORAL_TABLET | ORAL | Status: AC
  Administered 2016-10-31: 800 ug via RECTAL
  Filled 2016-10-31: qty 4

## 2016-10-31 MED ORDER — SIMETHICONE 80 MG PO CHEW
80.0000 mg | CHEWABLE_TABLET | ORAL | Status: DC | PRN
Start: 2016-10-31 — End: 2016-11-02

## 2016-10-31 MED ORDER — OXYCODONE-ACETAMINOPHEN 5-325 MG PO TABS: 1.0000 | ORAL_TABLET | ORAL | Status: DC | PRN

## 2016-10-31 MED ORDER — BENZOCAINE-MENTHOL 20-0.5 % EX AERO
1.0000 "application " | INHALATION_SPRAY | CUTANEOUS | Status: DC | PRN
  Administered 2016-11-01: 1 via TOPICAL
  Filled 2016-10-31: qty 56

## 2016-10-31 MED ORDER — DIBUCAINE 1 % RE OINT: 1.0000 "application " | TOPICAL_OINTMENT | RECTAL | Status: DC | PRN

## 2016-10-31 MED ORDER — FENTANYL 2.5 MCG/ML W/ROPIVACAINE 0.2% IN NS 100 ML EPIDURAL INFUSION (ARMC-ANES)
EPIDURAL | Status: AC
  Administered 2016-10-31: 17:00:00
  Filled 2016-10-31: qty 100

## 2016-10-31 MED ORDER — WITCH HAZEL-GLYCERIN EX PADS: 1.0000 "application " | MEDICATED_PAD | CUTANEOUS | Status: DC | PRN

## 2016-10-31 MED ORDER — OXYCODONE-ACETAMINOPHEN 5-325 MG PO TABS: 2.0000 | ORAL_TABLET | ORAL | Status: DC | PRN

## 2016-10-31 MED ORDER — IBUPROFEN 600 MG PO TABS
600.0000 mg | ORAL_TABLET | Freq: Four times a day (QID) | ORAL | Status: DC
Start: 2016-11-01 — End: 2016-10-31

## 2016-10-31 MED ORDER — ZOLPIDEM TARTRATE 5 MG PO TABS: 5.0000 mg | ORAL_TABLET | Freq: Every evening | ORAL | Status: DC | PRN

## 2016-10-31 MED ORDER — DIPHENHYDRAMINE HCL 25 MG PO CAPS: 25.0000 mg | ORAL_CAPSULE | Freq: Four times a day (QID) | ORAL | Status: DC | PRN

## 2016-10-31 MED ORDER — LIDOCAINE HCL (PF) 1 % IJ SOLN
INTRAMUSCULAR | Status: AC
  Filled 2016-10-31: qty 30

## 2016-10-31 MED ORDER — OXYTOCIN 40 UNITS IN LACTATED RINGERS INFUSION - SIMPLE MED
2.5000 [IU]/h | INTRAVENOUS | Status: DC
  Administered 2016-10-31: 2.5 [IU]/h via INTRAVENOUS
  Filled 2016-10-31: qty 1000

## 2016-10-31 MED ORDER — OXYTOCIN 10 UNIT/ML IJ SOLN
INTRAMUSCULAR | Status: AC
  Filled 2016-10-31: qty 2

## 2016-10-31 MED ORDER — MORPHINE SULFATE (PF) 10 MG/ML IV SOLN
INTRAVENOUS | Status: AC
  Administered 2016-10-31: 5 mg
  Filled 2016-10-31: qty 1

## 2016-10-31 MED ORDER — TETANUS-DIPHTH-ACELL PERTUSSIS 5-2.5-18.5 LF-MCG/0.5 IM SUSP
0.5000 mL | Freq: Once | INTRAMUSCULAR | Status: DC
  Filled 2016-10-31: qty 0.5

## 2016-10-31 MED ORDER — TAMSULOSIN HCL 0.4 MG PO CAPS
0.4000 mg | ORAL_CAPSULE | Freq: Every day | ORAL | Status: DC
  Administered 2016-10-31 – 2016-11-02 (×3): 0.4 mg via ORAL
  Filled 2016-10-31 (×5): qty 1

## 2016-10-31 MED ORDER — LIDOCAINE HCL (PF) 1 % IJ SOLN
INTRAMUSCULAR | Status: DC | PRN
  Administered 2016-10-31: 3 mL

## 2016-10-31 MED ORDER — COCONUT OIL OIL
1.0000 "application " | TOPICAL_OIL | Status: DC | PRN
  Administered 2016-11-01: 1 via TOPICAL
  Filled 2016-10-31: qty 120

## 2016-10-31 MED ORDER — ACETAMINOPHEN 160 MG/5ML PO SOLN
650.0000 mg | Freq: Four times a day (QID) | ORAL | Status: DC | PRN
  Administered 2016-10-31 – 2016-11-02 (×5): 650 mg via ORAL
  Filled 2016-10-31 (×7): qty 20.3

## 2016-10-31 MED ORDER — LIDOCAINE HCL (PF) 1 % IJ SOLN: 30.0000 mL | INTRAMUSCULAR | Status: DC | PRN

## 2016-10-31 MED ORDER — LACTATED RINGERS IV SOLN
INTRAVENOUS | Status: DC
  Administered 2016-10-31: 09:00:00 via INTRAVENOUS

## 2016-10-31 MED ORDER — MISOPROSTOL 25 MCG QUARTER TABLET
50.0000 ug | ORAL_TABLET | Freq: Once | ORAL | Status: AC
  Administered 2016-10-31: 50 ug via VAGINAL
  Filled 2016-10-31: qty 2

## 2016-10-31 MED ORDER — PRENATAL MULTIVITAMIN CH
1.0000 | ORAL_TABLET | Freq: Every day | ORAL | Status: DC
  Filled 2016-10-31 (×2): qty 1

## 2016-10-31 MED ORDER — SOD CITRATE-CITRIC ACID 500-334 MG/5ML PO SOLN: 30.0000 mL | ORAL | Status: DC | PRN

## 2016-10-31 NOTE — Anesthesia Preprocedure Evaluation (Addendum)
Anesthesia Evaluation  Patient identified by MRN, date of birth, ID band Patient awake    Reviewed: Allergy & Precautions, NPO status , Patient's Chart, lab work & pertinent test results  Airway Mallampati: II       Dental no notable dental hx.    Pulmonary asthma , Current Smoker,    Pulmonary exam normal        Cardiovascular negative cardio ROS Normal cardiovascular exam     Neuro/Psych PSYCHIATRIC DISORDERS Depression negative neurological ROS     GI/Hepatic negative GI ROS, Neg liver ROS,   Endo/Other  negative endocrine ROS  Renal/GU Renal disease  negative genitourinary   Musculoskeletal negative musculoskeletal ROS (+)   Abdominal Normal abdominal exam  (+)   Peds negative pediatric ROS (+)  Hematology  (+) anemia ,   Anesthesia Other Findings   Reproductive/Obstetrics (+) Pregnancy                            Anesthesia Physical Anesthesia Plan  ASA: II  Anesthesia Plan: Epidural   Post-op Pain Management:    Induction: Intravenous  Airway Management Planned: Natural Airway  Additional Equipment:   Intra-op Plan:   Post-operative Plan:   Informed Consent: I have reviewed the patients History and Physical, chart, labs and discussed the procedure including the risks, benefits and alternatives for the proposed anesthesia with the patient or authorized representative who has indicated his/her understanding and acceptance.   Dental advisory given  Plan Discussed with: CRNA and Surgeon  Anesthesia Plan Comments:         Anesthesia Quick Evaluation

## 2016-10-31 NOTE — Anesthesia Procedure Notes (Signed)
Epidural Patient location during procedure: OB Start time: 10/31/2016 4:25 PM End time: 10/31/2016 4:38 PM  Staffing Anesthesiologist: Yves DillARROLL, Francely Craw Performed: anesthesiologist   Preanesthetic Checklist Completed: patient identified, site marked, surgical consent, pre-op evaluation, timeout performed, IV checked, risks and benefits discussed and monitors and equipment checked  Epidural Patient position: sitting Prep: Betadine and site prepped and draped Patient monitoring: heart rate, continuous pulse ox, blood pressure and cardiac monitor Approach: midline Location: L3-L4 Injection technique: LOR air  Needle:  Needle type: Tuohy  Needle gauge: 18 G Needle length: 9 cm and 9 Catheter type: closed end Catheter size: 20 Guage Test dose: negative and 1.5% lidocaine with Epi 1:200 K  Assessment Events: blood not aspirated, injection not painful, no injection resistance, negative IV test and no paresthesia  Additional Notes Time out called.  Patient placed in sitting position.  Back prepped and draped in sterile fashion.  A skin wheal was made in the L3-L4 interspace with 1% lidocaine plain .  An 18G Tuohy needle was guided into the epidural space by a loss of resistance technique.  The epidural catheter was threaded into the epidural space 3 cm and the TD was negative.  No blood or paresthesias.  The patient tolerated the procedure well.  The epidural catheter was affixed to the back in sterile fashion.Reason for block:procedure for pain

## 2016-10-31 NOTE — Progress Notes (Signed)
Urology  Case discussed with Serafina RoyalsMichelle Lawhorn, CNM this AM.  Right flank, UA + RBC, and severe right hydronephrosis consistent with probably obstructing ureteral stone.  Renal ultrasounds and labs reviewed.  Patient is currently in active labor and will likely deliver tonight.  Recommend obtaining CT stone protocol soon after delivery, make her NPO at MN.    Will see first thing tomorrow AM to devise plan based on size and location of stone as well as pain level.  Please call Urology STAT if patient develops fevers to 100.4 or higher overnight.    Vanna ScotlandAshley Kentavius Dettore, MD

## 2016-10-31 NOTE — Progress Notes (Addendum)
Clent RidgesJasmine C XXXMiles is a 31 y.o. [id number] at 5926w5d by ultrasound admitted labor management.  Subjective:  Pt sitting in bed, breathing through contractions. She would like something for pain management.   Denies difficulty breathing or respiratory distress, chest pain, vaginal bleeding, and leg pain or swelling.   Objective:  Temp:  [98 F (36.7 C)-98.5 F (36.9 C)] 98.2 F (36.8 C) (05/05 1515) Pulse Rate:  [102-131] 115 (05/05 1420) Resp:  [16-18] 16 (05/05 0630) BP: (101-121)/(66-74) 121/71 (05/05 1420) SpO2:  [96 %-100 %] 100 % (05/04 2257) Weight:  [174 lb (78.9 kg)] 174 lb (78.9 kg) (05/04 2257)  FHT:  FHR: 140 bpm, variability: moderate,  accelerations:  Present,  decelerations:  Absent   UC:   regular, every one (1) to two (2) minutes with soft resting tone  SVE:   Dilation: 3 Effacement (%): 50 Station: -2, -3 Exam by:: LSE  Labs: Lab Results  Component Value Date   WBC 5.6 10/31/2016   HGB 10.8 (L) 10/31/2016   HCT 30.4 (L) 10/31/2016   MCV 100.7 (H) 10/31/2016   PLT 234 10/31/2016    Assessment:   G6P4, 413w4d estimated date of birth: 11/16/16, Rh positive, GBS negative  FHR Category I  Plan:  Discussed MD vs CNM care. Pt desires the care of CNM. Reviewed research related to augmentation of labor at 6113w4d. Pt desires to continue with the POC established by Dr. Logan BoresEvans.   Epidural for pain management.   Continue orders as written. Reassess as needed.    Gunnar BullaJenkins Michelle Jacquelynne Guedes, CNM 10/31/2016, 3:18 PM

## 2016-10-31 NOTE — Progress Notes (Signed)
Dr Apolinar JunesBrandon, urology called to notify of elevated temp, 100.6, oral, Tylenol 650mg  po was given. Per Dr Apolinar JunesBrandon, obtain CT scan and keep pt NPO tonight, she will review results upon completion of CT scan and call with any updates to poc. Denies need for antibiotics at this time, continue to monitor and if pt spikes another temp, notify.

## 2016-10-31 NOTE — Progress Notes (Signed)
Pt mentioning she is unable to swallow pill form of postpartum med, Tylenol and Motrin, requesting liquid form of same med, spoke with Lawhorn, CNM, med change requested, order received to modify order to liquid Tylenol and Motrin po.

## 2016-10-31 NOTE — H&P (Signed)
History and Physical   HPI  Robyn Williams is a 31 y.o. [id number] at 7257w5d Estimated Date of Delivery: 11/16/16 who is being admitted for  labor management.  Pt having severe back pain from kidney stones ( diagnosed by U/S) and unable to differentiate them from contractions.  She has transportation issues as well [place].   She has shown cervical change over the last 12 hours changing from 1 - 3 cm.   H/O low amniotic fluid in lat pregnancy which improved with hydration.   OB History  Obstetric History   G6   P4   T4   P0   A1   L4    SAB1   TAB0   Ectopic0   Multiple0   Live Births4     # Outcome Date GA Lbr Len/2nd Weight Sex Delivery Anes PTL Lv  6 Current           5 Term 11/04/11 3545w4d 00:37 / 00:03 6 lb 3 oz (2.807 kg) F Vag-Spont None  LIV     Name: Robyn Williams     Apgar1:  8                Apgar5: 8  4 Term 01/03/10    F Vag-Spont EPI N LIV  3 Term 01/12/06    F Vag-Spont EPI N LIV  2 Term 06/27/03    M Vag-Spont EPI N LIV  1 SAB               PROBLEM LIST  Pregnancy complications or risks: Patient Active Problem List   Diagnosis Date Noted  . Indication for care in labor and delivery, antepartum 10/30/2016  . Labor and delivery indication for care or intervention 10/29/2016  . Labor and delivery, indication for care 10/20/2016  . Late prenatal care affecting pregnancy in third trimester 09/14/2016    Prenatal labs and studies: ABO, Rh: A/Positive/-- (03/19 1531) Antibody: Negative (03/19 1531) Rubella: 8.79 (03/19 1531) RPR: Non Reactive (03/19 1531)  HBsAg: Negative (03/19 1531)  HIV: Non Reactive (03/19 1531)     Past Medical History:  Diagnosis Date  . Anemia   . Asthma    with pregnancy  . Chronic kidney disease    kidney stones  . Depression    doing good  . Fracture, foot   . Urinary tract infection      Past Surgical History:  Procedure Laterality Date  . NO PAST SURGERIES       Medications     Current Discharge Medication List    CONTINUE these medications which have NOT CHANGED   Details  hydrOXYzine (VISTARIL) 25 MG capsule Take 1 capsule (25 mg total) by mouth every 6 (six) hours as needed (contractions). Qty: 30 capsule, Refills: 0    metoCLOPramide (REGLAN) 10 MG tablet Take 1 tablet (10 mg total) by mouth 4 (four) times daily as needed for nausea or vomiting. Qty: 30 tablet, Refills: 2    HYDROcodone-acetaminophen (NORCO) 5-325 MG tablet Take 1 tablet by mouth every 4 (four) hours as needed. Qty: 20 tablet, Refills: 0         Allergies  Amoxicillin; Bee venom; Peanut-containing drug products; Phenergan [promethazine hcl]; Zofran [ondansetron hcl]; and Latex  Review of Systems  Pertinent items are noted in HPI.  Physical Exam  BP 111/74   Pulse (!) 105   Temp 98.4 F (36.9 C) (Oral)   Resp 16   Ht 5\' 5"  (  1.651 m)   Wt 174 lb (78.9 kg)   LMP 03/16/2016 (Within Months)   SpO2 100%   BMI 28.96 kg/m   Lungs:  CTA B Cardio: RRR without M/R/G Abd: Soft, gravid, NT Presentation: cephalic EXT: No C/C/ 1+ Edema DTRs: 2+ B CERVIX: 3 cm:50%: -2: mid position: moderately firm  See Prenatal records for more detailed PE.     FHR:  Variability: Good {> 6 bpm)  Accels present  Toco: Uterine Contractions: Irregular  Test Results  Results for orders placed or performed during the hospital encounter of 10/30/16 (from the past 24 hour(s))  Urinalysis, Complete w Microscopic     Status: Abnormal   Collection Time: 10/30/16  8:28 PM  Result Value Ref Range   Color, Urine AMBER (A) YELLOW   APPearance CLOUDY (A) CLEAR   Specific Gravity, Urine 1.019 1.005 - 1.030   pH 6.0 5.0 - 8.0   Glucose, UA NEGATIVE NEGATIVE mg/dL   Hgb urine dipstick SMALL (A) NEGATIVE   Bilirubin Urine NEGATIVE NEGATIVE   Ketones, ur 5 (A) NEGATIVE mg/dL   Protein, ur 30 (A) NEGATIVE mg/dL   Nitrite NEGATIVE NEGATIVE   Leukocytes, UA MODERATE (A) NEGATIVE   RBC / HPF 6-30  0 - 5 RBC/hpf   WBC, UA TOO NUMEROUS TO COUNT 0 - 5 WBC/hpf   Bacteria, UA NONE SEEN NONE SEEN   Squamous Epithelial / LPF 0-5 (A) NONE SEEN   Mucous PRESENT   Wet prep, genital     Status: Abnormal   Collection Time: 10/30/16  8:28 PM  Result Value Ref Range   Yeast Wet Prep HPF POC NONE SEEN NONE SEEN   Trich, Wet Prep NONE SEEN NONE SEEN   Clue Cells Wet Prep HPF POC NONE SEEN NONE SEEN   WBC, Wet Prep HPF POC MODERATE (A) NONE SEEN   Sperm PRESENT   Chlamydia/NGC rt PCR ([facility] only)     Status: None   Collection Time: 10/30/16  8:28 PM  Result Value Ref Range   Specimen source GC/Chlam ENDOCERVICAL    Chlamydia Tr NOT DETECTED NOT DETECTED   N gonorrhoeae NOT DETECTED NOT DETECTED   Group B Strep negative  Assessment   [id number] at [redacted]w[redacted]d Estimated Date of Delivery: 11/16/16  The fetus is reassuring.  Kidney stones and associated pain. Latent labor with cervical change. Social and transportation issues.   Patient Active Problem List   Diagnosis Date Noted  . Indication for care in labor and delivery, antepartum 10/30/2016  . Labor and delivery indication for care or intervention 10/29/2016  . Labor and delivery, indication for care 10/20/2016  . Late prenatal care affecting pregnancy in third trimester 09/14/2016    Plan  1. Admit to L&D for planned vaginal delviery 2. EFM: -- Category 1 3. Epidural if desired. Stadol for IV pain until epidural requested. 4. Admission labs  5. Will augment latent phase labor with misoprostol.  Pitocin later PRN. 6.  Urology consult PP.  Elonda Husky, M.D. 10/31/2016 10:42 AM

## 2016-10-31 NOTE — H&P (Signed)
Obstetric History and Physical  Robyn Williams is a 31 y.o. [phone] with IUP at 5959w4d presenting with intermittent back pain and lower left abdominal pain. Patient reports irregular contractions, but denies symptoms of labor.   Denies difficulty breathing or respiratory distress, chest pain, vaginal bleeding, leakage of fluid, and leg pain or swelling.  History significant for kidney stones, last 2010.  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 Anemia   . Asthma    with pregnancy  . Chronic kidney disease    kidney stones  . Depression    doing good  . Fracture, foot   . Urinary tract infection     Past Surgical History:  Procedure Laterality Date  . NO PAST SURGERIES      OB History  Gravida Para Term Preterm AB Living  6 4 4  0 1 4  SAB TAB Ectopic Multiple Live Births  1 0 0 0 4    # Outcome Date GA Lbr Len/2nd Weight Sex Delivery Anes PTL Lv  6 Current           5 Term 11/04/11 5213w4d 00:37 / 00:03 6 lb 3 oz (2.807 kg) F Vag-Spont None  LIV     Birth Comments: wnl  4 Term 01/03/10    F Vag-Spont EPI N LIV  3 Term 01/12/06    F Vag-Spont EPI N LIV  2 Term 06/27/03    M Vag-Spont EPI N LIV  1 SAB               Social History   Social History  . Marital status: Married    Spouse name: N/A  . Number of children: N/A  . Years of education: N/A   Social History Main Topics  .  Smoking status: Current Some Day Smoker    Years: 0.50    Types: Cigarettes  . Smokeless tobacco: Never Used  . Alcohol use No     Comment: Sparingly   . Drug use: No     Comment: last use 3 weeks ago  . Sexual activity: Yes    Birth control/ protection: None   Other Topics Concern  . None   Social History Narrative  . None    Family History  Problem Relation Age of Onset  . Hypertension Mother   . Cancer Paternal Grandmother   . Anesthesia problems Neg Hx   . Hearing loss Neg Hx   . Asthma Neg Hx   . Diabetes Neg Hx     Prescriptions Prior to Admission  Medication Sig Dispense Refill Last Dose  . hydrOXYzine (VISTARIL) 25 MG capsule Take 1 capsule (25 mg total) by mouth every 6 (six) hours as needed (contractions). 30 capsule 0 10/30/2016 at 1730  . metoCLOPramide (REGLAN) 10 MG tablet Take 1 tablet (10 mg total) by mouth 4 (four) times daily as needed for nausea or vomiting. 30  tablet 2 10/29/2016 at Unknown time  . HYDROcodone-acetaminophen (NORCO) 5-325 MG tablet Take 1 tablet by mouth every 4 (four) hours as needed. (Patient not taking: Reported on 10/29/2016) 20 tablet 0 Not Taking at Unknown time    Allergies  Allergen Reactions  . Amoxicillin Other (See Comments)    "makes me feel weird on the inside"  . Peanut-Containing Drug Products Hives  . Phenergan [Promethazine Hcl] Hives  . Zofran [Ondansetron Hcl] Hives  . Latex Swelling and Rash    Review of Systems: Negative except for what is mentioned in HPI.  Physical Exam:  Temp:  [98 F (36.7 C)-98.5 F (36.9 C)] 98.4 F (36.9 C) (05/05 0630) Pulse Rate:  [102-131] 105 (05/05 0630) Resp:  [16-18] 16 (05/05 0630) BP: (101-111)/(66-74) 111/74 (05/05 0630) SpO2:  [96 %-100 %] 100 % (05/04 2257) Weight:  [174 lb (78.9 kg)] 174 lb (78.9 kg) (05/04 2257)  GENERAL: Well-developed, well-nourished female in no acute distress.   LUNGS: Clear to auscultation bilaterally.   HEART: Regular rate and  rhythm.  ABDOMEN: Soft, tender, nondistended, gravid. Right side CVAT.  EXTREMITIES: Nontender, no edema, 2+ distal pulses.  Cervical Exam: Dilation: 1 Effacement (%): 40 Cervical Position: Posterior Station: -3 Exam by:: [facility] RN   FHT:  Baseline rate 125 bpm   Variability moderate  Accelerations present   Decelerations none  Contractions: Irriability   Pertinent Labs/Studies:   Results for orders placed or performed during the hospital encounter of 10/30/16 (from the past 24 hour(s))  Urinalysis, Complete w Microscopic     Status: Abnormal   Collection Time: 10/30/16  8:28 PM  Result Value Ref Range   Color, Urine AMBER (A) YELLOW   APPearance CLOUDY (A) CLEAR   Specific Gravity, Urine 1.019 1.005 - 1.030   pH 6.0 5.0 - 8.0   Glucose, UA NEGATIVE NEGATIVE mg/dL   Hgb urine dipstick SMALL (A) NEGATIVE   Bilirubin Urine NEGATIVE NEGATIVE   Ketones, ur 5 (A) NEGATIVE mg/dL   Protein, ur 30 (A) NEGATIVE mg/dL   Nitrite NEGATIVE NEGATIVE   Leukocytes, UA MODERATE (A) NEGATIVE   RBC / HPF 6-30 0 - 5 RBC/hpf   WBC, UA TOO NUMEROUS TO COUNT 0 - 5 WBC/hpf   Bacteria, UA NONE SEEN NONE SEEN   Squamous Epithelial / LPF 0-5 (A) NONE SEEN   Mucous PRESENT   Wet prep, genital     Status: Abnormal   Collection Time: 10/30/16  8:28 PM  Result Value Ref Range   Yeast Wet Prep HPF POC NONE SEEN NONE SEEN   Trich, Wet Prep NONE SEEN NONE SEEN   Clue Cells Wet Prep HPF POC NONE SEEN NONE SEEN   WBC, Wet Prep HPF POC MODERATE (A) NONE SEEN   Sperm PRESENT   Chlamydia/NGC rt PCR ([facility] only)     Status: None   Collection Time: 10/30/16  8:28 PM  Result Value Ref Range   Specimen source GC/Chlam ENDOCERVICAL    Chlamydia Tr NOT DETECTED NOT DETECTED   N gonorrhoeae NOT DETECTED NOT DETECTED   EXAM: RENAL / URINARY TRACT ULTRASOUND COMPLETE  COMPARISON:  Ultrasound of June 19, 2009.  FINDINGS: Right Kidney:  Length: 13.6 cm. Echogenicity within normal limits. No  mass visualized. Severe hydronephrosis and proximal ureteral dilatation is noted.  Left Kidney:  Length: 11.8 cm. 6 mm calculus is noted in midpole. Echogenicity within normal limits. No mass or hydronephrosis visualized.  Bladder:  Appears normal for degree of bladder distention.  Left ureteral jet is visualized. Right ureteral jet is not visualized.  IMPRESSION: Nonobstructive left renal calculus. Severe right hydronephrosis is noted concerning for distal ureteral obstruction.   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 Spoke with Dr. Apolinar Junes. Will start Flomax 0.4 mg daily. If patient afebrile and pain controlled with oral narcotics, then may discharge home. Follow up postpartum with low dose non contrast CT Scan.    Gunnar Bulla, CNM [facility]   Discussed with Dr. Logan Bores and he agrees with this plan of care.

## 2016-11-01 ENCOUNTER — Inpatient Hospital Stay: Payer: PRIVATE HEALTH INSURANCE | Admitting: Anesthesiology

## 2016-11-01 ENCOUNTER — Encounter
Admission: EM | Disposition: A | Discharge status: Home / Self Care | Payer: Self-pay | Source: Home / Self Care | Attending: Obstetrics and Gynecology

## 2016-11-01 DIAGNOSIS — O9989 Other specified diseases and conditions complicating pregnancy, childbirth and the puerperium: Secondary | ICD-10-CM

## 2016-11-01 DIAGNOSIS — O26893 Other specified pregnancy related conditions, third trimester: Secondary | ICD-10-CM

## 2016-11-01 DIAGNOSIS — Z87442 Personal history of urinary calculi: Secondary | ICD-10-CM

## 2016-11-01 DIAGNOSIS — N1339 Other hydronephrosis: Secondary | ICD-10-CM

## 2016-11-01 DIAGNOSIS — M549 Dorsalgia, unspecified: Secondary | ICD-10-CM

## 2016-11-01 DIAGNOSIS — O99891 Other specified diseases and conditions complicating pregnancy: Secondary | ICD-10-CM

## 2016-11-01 LAB — CBC
HCT: 28.1 % — ABNORMAL LOW (ref 35.0–47.0)
Hemoglobin: 9.6 g/dL — ABNORMAL LOW (ref 12.0–16.0)
MCH: 34.3 pg — ABNORMAL HIGH (ref 26.0–34.0)
MCHC: 34 g/dL (ref 32.0–36.0)
MCV: 100.8 fL — ABNORMAL HIGH (ref 80.0–100.0)
Platelets: 214 10*3/uL (ref 150–440)
RBC: 2.79 MIL/uL — ABNORMAL LOW (ref 3.80–5.20)
RDW: 13.7 % (ref 11.5–14.5)
WBC: 9.8 10*3/uL (ref 3.6–11.0)

## 2016-11-01 LAB — RPR: RPR: NONREACTIVE

## 2016-11-01 LAB — URINE CULTURE
CULTURE: NO GROWTH
Special Requests: NORMAL

## 2016-11-01 SURGERY — LIGATION, FALLOPIAN TUBE, POSTPARTUM
Anesthesia: General | Laterality: Bilateral

## 2016-11-01 SURGERY — Surgical Case
Anesthesia: *Unknown

## 2016-11-01 MED ORDER — FERROUS SULFATE 325 (65 FE) MG PO TABS
325.0000 mg | ORAL_TABLET | Freq: Three times a day (TID) | ORAL | Status: DC
  Administered 2016-11-01 – 2016-11-02 (×3): 325 mg via ORAL
  Filled 2016-11-01 (×3): qty 1

## 2016-11-01 MED ORDER — OXYCODONE-ACETAMINOPHEN 5-325 MG PO TABS
1.0000 | ORAL_TABLET | ORAL | Status: DC | PRN
  Administered 2016-11-01 (×3): 1 via ORAL
  Filled 2016-11-01 (×4): qty 1

## 2016-11-01 SURGICAL SUPPLY — 16 items
BLADE SURG 15 STRL LF DISP TIS (BLADE) ×1 IMPLANT
BLADE SURG 15 STRL SS (BLADE) ×1
BNDG ADH 2 X3.75 FABRIC TAN LF (GAUZE/BANDAGES/DRESSINGS) ×2 IMPLANT
CHLORAPREP W/TINT 26ML (MISCELLANEOUS) ×2 IMPLANT
DRAPE LAPAROTOMY 100X77 ABD (DRAPES) ×2 IMPLANT
GLOVE ORTHO TXT STRL SZ7.5 (GLOVE) ×2 IMPLANT
GOWN STRL REUS W/ TWL LRG LVL3 (GOWN DISPOSABLE) ×2 IMPLANT
GOWN STRL REUS W/TWL LRG LVL3 (GOWN DISPOSABLE) ×2
KIT RM TURNOVER CYSTO AR (KITS) ×2 IMPLANT
LABEL OR SOLS (LABEL) ×2 IMPLANT
NEEDLE HYPO 25GX1X1/2 BEV (NEEDLE) ×2 IMPLANT
NS IRRIG 500ML POUR BTL (IV SOLUTION) ×2 IMPLANT
PACK BASIN MINOR ARMC (MISCELLANEOUS) ×2 IMPLANT
SUT VIC AB 4-0 PS2 18 (SUTURE) ×2 IMPLANT
SUT VICRYL 0 AB UR-6 (SUTURE) ×4 IMPLANT
SYRINGE 10CC LL (SYRINGE) ×2 IMPLANT

## 2016-11-01 NOTE — Progress Notes (Signed)
Post Partum Day 1/2  Subjective:  Pt is sitting up in bed, talking on the phone. FOB and infant at bedside with other family members.   Denies difficulty breathing or respiratory distress, chest pain, abdominal pain, excessive vaginal bleeding, and leg pain or swelling.   Pt was seen my Urology this morning and no recommendations were made for intervention at this time.  Pt desires BTL. Consent signed today and copy given to pt.   Objective:  Temp:  [98 F (36.7 C)-100.6 F (38.1 C)] 98.1 F (36.7 C) (05/06 0926) Pulse Rate:  [91-239] 91 (05/06 0926) Resp:  [18] 18 (05/06 0926) BP: (88-145)/(47-94) 106/76 (05/06 0926) SpO2:  [93 %-100 %] 100 % (05/06 0926)  Physical Exam:   General: alert and cooperative   Lochia: appropriate  Uterine Fundus: firm  DVT Evaluation: No evidence of DVT seen on physical exam. Negative Homan's sign.   Recent Labs  10/31/16 1152 11/01/16 0643  HGB 10.8* 9.6*  HCT 30.4* 28.1*    Assessment:  G6P5 s/p vaginal birth, kidney stones, Rh positive, breastfeeding  Plan:  Discussed routine postpartum care, red flag symptoms, and when to call.   Repeat renal US in four (4) weeks per Urology consult.   Anticipate d/c tomorrow morning. PPV in four (4) weeks. Interval tubal at six (6) weeks.   Continue orders as written. Reassess as needed.    LOS: 1 day     Gunnar BullaJenkins Michelle Marlaine Arey, CNM 11/01/2016, 10:12 AM

## 2016-11-01 NOTE — Anesthesia Postprocedure Evaluation (Signed)
Anesthesia Post Note  Patient: Robyn Williams  Procedure(s) Performed: * No procedures listed *  Patient location during evaluation: Mother Baby Anesthesia Type: Epidural Level of consciousness: oriented and awake and alert Pain management: pain level controlled Vital Signs Assessment: post-procedure vital signs reviewed and stable Respiratory status: spontaneous breathing Cardiovascular status: blood pressure returned to baseline Postop Assessment: no headache and no backache Anesthetic complications: no     Last Vitals:  Vitals:   11/01/16 0446 11/01/16 0926  BP: 113/76 106/76  Pulse: 98 91  Resp: 18 18  Temp: 36.7 C 36.7 C    Last Pain:  Vitals:   11/01/16 1249  TempSrc:   PainSc: 5                  Andre Gallego

## 2016-11-01 NOTE — Anesthesia Preprocedure Evaluation (Addendum)
Anesthesia Evaluation  Patient identified by MRN, date of birth, ID band Patient awake    Reviewed: Allergy & Precautions, NPO status , Patient's Chart, lab work & pertinent test results  Airway Mallampati: II  TM Distance: >3 FB     Dental no notable dental hx.    Pulmonary asthma , Current Smoker,    Pulmonary exam normal        Cardiovascular negative cardio ROS Normal cardiovascular exam     Neuro/Psych PSYCHIATRIC DISORDERS Depression negative neurological ROS     GI/Hepatic negative GI ROS, Neg liver ROS,   Endo/Other  negative endocrine ROS  Renal/GU Renal disease  negative genitourinary   Musculoskeletal negative musculoskeletal ROS (+)   Abdominal Normal abdominal exam  (+)   Peds negative pediatric ROS (+)  Hematology  (+) anemia ,   Anesthesia Other Findings   Reproductive/Obstetrics (+) Pregnancy                             Anesthesia Physical  Anesthesia Plan  ASA: II and emergent  Anesthesia Plan: General   Post-op Pain Management:    Induction: Intravenous  Airway Management Planned: Oral ETT  Additional Equipment:   Intra-op Plan:   Post-operative Plan: Extubation in OR  Informed Consent: I have reviewed the patients History and Physical, chart, labs and discussed the procedure including the risks, benefits and alternatives for the proposed anesthesia with the patient or authorized representative who has indicated his/her understanding and acceptance.   Dental advisory given  Plan Discussed with: CRNA and Surgeon  Anesthesia Plan Comments:         Anesthesia Quick Evaluation

## 2016-11-01 NOTE — Consult Note (Signed)
Urology Consult  I have been asked to see the patient by Dr. Lemar LoftyEvans/ Michelle Lawhorn, for evaluation and management of right hydroneprhosis.  Chief Complaint: right flank pain  History of Present Illness: Robyn Williams is a 31 y.o. year old with a personal history of kidney stones who presented yesterday with left lower quadrant pain, diffuse pain across her back in latent labor. Renal ultrasound at the time of initial triage showed severe right hydronephrosis along with evidence of microscopic blood in the urine not previously present concerning for possible objection ureteral calculus. Her labor progressed throughout the day yesterday and ultimately she delivered a healthy baby boy yesterday evening.  She since undergone CT stone protocol which shows moderate/severe hydronephrosis of the right kidney with a right extrarenal pelvis without a definitive obstructing stone. She does have a 4 mm left nonobstructing stone.  She does have a personal history of kidney stones, spontaneously passed a right-sided stone in 2006.   She was able to catch the stone. Interval renal ultrasound showed resolution of her hydronephrosis. No further episodes of stone, never requiring surgical intervention.  She did have a low-grade temp yesterday to 100.8 overnight, otherwise has been hemodynamically stable. Her pain is improving since delivery and she appears overall well this morning.  No nausea and vomiting.    Past Medical History:  Diagnosis Date  . Anemia   . Asthma    with pregnancy  . Chronic kidney disease    kidney stones  . Depression    doing good  . Fracture, foot   . History of blood transfusion    received after 1st pregnancy  . Hx of postpartum hemorrhage, currently pregnant    with last pregnancy, 1 week after discharge, told d/t retained placenta  . Urinary tract infection     Past Surgical History:  Procedure Laterality Date  . NO PAST SURGERIES      Home Medications:   Current Meds  Medication Sig  . hydrOXYzine (VISTARIL) 25 MG capsule Take 1 capsule (25 mg total) by mouth every 6 (six) hours as needed (contractions).  . metoCLOPramide (REGLAN) 10 MG tablet Take 1 tablet (10 mg total) by mouth 4 (four) times daily as needed for nausea or vomiting.    Allergies:  Allergies  Allergen Reactions  . Amoxicillin Other (See Comments)    "makes me feel weird on the inside"  . Bee Venom Swelling  . Peanut-Containing Drug Products Hives  . Phenergan [Promethazine Hcl] Hives  . Zofran [Ondansetron Hcl] Hives  . Latex Swelling and Rash    Family History  Problem Relation Age of Onset  . Hypertension Mother   . Cancer Paternal Grandmother   . Anesthesia problems Neg Hx   . Hearing loss Neg Hx   . Asthma Neg Hx   . Diabetes Neg Hx     Social History:  reports that she has been smoking Cigarettes.  She has smoked for the past 0.50 years. She has never used smokeless tobacco. She reports that she does not drink alcohol or use drugs.  ROS: A complete review of systems was performed.  All systems are negative except for pertinent findings as noted.  Physical Exam:  Vital signs in last 24 hours: Temp:  [98 F (36.7 C)-100.6 F (38.1 C)] 98 F (36.7 C) (05/06 0446) Pulse Rate:  [92-239] 98 (05/06 0446) Resp:  [18] 18 (05/06 0446) BP: (88-145)/(47-94) 113/76 (05/06 0446) SpO2:  [93 %-99 %] 99 % (05/06  1610) Constitutional:  Alert and oriented, No acute distress.  Infant at bedside. HEENT: [place] AT, moist mucus membranes.  Trachea midline, no masses Cardiovascular: Regular rate and rhythm, no clubbing, cyanosis, or edema. Respiratory: Normal respiratory effort, lungs clear bilaterally GI: Abdomen remains gravity and appearance, newly postpartum, uterus firm. GU: No CVA tenderness Skin: No rashes, bruises or suspicious lesions Neurologic: Grossly intact, no focal deficits, moving all 4 extremities Psychiatric: Normal mood and affect   Laboratory  Data:   Recent Labs  10/31/16 1152 11/01/16 0643  WBC 5.6 9.8  HGB 10.8* 9.6*  HCT 30.4* 28.1*   No results for input(s): NA, K, CL, CO2, GLUCOSE, BUN, CREATININE, CALCIUM in the last 72 hours. No results for input(s): LABPT, INR in the last 72 hours. No results for input(s): LABURIN in the last 72 hours. Results for orders placed or performed during the hospital encounter of 10/30/16  Wet prep, genital     Status: Abnormal   Collection Time: 10/30/16  8:28 PM  Result Value Ref Range Status   Yeast Wet Prep HPF POC NONE SEEN NONE SEEN Final   Trich, Wet Prep NONE SEEN NONE SEEN Final   Clue Cells Wet Prep HPF POC NONE SEEN NONE SEEN Final   WBC, Wet Prep HPF POC MODERATE (A) NONE SEEN Final   Sperm PRESENT  Final  Chlamydia/NGC rt PCR ([facility] only)     Status: None   Collection Time: 10/30/16  8:28 PM  Result Value Ref Range Status   Specimen source GC/Chlam ENDOCERVICAL  Final   Chlamydia Tr NOT DETECTED NOT DETECTED Final   N gonorrhoeae NOT DETECTED NOT DETECTED Final    Comment: (NOTE) 100  This methodology has not been evaluated in pregnant women or in 200  patients with a history of hysterectomy. 300 400  This methodology will not be performed on patients less than 59  years of age.      Radiologic Imaging: US Renal  Result Date: 10/31/2016 CLINICAL DATA:  Back pain. EXAM: RENAL / URINARY TRACT ULTRASOUND COMPLETE COMPARISON:  Ultrasound of June 19, 2009. FINDINGS: Right Kidney: Length: 13.6 cm. Echogenicity within normal limits. No mass visualized. Severe hydronephrosis and proximal ureteral dilatation is noted. Left Kidney: Length: 11.8 cm. 6 mm calculus is noted in midpole. Echogenicity within normal limits. No mass or hydronephrosis visualized. Bladder: Appears normal for degree of bladder distention. Left ureteral jet is visualized. Right ureteral jet is not visualized. IMPRESSION: Nonobstructive left renal calculus. Severe right hydronephrosis is noted concerning  for distal ureteral obstruction. Electronically Signed   By: Lupita Raider, M.D.   On: 10/31/2016 07:55   Ct Renal Stone Study  Result Date: 11/01/2016 CLINICAL DATA:  Bilateral flank pain for 3 weeks EXAM: CT ABDOMEN AND PELVIS WITHOUT CONTRAST TECHNIQUE: Multidetector CT imaging of the abdomen and pelvis was performed following the standard protocol without IV contrast. COMPARISON:  Ultrasound 10/31/2016 FINDINGS: Lower chest: Lung bases demonstrate no acute consolidation or effusion. Normal heart size. Hepatobiliary: Punctate stone in the gallbladder. No wall thickening. No focal hepatic abnormality. No biliary dilatation Pancreas: Unremarkable. No pancreatic ductal dilatation or surrounding inflammatory changes. Spleen: Normal in size without focal abnormality. Adrenals/Urinary Tract: Adrenal glands within normal limits. 4 mm stone in the mid left kidney. No left hydronephrosis. Moderate right hydronephrosis with moderate severe dilatation of right extrarenal pelvis and proximal right ureter. No definite calcified stones seen along the course of the right ureter. The bladder contains air. Punctate lobe right pelvic  calcification appears separate from the ureter. Stomach/Bowel: Stomach is within normal limits. Appendix appears normal. No evidence of bowel wall thickening, distention, or inflammatory changes. Vascular/Lymphatic: No significant vascular findings are present. No enlarged abdominal or pelvic lymph nodes. Reproductive: Enlarged uterus. Hemorrhagic material and hypoechoic fluid within the endometrium, lower uterine segment and cervix. No adnexal mass. Other: No free air or free fluid. Musculoskeletal: No acute or significant osseous findings. IMPRESSION: 1. There is moderate severe hydronephrosis of the right kidney with enlarged right extrarenal pelvis and hydroureter. No definite ureteral stone is visualized. 2. Nonobstructing stone in the mid left kidney. Air within the bladder is likely due to  recent instrumentation. 3. Enlarged uterus containing hemorrhagic material within the lower uterine segment and cervix, consistent with recent postpartum status. Electronically Signed   By: Mariafernanda Pang M.D.   On: 11/01/2016 00:42   CT scan and renal ultrasound were personally reviewed today. This was also compared to previous CT scan back in 2006 as well as an interval renal ultrasound.  Assessment/ Plan:  31 year old female newly postpartum day 1 presenting yesterday with diffuse back pain, incidental right hydronephrosis and microscopic blood concerning for possible obstructing ureteral calculus. CT stone protocol overnight confirms no obstructing right-sided ureteral calculus.  1. Back pain- likely related to labor and is improving since delivery  2. Right hydronephrosis-differential diagnosis includes most likely incidental physiologic related to pregnancy (appearance exacerbated by presence of physiologic extrarenal pelvis), chronic related to possible underlying anatomic issues at his UPJ or stricture, or recently passed stone. No intervention recommended at this time. Discussed with the patient today, with like to reassess with follow-up renal ultrasound in 4 weeks to assess for improvement/resolution. If hydronephrosis fails to resolve, recommend outpatinet referral to urology.  3. Nonobstructing left renal calculus- 4 mm nonobstructing left-sided stone. No intervention recommended at this time, generic stone prevention techniques discussed with the patient.  4. Microscopic hematuria/possible UTI- follow-up urine culture and treat as needed.   11/01/2016, 7:39 AM  Vanna Scotland,  MD

## 2016-11-01 NOTE — Plan of Care (Signed)
Problem: Nutritional: Goal: Dietary intake will improve Outcome: Not Progressing NPO for BTL later this morning if 30 Day Papers brought from office.

## 2016-11-02 ENCOUNTER — Encounter: Payer: PRIVATE HEALTH INSURANCE | Admitting: Certified Nurse Midwife

## 2016-11-02 MED ORDER — TAMSULOSIN HCL 0.4 MG PO CAPS: 0.4000 mg | ORAL_CAPSULE | Freq: Every day | ORAL | 1 refills | Status: DC

## 2016-11-02 MED ORDER — FERROUS SULFATE 325 (65 FE) MG PO TABS: 325.0000 mg | ORAL_TABLET | Freq: Three times a day (TID) | ORAL | 3 refills | Status: DC

## 2016-11-02 MED ORDER — VARICELLA VIRUS VACCINE LIVE 1350 PFU/0.5ML IJ SUSR
0.5000 mL | Freq: Once | INTRAMUSCULAR | Status: AC
  Administered 2016-11-02: 0.5 mL via SUBCUTANEOUS
  Filled 2016-11-02: qty 0.5

## 2016-11-02 NOTE — Progress Notes (Signed)
All discharge instructions given to patient and she voices understanding of all instructions given. She will make her own f/u appointment.  Patient discharged home with infant in arms escorted out by cna.

## 2016-11-02 NOTE — Anesthesia Postprocedure Evaluation (Signed)
Anesthesia Post Note  Patient: Robyn RidgesJasmine C Williams  Procedure(s) Performed: * No procedures listed *  Patient location during evaluation: Mother Baby Anesthesia Type: Epidural Level of consciousness: awake, awake and alert and oriented Pain management: pain level controlled Vital Signs Assessment: post-procedure vital signs reviewed and stable Respiratory status: spontaneous breathing, nonlabored ventilation and respiratory function stable Cardiovascular status: blood pressure returned to baseline and stable Postop Assessment: no headache and no backache Anesthetic complications: no     Last Vitals:  Vitals:   11/02/16 0000 11/02/16 0400  BP:    Pulse:    Resp:    Temp: 36.7 C 37.2 C    Last Pain:  Vitals:   11/02/16 0230  TempSrc:   PainSc: 4                  Chiropodisttephanie Kylon [place]

## 2016-11-02 NOTE — Discharge Summary (Signed)
Obstetric Discharge Summary  Patient ID: Robyn Williams MRN: [id number] DOB/AGE: 29/06/1985 29 y.o.   Date of Admission: 10/30/2016 Robyn Williams, CNM Robyn Williams(D. Evans, MD)  Date of Discharge: 11/02/2016 Robyn Williams, CNM Robyn Williams(D. Evans, MD)  Admitting Diagnosis: Induction of labor and kidney stones at 4542w5d  Secondary Diagnosis: None  Mode of Delivery: normal spontaneous vaginal delivery     Discharge Diagnosis: No other diagnosis   Intrapartum Procedures: Atificial rupture of membranes, epidural and pitocin augmentation   Post partum procedures: None  Complications: none   Brief Hospital Course  Robyn Williams is a [id number] who had a SVD on 10/31/2016;  for further details of this birth, please refer to the delivey note.  Patient's postpartum course was complicated by kidney stones and hydronephrosis requiring Urology consult.  MRI completed in hospital and read by Dr. Apolinar Williams with recommendations for repeat renal ultrasound in four (4) week. By time of discharge on PPD#2, her pain was controlled on oral pain medications; she had appropriate lochia and was ambulating, voiding without difficulty and tolerating regular diet.  She was deemed stable for discharge to home.    Labs: CBC Latest Ref Rng & Units 11/01/2016 10/31/2016 09/28/2016  WBC 3.6 - 11.0 K/uL 9.8 5.6 -  Hemoglobin 12.0 - 16.0 g/dL 1.4(N9.6(L) 10.8(L) -  Hematocrit 35.0 - 47.0 % 28.1(L) 30.4(L) 31.1(L)  Platelets 150 - 440 K/uL 214 234 -   A POS  Physical exam:   Blood pressure (!) 97/57, pulse 92, temperature 99 F (37.2 C), resp. rate 18, height 5\' 5"  (1.651 m), weight 174 lb (78.9 kg), last menstrual period 03/16/2016, SpO2 100 %, unknown if currently breastfeeding.   General: alert and no distress  Heart: RRR  Lungs: CTAB  Lochia: appropriate  Abdomen: soft, NT  Uterine Fundus: firm  Laceration: None  Extremities: No evidence of DVT seen on physical exam. No lower  extremity edema.  Discharge  Instructions: Per After Visit Summary.  Activity: Advance as tolerated. Pelvic rest for 6 weeks.  Also refer to After Visit Summary  Diet: Regular  Medications:  Allergies as of 11/02/2016      Reactions   Amoxicillin Other (See Comments)   "makes me feel weird on the inside"   Bee Venom Swelling   Peanut-containing Drug Products Hives   Phenergan [promethazine Hcl] Hives   Zofran [ondansetron Hcl] Hives   Latex Swelling, Rash      Medication List    STOP taking these medications   HYDROcodone-acetaminophen 5-325 MG tablet Commonly known as:  NORCO   hydrOXYzine 25 MG capsule Commonly known as:  VISTARIL   metoCLOPramide 10 MG tablet Commonly known as:  REGLAN     TAKE these medications   ferrous sulfate 325 (65 FE) MG tablet Take 1 tablet (325 mg total) by mouth 3 (three) times daily with meals.   tamsulosin 0.4 MG Caps capsule Commonly known as:  FLOMAX Take 1 capsule (0.4 mg total) by mouth daily.      Outpatient follow up:    Postpartum contraception: abstinence, bilateral tubal ligation  Discharged Condition: stable  Discharged to: home   Newborn Data: Disposition:home with mother  Apgars: APGAR (1 MIN): 7   APGAR (5 MINS): 9    Baby Feeding: Breast    Robyn Williams, CNM

## 2016-11-02 NOTE — Lactation Note (Signed)
This note was copied from a baby's chart. Lactation Consultation Note  Patient Name: Boy Kerri PerchesJasmine XXXMiles ONGEX'BToday's Date: 11/02/2016 Reason for consult: Follow-up assessment   Maternal Data    Feeding Feeding Type: Breast Fed Length of feed: 10 min  LATCH Score/Interventions Latch: Grasps breast easily, tongue down, lips flanged, rhythmical sucking. Intervention(s): Assist with latch;Adjust position  Audible Swallowing: A few with stimulation Intervention(s): Hand expression Intervention(s): Hand expression  Type of Nipple: Everted at rest and after stimulation  Comfort (Breast/Nipple): Soft / non-tender     Hold (Positioning): Assistance needed to correctly position infant at breast and maintain latch. Intervention(s): Breastfeeding basics reviewed;Position options  LATCH Score: 8  Lactation Tools Discussed/Used     Consult Status Consult Status: Complete Date: 11/02/16 [facility] Knows how to work hand pump for manual expression as well as knowing how to hand express. Mom has LC line in case she has questions, concerns, or needs an outpatient consultation.    Burnadette PeterJaniya M Avion Patella 11/02/2016, 12:13 PM

## 2016-11-02 NOTE — Discharge Instructions (Signed)
Follow up sooner with fever, problems breathing, pain not helped by medications, severe depression( more than just baby blues, wanting to hurt yourself or the baby), severe bleeding ( saturating more than one pad an hour or large palm sized clots), no heavy lifting , no driving while taking narcotics, no douches, intercourse, tampons or enemas for 6 weeks  °

## 2016-11-03 ENCOUNTER — Other Ambulatory Visit: Payer: Self-pay | Admitting: Certified Nurse Midwife

## 2016-11-03 DIAGNOSIS — N1330 Unspecified hydronephrosis: Secondary | ICD-10-CM

## 2016-11-03 DIAGNOSIS — N200 Calculus of kidney: Secondary | ICD-10-CM

## 2016-11-30 ENCOUNTER — Encounter: Payer: PRIVATE HEALTH INSURANCE | Admitting: Obstetrics and Gynecology

## 2016-12-02 ENCOUNTER — Encounter: Payer: PRIVATE HEALTH INSURANCE | Admitting: Obstetrics and Gynecology

## 2016-12-04 ENCOUNTER — Ambulatory Visit: Payer: PRIVATE HEALTH INSURANCE

## 2016-12-14 ENCOUNTER — Encounter: Payer: Self-pay | Admitting: Certified Nurse Midwife

## 2016-12-14 ENCOUNTER — Encounter: Payer: PRIVATE HEALTH INSURANCE | Admitting: Certified Nurse Midwife

## 2017-07-15 IMAGING — CT CT RENAL STONE PROTOCOL
3 of 4 series · 8 of 46 positions shown, 15 images · non-contrast
Comparison: Ultrasound 10/31/2016

CLINICAL DATA: Bilateral flank pain for 3 weeks

EXAM:
CT ABDOMEN AND PELVIS WITHOUT CONTRAST
TECHNIQUE: Multidetector CT imaging of the abdomen and pelvis was performed
following the standard protocol without IV contrast.

[Series 4: lung bases · axial · 0.81mm/px · z∈[-82,-17]mm · 4 of 23 slices shown, 9 images]
[im 5/23  soft-tissue]
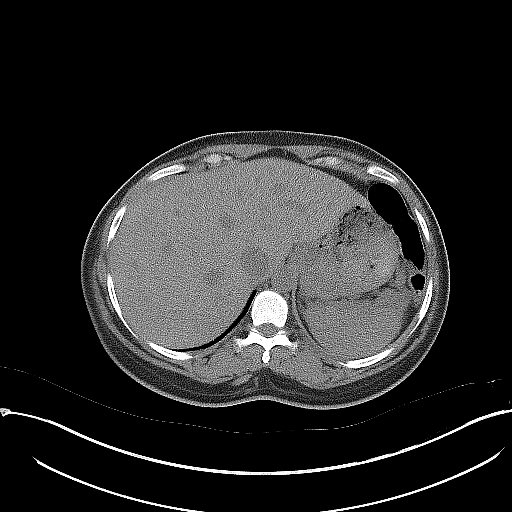
[im 5/23  lung]
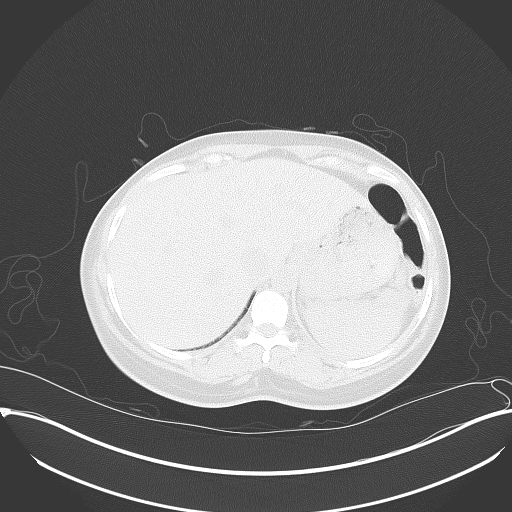
[im 5/23  bone]
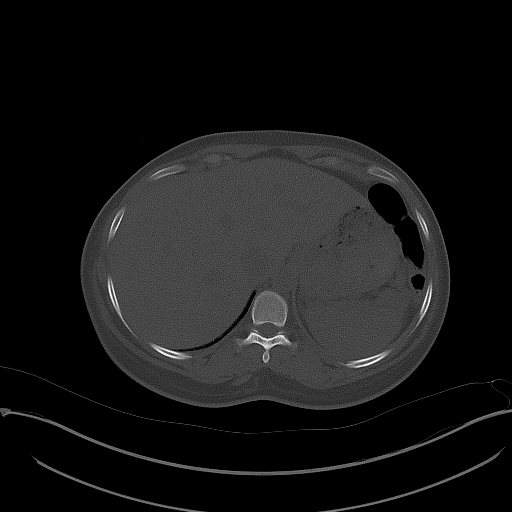
[im 9/23  soft-tissue]
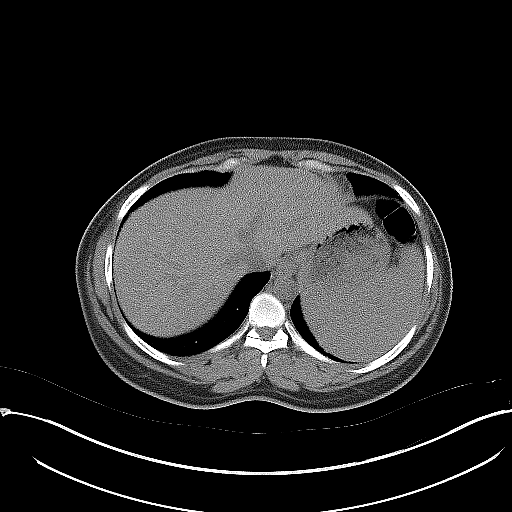
[im 9/23  lung]
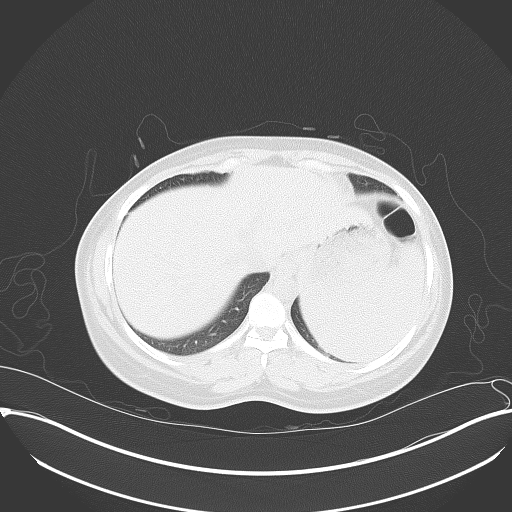
[im 14/23  soft-tissue]
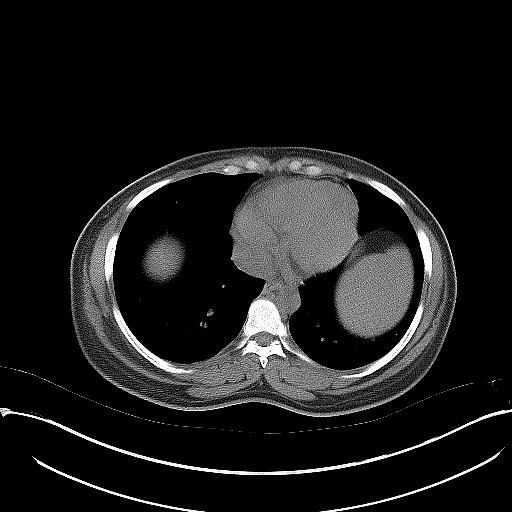
[im 14/23  lung]
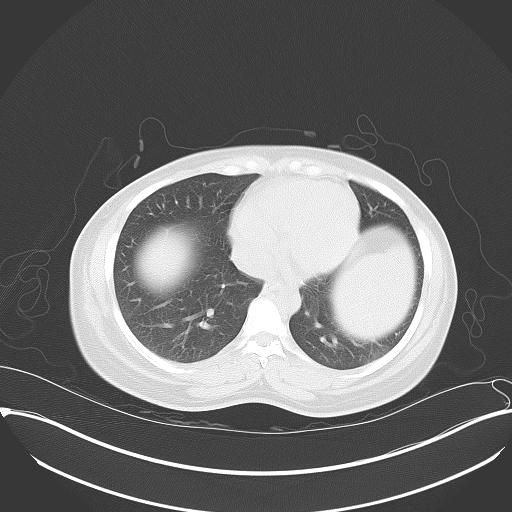
[im 18/23  soft-tissue]
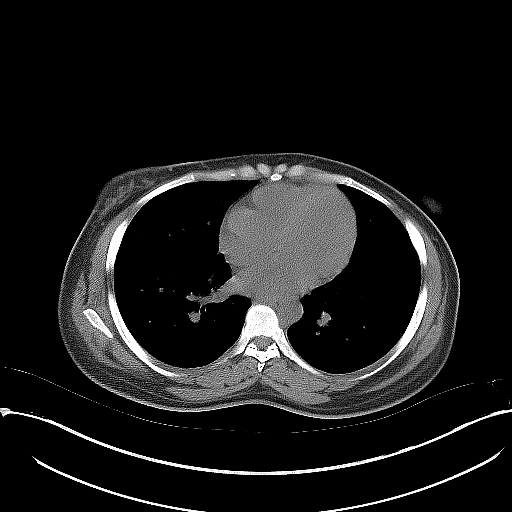
[im 18/23  lung]
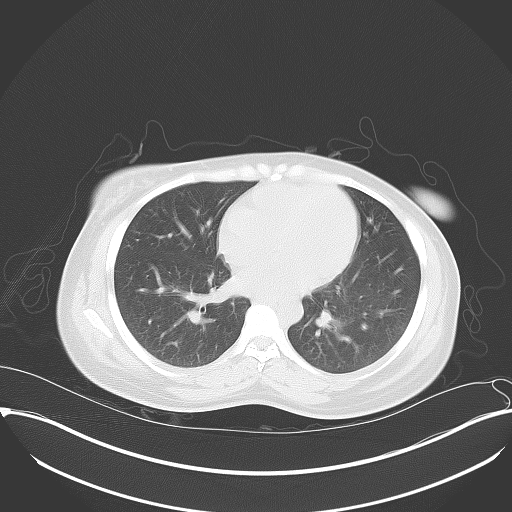

[Series 5: coronal · coronal · 0.84mm/px · 3 of 121 slices shown, 4 images]
[im 41/121  soft-tissue]
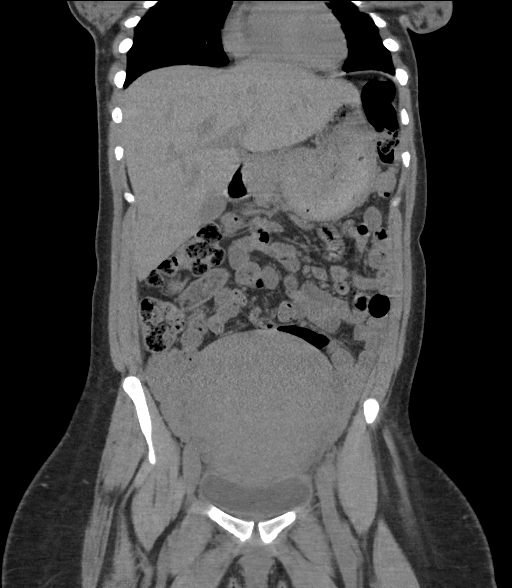
[im 54/121  soft-tissue]
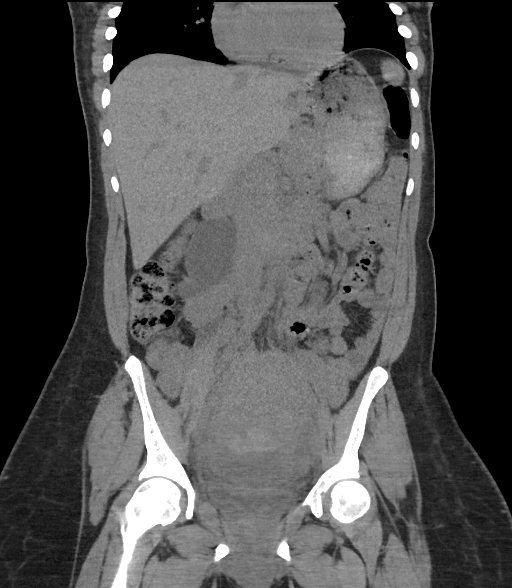
[im 54/121  bone]
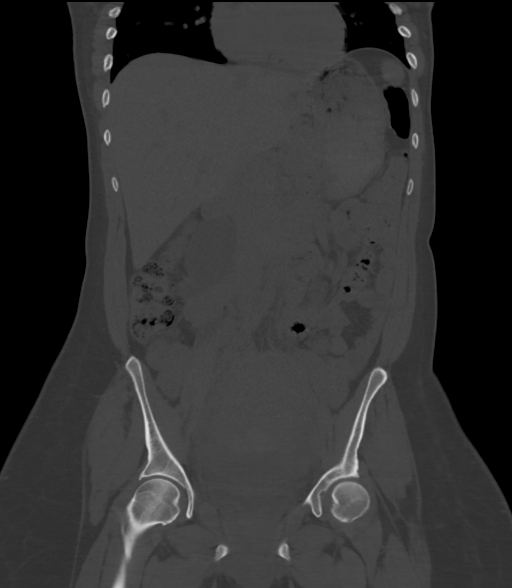
[im 67/121  soft-tissue]
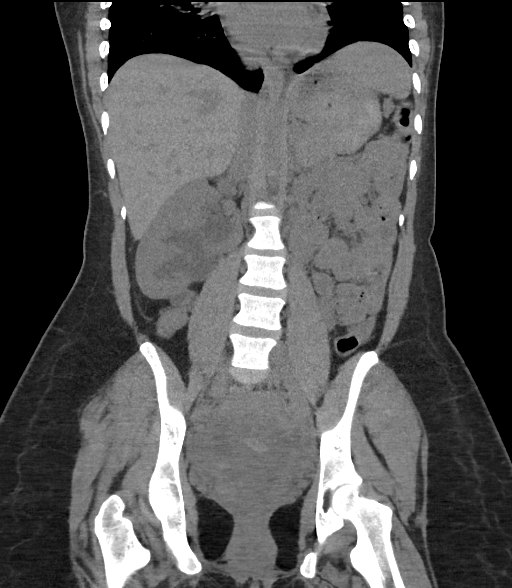

[Series 6: sagittal · sagittal · 0.57mm/px · 1 of 178 slices shown, 2 images]
[im 60/178  soft-tissue]
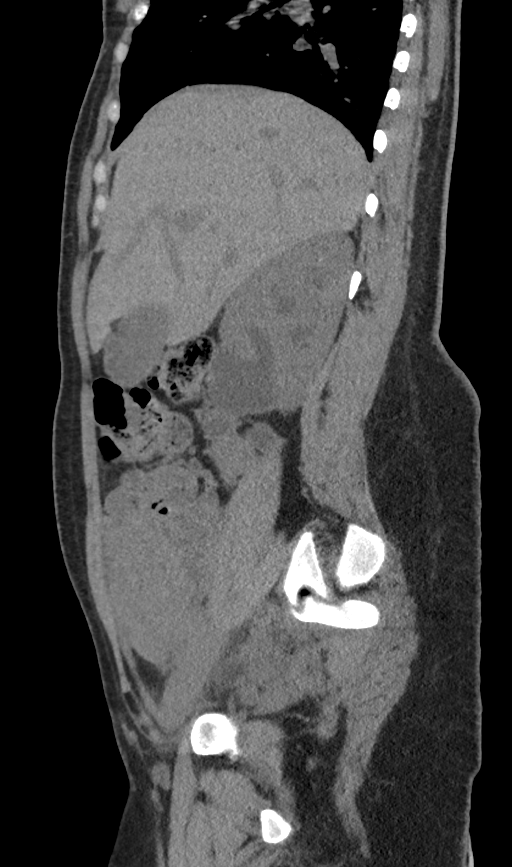
[im 60/178  bone]
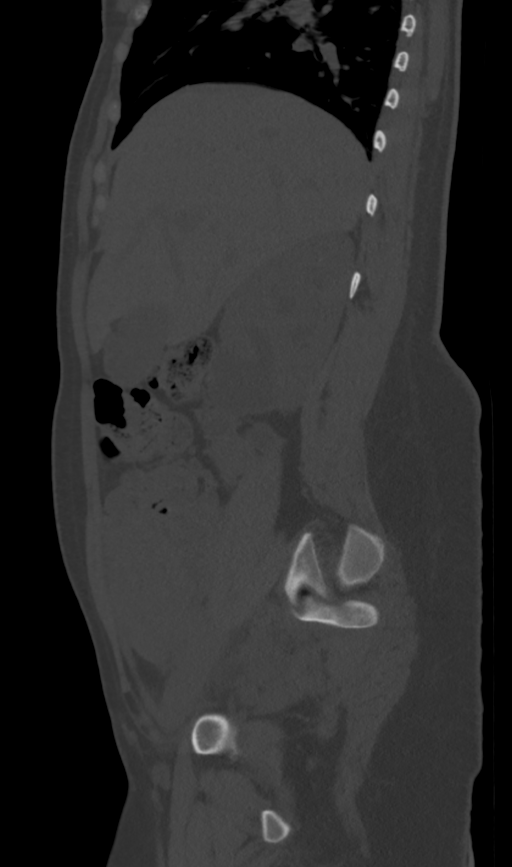

[8 of 46 positions shown; findings below may reference images not displayed]

FINDINGS: Lower chest: Lung bases demonstrate no acute consolidation or
effusion. Normal heart size.

Hepatobiliary: Punctate stone in the gallbladder. No wall
thickening. No focal hepatic abnormality. No biliary dilatation

Pancreas: Unremarkable. No pancreatic ductal dilatation or
surrounding inflammatory changes.

Spleen: Normal in size without focal abnormality.

Adrenals/Urinary Tract: Adrenal glands within normal limits. 4 mm
stone in the mid left kidney. No left hydronephrosis. Moderate right
hydronephrosis with moderate severe dilatation of right extrarenal
pelvis and proximal right ureter. No definite calcified stones seen
along the course of the right ureter. The bladder contains air.
Punctate lobe right pelvic calcification appears separate from the
ureter.

Stomach/Bowel: Stomach is within normal limits. Appendix appears
normal. No evidence of bowel wall thickening, distention, or
inflammatory changes.

Vascular/Lymphatic: No significant vascular findings are present. No
enlarged abdominal or pelvic lymph nodes.

Reproductive: Enlarged uterus. Hemorrhagic material and hypoechoic
fluid within the endometrium, lower uterine segment and cervix. No
adnexal mass.

Other: No free air or free fluid.

Musculoskeletal: No acute or significant osseous findings.
IMPRESSION: 1. There is moderate severe hydronephrosis of the right kidney with
enlarged right extrarenal pelvis and hydroureter. No definite
ureteral stone is visualized.
2. Nonobstructing stone in the mid left kidney. Air within the
bladder is likely due to recent instrumentation.
3. Enlarged uterus containing hemorrhagic material within the lower
uterine segment and cervix, consistent with recent postpartum
status.

## 2017-08-27 ENCOUNTER — Other Ambulatory Visit: Payer: Self-pay

## 2017-12-05 ENCOUNTER — Emergency Department (HOSPITAL_COMMUNITY)
Admission: EM | Admit: 2017-12-05 | Discharge: 2017-12-06 | Disposition: A | Discharge status: Home / Self Care | Payer: PRIVATE HEALTH INSURANCE | Attending: Emergency Medicine | Admitting: Emergency Medicine

## 2017-12-05 ENCOUNTER — Other Ambulatory Visit: Payer: Self-pay

## 2017-12-05 ENCOUNTER — Encounter (HOSPITAL_COMMUNITY): Payer: Self-pay | Admitting: Emergency Medicine

## 2017-12-05 DIAGNOSIS — Z91010 Allergy to peanuts: Secondary | ICD-10-CM | POA: Insufficient documentation

## 2017-12-05 DIAGNOSIS — Z79899 Other long term (current) drug therapy: Secondary | ICD-10-CM | POA: Insufficient documentation

## 2017-12-05 DIAGNOSIS — F17210 Nicotine dependence, cigarettes, uncomplicated: Secondary | ICD-10-CM | POA: Insufficient documentation

## 2017-12-05 DIAGNOSIS — Z91040 Latex allergy status: Secondary | ICD-10-CM | POA: Insufficient documentation

## 2017-12-05 DIAGNOSIS — L02412 Cutaneous abscess of left axilla: Secondary | ICD-10-CM | POA: Insufficient documentation

## 2017-12-05 MED ORDER — OXYCODONE-ACETAMINOPHEN 5-325 MG PO TABS
1.0000 | ORAL_TABLET | ORAL | Status: DC | PRN
  Administered 2017-12-05: 1 via ORAL
  Filled 2017-12-05: qty 1

## 2017-12-05 NOTE — ED Triage Notes (Signed)
Pt reports left armpit abscess that started about 3 weeks ago. Pt reports pain has gotten increasingly worse over the last week. Pain 10/10. Pt reports nausea. Pt reports 3 knots about the size of a quarter. Pt reports they are warm to touch and redness to area. Pt reports leaking yellow drainage about 3 days ago. Pt tearful in triage.

## 2017-12-06 MED ORDER — LIDOCAINE-EPINEPHRINE (PF) 2 %-1:200000 IJ SOLN: 20.0000 mL | Freq: Once | INTRAMUSCULAR | Status: DC

## 2017-12-06 MED ORDER — CLINDAMYCIN HCL 300 MG PO CAPS: 300.0000 mg | ORAL_CAPSULE | Freq: Three times a day (TID) | ORAL | 0 refills | Status: AC

## 2017-12-06 MED ORDER — IBUPROFEN 800 MG PO TABS: 800.0000 mg | ORAL_TABLET | Freq: Three times a day (TID) | ORAL | 0 refills | Status: AC

## 2017-12-06 MED ORDER — OXYCODONE-ACETAMINOPHEN 5-325 MG PO TABS
1.0000 | ORAL_TABLET | Freq: Once | ORAL | Status: AC
  Administered 2017-12-06: 1 via ORAL
  Filled 2017-12-06: qty 1

## 2017-12-06 NOTE — ED Notes (Signed)
Pt understood dc material. NAD noted. Script and work excuse given at dc 

## 2017-12-06 NOTE — ED Provider Notes (Signed)
[facility] EMERGENCY DEPARTMENT Provider Note  CSN: [id number] Arrival date & time: 12/05/17  2255    History   Chief Complaint No chief complaint on file.  HPI Robyn Williams is a 32 y.o. female with a medical history of anemia and nephrolithiasis who presented to the ED for axillary abscess. Patient states she has had 3 or 4 boils underneath her left armpit for 3 weeks, but the pain has become significantly worse over the last 2 days.The pain is 10/10 constantly and she has been unable to use her left arm. Associated symptoms: warmth, erythema and yellow drainage from site. Denies fever, chills, diaphoresis or other skin rashes/lesions. Patient has tried warm compresses prior to coming to the ED.   Past Medical History:  Diagnosis Date  . Anemia   . Asthma    with pregnancy  . Chronic kidney disease    kidney stones  . Depression    doing good  . Fracture, foot   . History of blood transfusion    received after 1st pregnancy  . Hx of postpartum hemorrhage, currently pregnant    with last pregnancy, 1 week after discharge, told d/t retained placenta  . Urinary tract infection     Patient Active Problem List   Diagnosis Date Noted  . Back pain affecting pregnancy in third trimester   . History of kidney stones   . Other hydronephrosis   . Prolonged latent phase of labor 10/31/2016  . Indication for care in labor and delivery, antepartum 10/30/2016  . Labor and delivery indication for care or intervention 10/29/2016  . Labor and delivery, indication for care 10/20/2016  . Late prenatal care affecting pregnancy in third trimester 09/14/2016    Past Surgical History:  Procedure Laterality Date  . NO PAST SURGERIES       OB History    Gravida  6   Para  5   Term  5   Preterm  0   AB  1   Living  5     SAB  1   TAB  0   Ectopic  0   Multiple  0   Live Births  5            Home Medications    Prior to Admission medications    Medication Sig Start Date End Date Taking? Authorizing Provider  clindamycin (CLEOCIN) 300 MG capsule Take 1 capsule (300 mg total) by mouth 3 (three) times daily for 7 days. 12/06/17 12/13/17  Jerusalen Mateja, Jerrel Ivory I, PA-C  ferrous sulfate 325 (65 FE) MG tablet Take 1 tablet (325 mg total) by mouth 3 (three) times daily with meals. 11/02/16   Lawhorn, Vanessa [place], CNM  ibuprofen (ADVIL,MOTRIN) 800 MG tablet Take 1 tablet (800 mg total) by mouth 3 (three) times daily for 14 days. 12/06/17 12/20/17  Cheyenna Pankowski, Jerrel Ivory I, PA-C  tamsulosin (FLOMAX) 0.4 MG CAPS capsule Take 1 capsule (0.4 mg total) by mouth daily. 11/02/16   Lawhorn, Vanessa , CNM    Family History Family History  Problem Relation Age of Onset  . Hypertension Mother   . Cancer Paternal Grandmother   . Anesthesia problems Neg Hx   . Hearing loss Neg Hx   . Asthma Neg Hx   . Diabetes Neg Hx     Social History Social History   Tobacco Use  . Smoking status: Current Some Day Smoker    Years: 0.50    Types: Cigarettes  . Smokeless tobacco:  Never Used  Substance Use Topics  . Alcohol use: No    Comment: Sparingly   . Drug use: No    Types: Marijuana    Comment: last use 3 weeks ago     Allergies   Amoxicillin; Bee venom; Peanut-containing drug products; Phenergan [promethazine hcl]; Zofran [ondansetron hcl]; and Latex   Review of Systems Review of Systems  Constitutional: Negative for chills, diaphoresis and fever.  Respiratory: Negative for chest tightness.   Cardiovascular: Negative for chest pain and palpitations.  Musculoskeletal: Positive for arthralgias and myalgias. Negative for joint swelling.  Skin:       4 "knots" under left armpit     Physical Exam Updated Vital Signs BP 101/82   Pulse 87   Temp 98.7 F (37.1 C) (Oral)   Resp 18   Ht 5\' 5"  (1.651 m)   Wt 77.1 kg (170 lb)   LMP 11/21/2017   SpO2 99%   BMI 28.29 kg/m   Physical Exam  Constitutional: She appears well-developed and  well-nourished.  Pt is tearful throughout exam and holding her left arm close to her body.  Cardiovascular: Normal rate, regular rhythm, normal heart sounds and intact distal pulses.  Pulmonary/Chest: Effort normal and breath sounds normal.  Musculoskeletal:       Right shoulder: Normal.       Left shoulder: She exhibits decreased range of motion and tenderness. She exhibits no swelling and no deformity.       Left elbow: She exhibits decreased range of motion. She exhibits no deformity. No tenderness found.  Left shoulder exquisitely tender to light palpation. Unable to perform full active or passive ROM due to pain and pt refusal. No joint edema, warmth or erythema.  Lymphadenopathy:    She has no cervical adenopathy.  Skin: Skin is warm and intact. She is not diaphoretic. No erythema.  Unable to visualize full abscess and pt refused to participate in exam due to pain. Was able to palpate indurated area of medial left axilla.   Nursing note and vitals reviewed.    ED Treatments / Results  Labs (all labs ordered are listed, but only abnormal results are displayed) Labs Reviewed - No data to display  EKG None  Radiology No results found.  Procedures Procedures (including critical care time)  Medications Ordered in ED Medications  oxyCODONE-acetaminophen (PERCOCET/ROXICET) 5-325 MG per tablet 1 tablet (1 tablet Oral Given 12/06/17 0023)     Initial Impression / Assessment and Plan / ED Course  Triage vital signs and the nursing notes have been reviewed.  Pertinent labs & imaging results that were available during care of the patient were reviewed and considered in medical decision making (see chart for details). Clinical Course as of Dec 06 49  Mon Dec 06, 2017  0025 Percocet x1 given in the ED for pain.   [GM]    Clinical Course User Index [GM] Vannia Pola, Sharyon MedicusGabrielle I, PA-C   Patient presents in pain due to an axillary abscess. Physical exam was limited because patient  was in pain and eventually refused to let this provider examine fully. Given exquisite tenderness of left shoulder and axillary region to very light palpation, cellulitis is likely. Decreased concern for septic arthritis, rheumatologic process or widespread infection as patient denies systemic s/s and no joint abnormalities (edema, erythema, warmth, deformity)  Treatment options were discussed with patient. Patient requests to be put under anesthesia for I&D. When informed that that will not occur in the ED, patient refuses  I&D and further physical evaluation in the ED.  Final Clinical Impressions(s) / ED Diagnoses  1. Axillary Abscess. Unable to accurately assess the necessity of I&D to pt's pain. Pt refused I&D and stated that she wanted to be put under anesthesia for procedure. Education provided on treatment options. Will treat for cellulitis with Clindamycin and prescribed ibuprofen for pain. Referral to [facility] for patient to discuss treatment options.  Dispo: Home. After thorough clinical evaluation, this patient is determined to be medically stable and can be safely discharged with the previously mentioned treatment and/or outpatient follow-up/referral(s). At this time, there are no other apparent medical conditions that require further screening, evaluation or treatment.   Final diagnoses:  Abscess of axilla, left    ED Discharge Orders        Ordered    ibuprofen (ADVIL,MOTRIN) 800 MG tablet  3 times daily     12/06/17 0023    clindamycin (CLEOCIN) 300 MG capsule  3 times daily     12/06/17 0023        Jayten Gabbard, [place] I, PA-C 12/06/17 0981    Margarita Grizzle, MD 12/06/17 1401

## 2017-12-06 NOTE — Discharge Instructions (Addendum)
[facility] to discuss treatment options for your abscess.  Take full course of antibiotics.  You may continue to use ibuprofen and warm compresses for pain relief.

## 2017-12-07 ENCOUNTER — Emergency Department (HOSPITAL_COMMUNITY)
Admission: EM | Admit: 2017-12-07 | Discharge: 2017-12-08 | Disposition: A | Discharge status: Home / Self Care | Payer: PRIVATE HEALTH INSURANCE | Attending: Emergency Medicine | Admitting: Emergency Medicine

## 2017-12-07 ENCOUNTER — Encounter (HOSPITAL_COMMUNITY): Payer: Self-pay

## 2017-12-07 DIAGNOSIS — L02412 Cutaneous abscess of left axilla: Secondary | ICD-10-CM | POA: Insufficient documentation

## 2017-12-07 DIAGNOSIS — Z5321 Procedure and treatment not carried out due to patient leaving prior to being seen by health care provider: Secondary | ICD-10-CM | POA: Insufficient documentation

## 2017-12-07 NOTE — ED Triage Notes (Signed)
Pt presents again for abscess to L axilla.  Pt reports she was here 2 days ago, pt reports she is scared to have them I&D'd, is trying to get in touch with surgeon to have them opened.  Pt reports she was given ibuprofen and abx but reports pain is worse.

## 2017-12-08 DIAGNOSIS — L02419 Cutaneous abscess of limb, unspecified: Secondary | ICD-10-CM | POA: Insufficient documentation

## 2017-12-08 DIAGNOSIS — L732 Hidradenitis suppurativa: Secondary | ICD-10-CM | POA: Insufficient documentation

## 2017-12-08 NOTE — ED Notes (Signed)
Called pt 3x with no answer. 

## 2017-12-08 NOTE — ED Notes (Signed)
Writer call for reassess vitals, no response.  

## 2019-01-04 IMAGING — US US RENAL
1 series · 14 of 25 positions shown · non-contrast
Comparison: Ultrasound June 19, 2009.

CLINICAL DATA: Back pain.

EXAM:
RENAL / URINARY TRACT ULTRASOUND COMPLETE

[Series 1: us renal · 0.26mm/px · 14 of 44 slices shown]
[im 1/44]
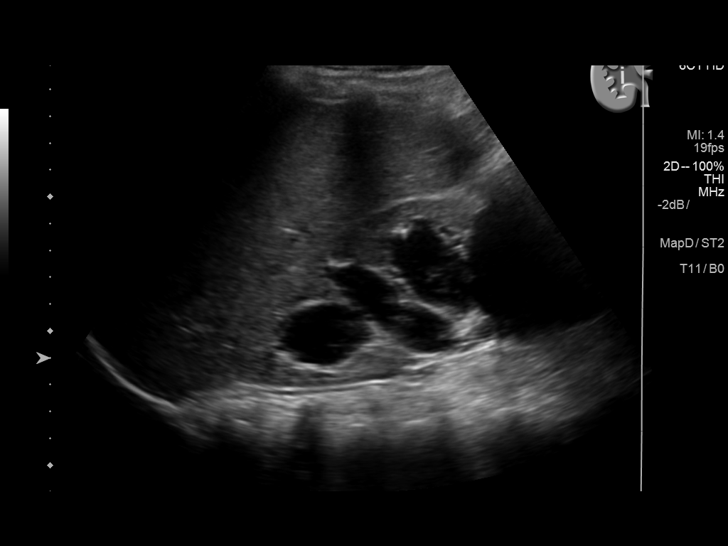
[im 4/44]
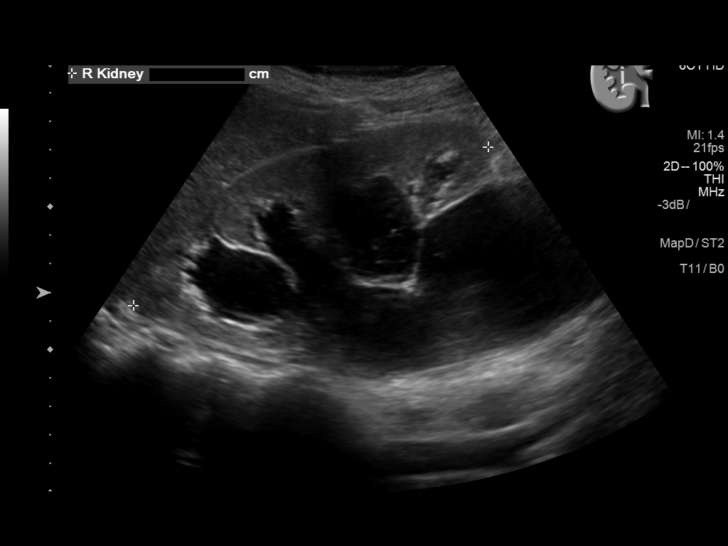
[im 8/44]
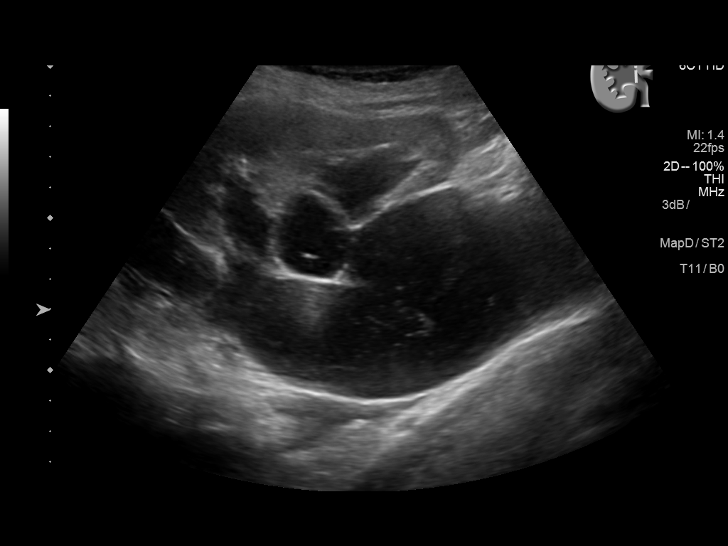
[im 11/44]
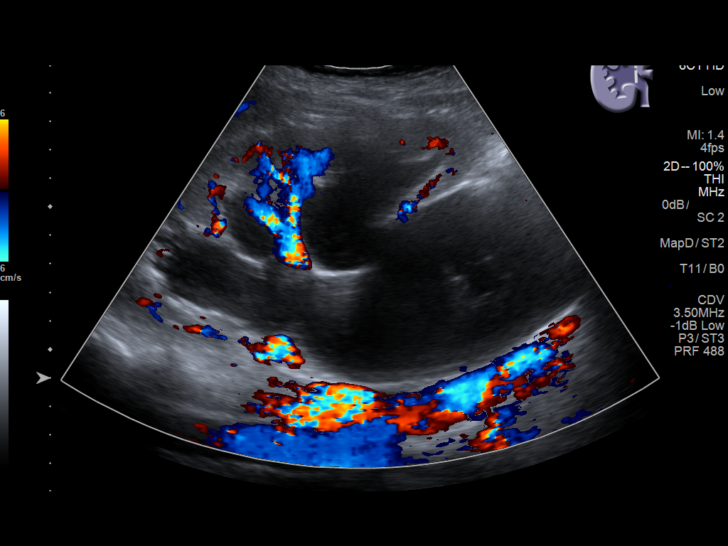
[im 15/44]
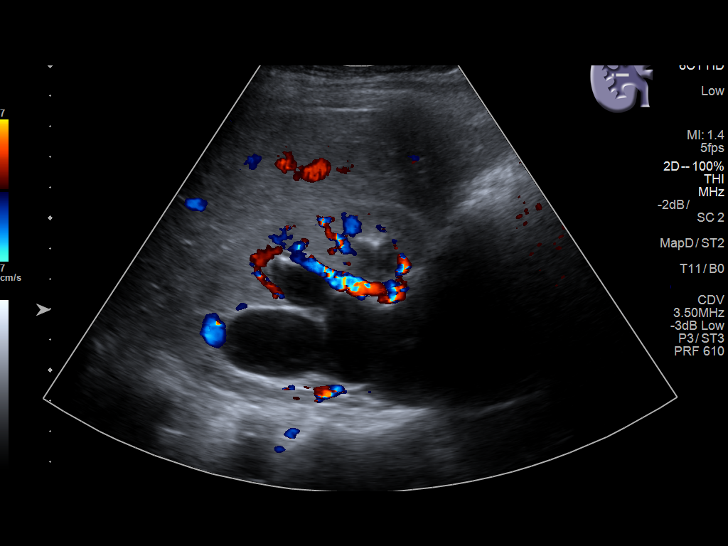
[im 17/44]
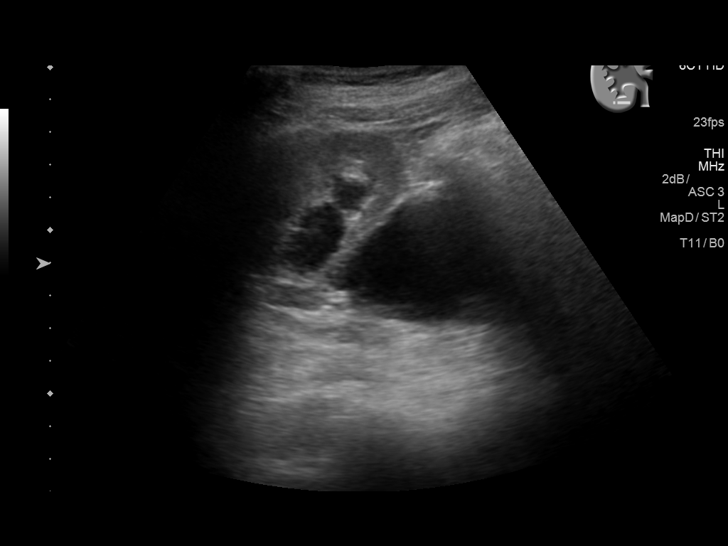
[im 20/44]
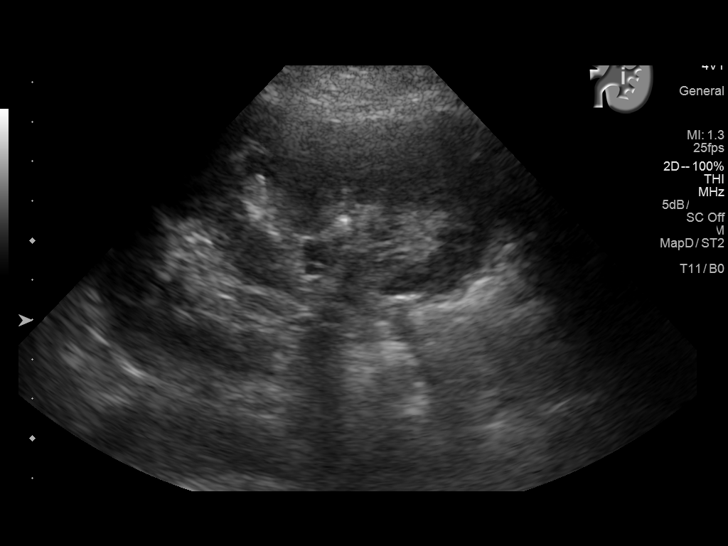
[im 24/44]
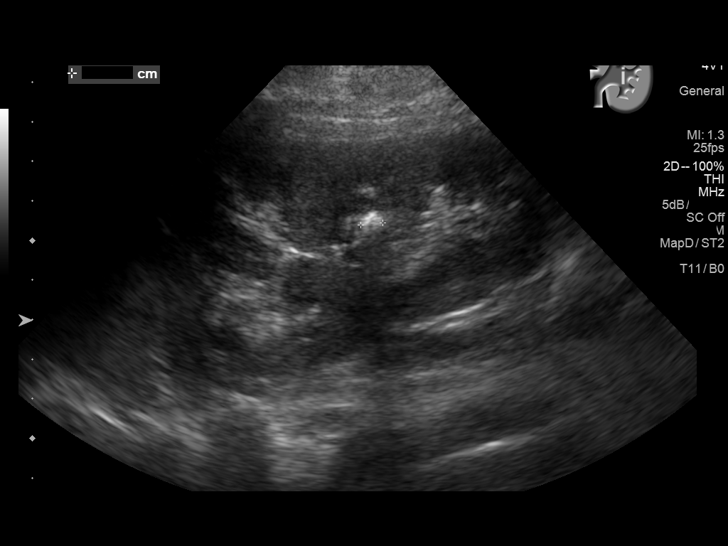
[im 27/44]
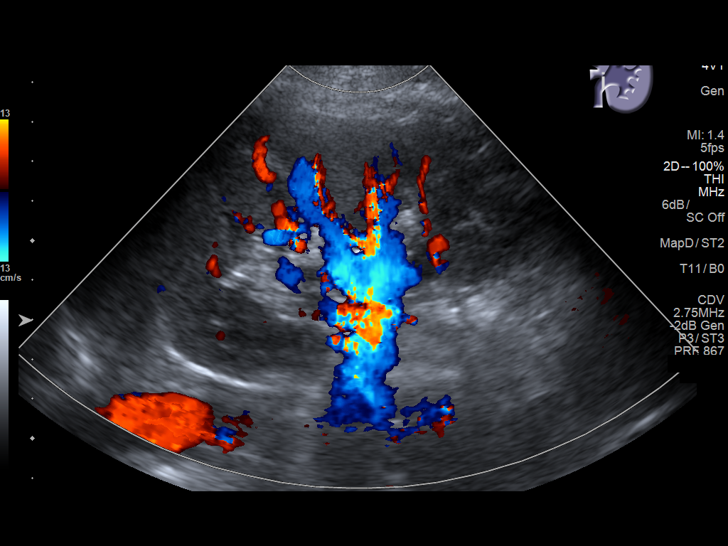
[im 29/44]
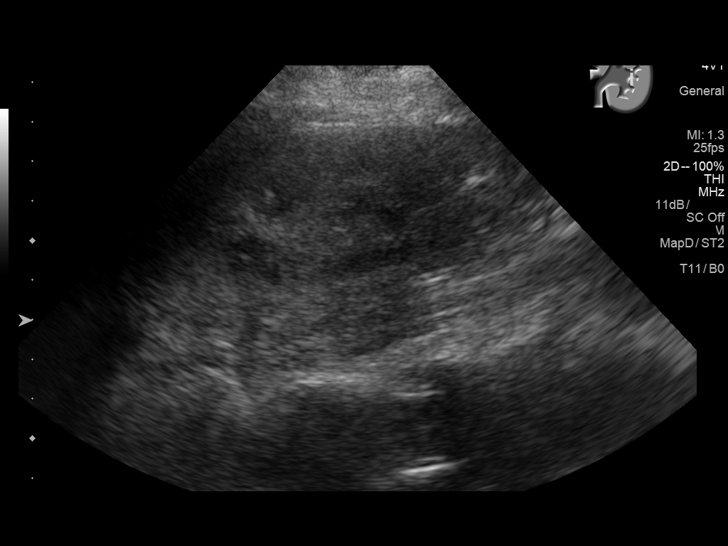
[im 33/44]
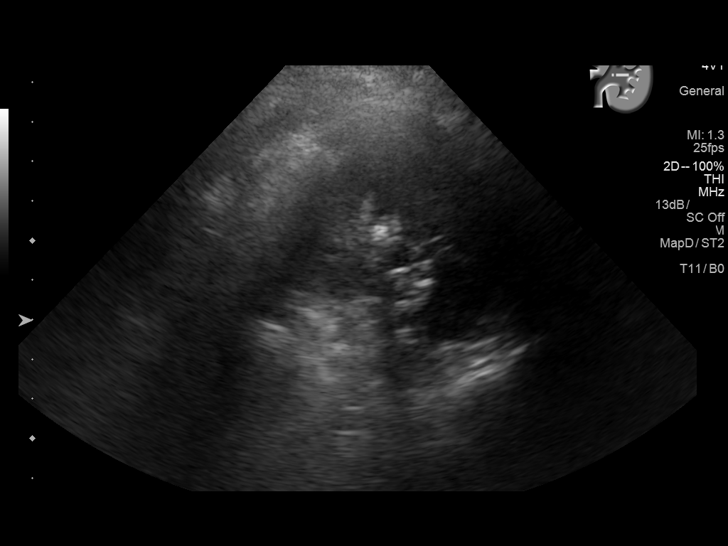
[im 36/44]
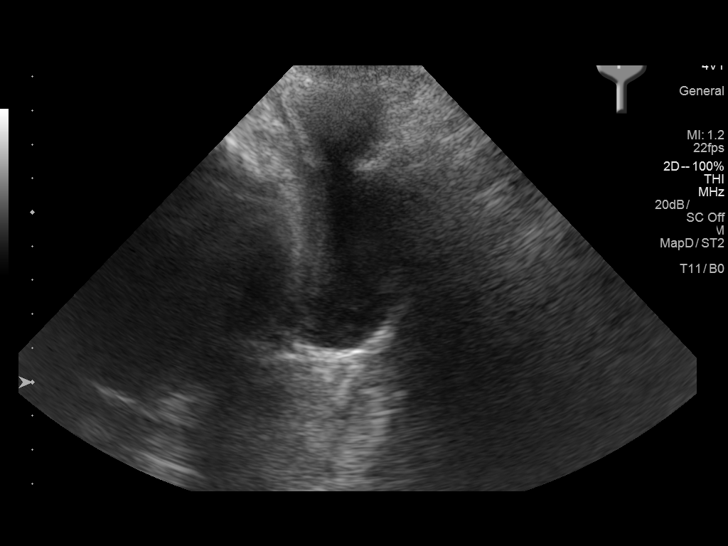
[im 40/44]
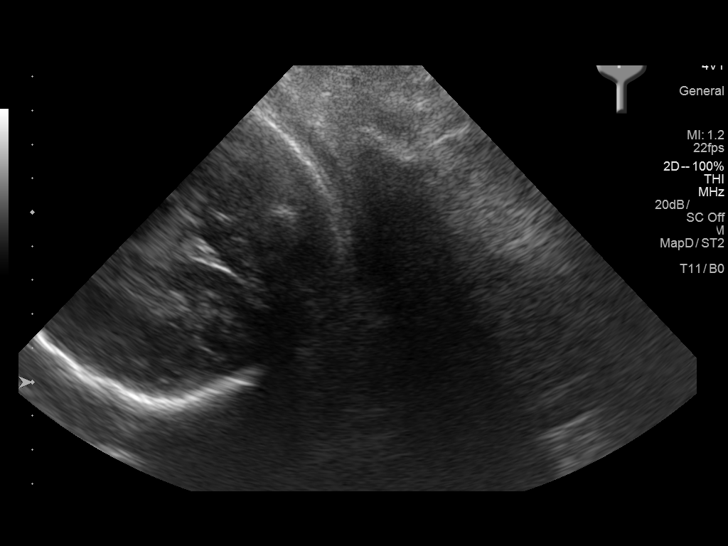
[im 44/44]
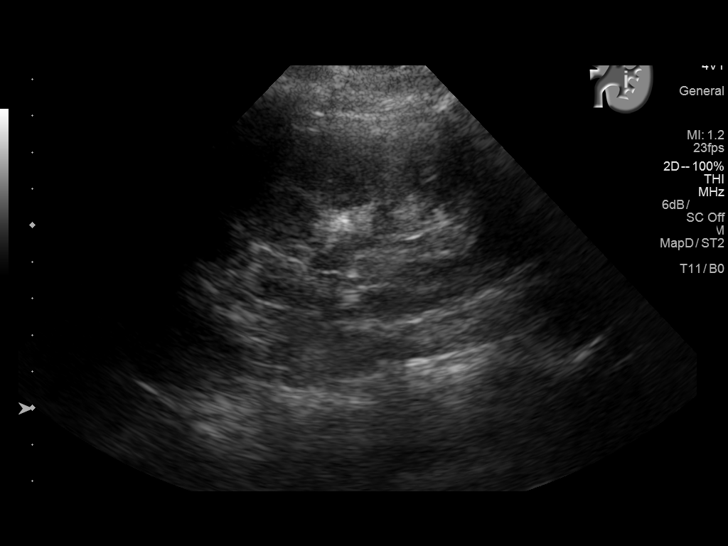

[14 of 25 positions shown; findings below may reference images not displayed]

FINDINGS: Right Kidney:

Length: 13.6 cm. Echogenicity within normal limits. No mass
visualized. Severe hydronephrosis and proximal ureteral dilatation
is noted.

Left Kidney:

Length: 11.8 cm. 6 mm calculus is noted in midpole. Echogenicity
within normal limits. No mass or hydronephrosis visualized.

Bladder:

Appears normal for degree of bladder distention. Left ureteral jet
is visualized. Right ureteral jet is not visualized.
IMPRESSION: Nonobstructive left renal calculus. Severe right hydronephrosis is
noted concerning for distal ureteral obstruction.

## 2019-03-31 ENCOUNTER — Telehealth: Payer: Self-pay

## 2019-03-31 ENCOUNTER — Encounter: Payer: Medicaid Other | Admitting: Certified Nurse Midwife

## 2019-03-31 NOTE — Telephone Encounter (Signed)
Patient was on JML schedule for OB physical and would arrive at our office >15 minutes.  Patient was asked to reschedule and refused.  I called and spoke with patient to attempt to reschedule her appointment and patient refused again.  Patient aware if she changes her mind and wants to receive prenatal care we will gladly reschedule her and she verbalized understanding.

## 2019-04-21 LAB — HM PAP SMEAR: HM Pap smear: NORMAL

## 2019-05-09 ENCOUNTER — Telehealth: Payer: Self-pay | Admitting: General Practice

## 2019-05-09 ENCOUNTER — Telehealth: Payer: Self-pay | Admitting: *Deleted

## 2019-05-09 NOTE — Telephone Encounter (Signed)
There is currently no referral in [employer] or Epic that matches this patient.  Spoke with Janett Billow at [facility]. We show pt has Medicaid and at this time we are not accepting any New Patients on Medicaid. She advised they show patient has BCBS and provided an ID number. I advised her I would research this for eligibility and see if we could schedule the patient. Per [employer] the patient does have an eligible BCBS plan. This has been printed and scanned into the pt's chart.   Spoke with patient and she states she does not wish to establish care at this time. She will call once she has delivered her baby.   Nothing further needed at this time.

## 2019-05-09 NOTE — Telephone Encounter (Signed)
Copied from [place] [id number]. Topic: General - Inquiry >> May 09, 2019  8:51 AM Mathis Bud wrote: Reason for CRM: Janett Billow from [facility] called checking status on patient referral to be sent at our office. [address]

## 2019-05-09 NOTE — Telephone Encounter (Signed)
Attempted to call [facility], to advise  them this patient has not been seen at the practice .Left message on the voicemail for the referral department .

## 2019-05-09 NOTE — Telephone Encounter (Signed)
Entered chart multiple times trying to enter insurance to file.

## 2019-06-30 NOTE — L&D Delivery Note (Signed)
Delivery Note She required pitocin augmentation, eventually entered active labor, progressed to complete and pushed well.  At 10:44 PM a viable female was delivered via Vaginal, Spontaneous (Presentation: Left Occiput Anterior).  APGAR: 8, 9; weight pending.   Placenta status: Spontaneous, Intact.  Cord: 3 vessels with the following complications: nuchal x1 reduced.   Anesthesia: Epidural Episiotomy: None Lacerations: None Suture Repair: none Est. Blood Loss (mL): 264  Mom to postpartum.  Baby to Couplet care / Skin to Skin.  She wants baby circumcised.  She wants BTL, but per our office chart needs to do as an interval procedure.  Robyn Williams 10/20/2019, 11:05 PM

## 2019-09-17 NOTE — Progress Notes (Signed)
Cardiology Office Note:   Date:  09/18/2019  NAME:  Robyn Williams    MRN: [id number] DOB:  [birth year]   PCP:  Patient, No Pcp Per  Cardiologist:  No primary care provider on file.   Referring MD: Lavina Hamman, MD   Chief Complaint  Patient presents with  . Loss of Consciousness   History of Present Illness:   Robyn Williams is a 34 y.o. female with a hx of anemia, pregnancy who is being seen today for the evaluation of palpitations/syncope at the request of Meisinger, Todd, MD.  For the past 1 year she had episodes of dizziness and lightheadedness upon standing.  She reports when standing for too long or standing too quickly she can get hot and sweaty sensation that comes over her body.  She has passed out several times.  Apparently symptoms have gotten worse in pregnancy.  She reports she gets daily episodes of lightheadedness upon standing.  She reports when walking in the grocery store sometimes she is unable to complete shopping due to lightheadedness and dizziness.  She also endorses walking up a flight of steps and then passing out.  She appears to come to relatively quickly after the events.  She describes no incontinence or seizure-like activity.  She also reports intermittent episodes of sharp pain in her bilateral breast.  She reports she does have high anxiety and stress.  She reports she is only drinking 5 to 6 glasses of water per day.  She has been prone to the passing out spells in the past and everything is worse with pregnancy.  She also feels that her heart is racing at times.  She has chronic anemia and her most recent hemoglobin was 11.4 by her obstetrician.  It appears she is on iron supplementation.  This is a bit low for a woman who is [redacted] weeks pregnant my opinion.  Her EKG today demonstrates sinus tachycardia with a heart rate of 120.  There are no acute ST-T changes.  She has no evidence of murmurs or cardiovascular abnormalities on my examination.  Overall  she is quite concerned about the alarming rate of her symptoms.  I counseled her extensively that her symptoms are normal and can be better treated with hydration as well as treatment of her anemia.  Past Medical History: Past Medical History:  Diagnosis Date  . Anemia   . Asthma    with pregnancy  . Chronic kidney disease    kidney stones  . Depression    doing good  . Fracture, foot   . History of blood transfusion    received after 1st pregnancy  . Hx of postpartum hemorrhage, currently pregnant    with last pregnancy, 1 week after discharge, told d/t retained placenta  . Urinary tract infection     Past Surgical History: Past Surgical History:  Procedure Laterality Date  . arm surgery    . NO PAST SURGERIES      Current Medications: Current Meds  Medication Sig  . albuterol (VENTOLIN HFA) 108 (90 Base) MCG/ACT inhaler Inhale 2 puffs into the lungs every 6 (six) hours as needed.  . clindamycin (CLINDAGEL) 1 % gel Apply 1 application topically 2 (two) times daily.  . ferrous sulfate 325 (65 FE) MG tablet Take 1 tablet (325 mg total) by mouth 3 (three) times daily with meals.  Marland Kitchen FLUoxetine (PROZAC) 40 MG capsule Take 40 mg by mouth daily.     Allergies:    Peanut oil,  Amoxicillin er, Ondansetron, Phenergan  [promethazine], Amoxicillin, Bee venom, Peanut-containing drug products, Phenergan [promethazine hcl], Zofran [ondansetron hcl], and Latex   Social History: Social History   Socioeconomic History  . Marital status: Married    Spouse name: Not on file  . Number of children: 5  . Years of education: Not on file  . Highest education level: Not on file  Occupational History  . Not on file  Tobacco Use  . Smoking status: Former Smoker    Years: 0.50    Types: Cigarettes  . Smokeless tobacco: Never Used  Substance and Sexual Activity  . Alcohol use: No    Comment: Sparingly   . Drug use: No    Comment: last use 3 weeks ago  . Sexual activity: Yes    Birth  control/protection: None  Other Topics Concern  . Not on file  Social History Narrative  . Not on file   Social Determinants of Health   Financial Resource Strain:   . Difficulty of Paying Living Expenses:   Food Insecurity:   . Worried About Programme researcher, broadcasting/film/video in the Last Year:   . Barista in the Last Year:   Transportation Needs:   . Freight forwarder (Medical):   Marland Kitchen Lack of Transportation (Non-Medical):   Physical Activity:   . Days of Exercise per Week:   . Minutes of Exercise per Session:   Stress:   . Feeling of Stress :   Social Connections:   . Frequency of Communication with Friends and Family:   . Frequency of Social Gatherings with Friends and Family:   . Attends Religious Services:   . Active Member of Clubs or Organizations:   . Attends Banker Meetings:   Marland Kitchen Marital Status:      Family History: The patient's family history includes Arrhythmia in her mother; Cancer in her paternal grandmother; Hypertension in her mother. There is no history of Anesthesia problems, Hearing loss, Asthma, or Diabetes.  ROS:   All other ROS reviewed and negative. Pertinent positives noted in the HPI.     EKGs/Labs/Other Studies Reviewed:   The following studies were personally reviewed by me today:  EKG:  EKG is ordered today.  The ekg ordered today demonstrates Sinus tachycardia, HR 120, No acute ST T changes, and was personally reviewed by me.   Recent Labs: No results found for requested labs within last 8760 hours.   Recent Lipid Panel No results found for: CHOL, TRIG, HDL, CHOLHDL, VLDL, LDLCALC, LDLDIRECT  Physical Exam:   VS:  BP 90/66   Pulse (!) 120   Ht 5\' 5"  (1.651 m)   Wt 192 lb 9.6 oz (87.4 kg)   LMP  (LMP Unknown)   SpO2 96%   Breastfeeding Unknown   BMI 32.05 kg/m    Wt Readings from Last 3 Encounters:  09/18/19 192 lb 9.6 oz (87.4 kg)  10/29/16 174 lb (78.9 kg)  10/26/16 174 lb (78.9 kg)    General: Well nourished, well  developed, in no acute distress Heart: Atraumatic, normal size  Eyes: PEERLA, EOMI  Neck: Supple, no JVD Endocrine: No thryomegaly Cardiac: tachycardia noted on exam, no murmurs rubs or gallops Lungs: Clear to auscultation bilaterally, no wheezing, rhonchi or rales  Abd: Soft, nontender, no hepatomegaly  Ext: No edema, pulses 2+ Musculoskeletal: No deformities, BUE and BLE strength normal and equal Skin: Warm and dry, no rashes   Neuro: Alert and oriented to person, place, time,  and situation, CNII-XII grossly intact, no focal deficits  Psych: Normal mood and affect   ASSESSMENT:   Robyn Williams is a 34 y.o. female who presents for the following: 1. Palpitations   2. Dizziness   3. Syncope and collapse   4. Vasovagal syncope     PLAN:   1. Palpitations 2. Dizziness 3. Syncope and collapse 4. Vasovagal syncope -She presents with clear classic symptoms of vasovagal syncope.  She had these episodes prior to pregnancy and they are worsened by pregnancy.  She is not drinking nearly enough water.  She reports 5 to 6 glasses/day.  I have told her to double it up to 10 to 12 glasses of water per day.  She is also chronically anemic which could explain her sinus tachycardia.  We will recheck TSH, CBC and iron profile today.  I have concerns that her hemoglobin is not sufficient at a level of 11.4 given she is [redacted] weeks pregnant.  We will send this information back to her obstetrician for further evaluation.  Her EKG today demonstrates sinus tachycardia without any acute ischemic changes.  I hear no murmurs on examination.  We will check an echocardiogram to further reassure her that her heart is normal.  I have very low suspicion for any sinister cardiac pathology here.  This is just benign vasovagal syncope that is worsened by pregnancy, dehydration and iron deficiency anemia.  Disposition: Return if symptoms worsen or fail to improve.  Medication Adjustments/Labs and Tests  Ordered: Current medicines are reviewed at length with the patient today.  Concerns regarding medicines are outlined above.  Orders Placed This Encounter  Procedures  . TSH  . CBC  . Iron, TIBC and Ferritin Panel  . EKG 12-Lead  . ECHOCARDIOGRAM COMPLETE   No orders of the defined types were placed in this encounter.   Patient Instructions  Medication Instructions:  The current medical regimen is effective;  continue present plan and medications.  *If you need a refill on your cardiac medications before your next appointment, please call your pharmacy*   Lab Work: TSH, CBC, IRON PROFILE  If you have labs (blood work) drawn today and your tests are completely normal, you will receive your results only by: Marland Kitchen MyChart Message (if you have MyChart) OR . A paper copy in the mail If you have any lab test that is abnormal or we need to change your treatment, we will call you to review the results.   Testing/Procedures: Echocardiogram - Your physician has requested that you have an echocardiogram. Echocardiography is a painless test that uses sound waves to create images of your heart. It provides your doctor with information about the size and shape of your heart and how well your heart's chambers and valves are working. This procedure takes approximately one hour. There are no restrictions for this procedure. This will be performed at our [facility] location - [address], [address].    Follow-Up: At [facility], you and your health needs are our priority.  As part of our continuing mission to provide you with exceptional heart care, we have created designated Provider Care Teams.  These Care Teams include your primary Cardiologist (physician) and Advanced Practice Providers (APPs -  Physician Assistants and Nurse Practitioners) who all work together to provide you with the care you need, when you need it.  We recommend signing up for the patient portal called "MyChart".  Sign up  information is provided on this After  Visit Summary.  MyChart is used to connect with patients for Virtual Visits (Telemedicine).  Patients are able to view lab/test results, encounter notes, upcoming appointments, etc.  Non-urgent messages can be sent to your provider as well.   To learn more about what you can do with MyChart, go to [email or web address].    Your next appointment:   As needed  The format for your next appointment:   Either In Person or Virtual  Provider:   Lennie Odor, MD        Signed, Lenna Gilford. Flora Lipps, MD [facility]  [address] [place], [place] [address] [phone]  09/18/2019 4:42 PM

## 2019-09-18 ENCOUNTER — Encounter: Payer: Self-pay | Admitting: Cardiovascular Disease

## 2019-09-18 ENCOUNTER — Ambulatory Visit (INDEPENDENT_AMBULATORY_CARE_PROVIDER_SITE_OTHER): Payer: BC Managed Care – PPO | Admitting: Cardiovascular Disease

## 2019-09-18 ENCOUNTER — Other Ambulatory Visit: Payer: Self-pay

## 2019-09-18 VITALS — BP 90/66 | HR 120 | Ht 65.0 in | Wt 192.6 lb

## 2019-09-18 DIAGNOSIS — R42 Dizziness and giddiness: Secondary | ICD-10-CM | POA: Diagnosis not present

## 2019-09-18 DIAGNOSIS — R55 Syncope and collapse: Secondary | ICD-10-CM | POA: Diagnosis not present

## 2019-09-18 DIAGNOSIS — R002 Palpitations: Secondary | ICD-10-CM

## 2019-09-18 NOTE — Patient Instructions (Signed)
Medication Instructions:  The current medical regimen is effective;  continue present plan and medications.  *If you need a refill on your cardiac medications before your next appointment, please call your pharmacy*   Lab Work: TSH, CBC, IRON PROFILE  If you have labs (blood work) drawn today and your tests are completely normal, you will receive your results only by: Marland Kitchen MyChart Message (if you have MyChart) OR . A paper copy in the mail If you have any lab test that is abnormal or we need to change your treatment, we will call you to review the results.   Testing/Procedures: Echocardiogram - Your physician has requested that you have an echocardiogram. Echocardiography is a painless test that uses sound waves to create images of your heart. It provides your doctor with information about the size and shape of your heart and how well your heart's chambers and valves are working. This procedure takes approximately one hour. There are no restrictions for this procedure. This will be performed at our [facility] location - [address], [address].    Follow-Up: At [facility], you and your health needs are our priority.  As part of our continuing mission to provide you with exceptional heart care, we have created designated Provider Care Teams.  These Care Teams include your primary Cardiologist (physician) and Advanced Practice Providers (APPs -  Physician Assistants and Nurse Practitioners) who all work together to provide you with the care you need, when you need it.  We recommend signing up for the patient portal called "MyChart".  Sign up information is provided on this After Visit Summary.  MyChart is used to connect with patients for Virtual Visits (Telemedicine).  Patients are able to view lab/test results, encounter notes, upcoming appointments, etc.  Non-urgent messages can be sent to your provider as well.   To learn more about what you can do with MyChart, go to  [email or web address].    Your next appointment:   As needed  The format for your next appointment:   Either In Person or Virtual  Provider:   Lennie Odor, MD

## 2019-09-19 ENCOUNTER — Telehealth: Payer: Self-pay | Admitting: Cardiovascular Disease

## 2019-09-19 LAB — IRON,TIBC AND FERRITIN PANEL
Ferritin: 17 ng/mL (ref 15–150)
Iron Saturation: 16 % (ref 15–55)
Iron: 81 ug/dL (ref 27–159)
Total Iron Binding Capacity: 503 ug/dL — ABNORMAL HIGH (ref 250–450)
UIBC: 422 ug/dL (ref 131–425)

## 2019-09-19 LAB — CBC
Hematocrit: 31 % — ABNORMAL LOW (ref 34.0–46.6)
Hemoglobin: 10.8 g/dL — ABNORMAL LOW (ref 11.1–15.9)
MCH: 35 pg — ABNORMAL HIGH (ref 26.6–33.0)
MCHC: 34.8 g/dL (ref 31.5–35.7)
MCV: 100 fL — ABNORMAL HIGH (ref 79–97)
Platelets: 228 10*3/uL (ref 150–450)
RBC: 3.09 x10E6/uL — ABNORMAL LOW (ref 3.77–5.28)
RDW: 11.9 % (ref 11.7–15.4)
WBC: 6.8 10*3/uL (ref 3.4–10.8)

## 2019-09-19 LAB — TSH: TSH: 1.15 u[IU]/mL (ref 0.450–4.500)

## 2019-09-19 NOTE — Telephone Encounter (Signed)
Called Robyn Williams. TSH normal. Labs shows she is anemic. I am concerned this explains her tachycardia with dehydration. Will send message to Dr. Newt Minion with Obstetrics. I also asked her to follow with them regarding this.   Gerri Spore T. Flora Lipps, MD [facility]  [address] [place], [place] [address] [phone]  3:08 PM

## 2019-10-04 ENCOUNTER — Other Ambulatory Visit (HOSPITAL_COMMUNITY): Payer: BC Managed Care – PPO

## 2019-10-12 ENCOUNTER — Inpatient Hospital Stay (HOSPITAL_COMMUNITY)
Admission: AD | Admit: 2019-10-12 | Discharge: 2019-10-12 | Disposition: A | Discharge status: Home / Self Care | Payer: BC Managed Care – PPO | Attending: Obstetrics and Gynecology | Admitting: Obstetrics and Gynecology

## 2019-10-12 ENCOUNTER — Encounter (HOSPITAL_COMMUNITY): Payer: Self-pay | Admitting: *Deleted

## 2019-10-12 DIAGNOSIS — O471 False labor at or after 37 completed weeks of gestation: Secondary | ICD-10-CM | POA: Diagnosis present

## 2019-10-12 DIAGNOSIS — Z3A37 37 weeks gestation of pregnancy: Secondary | ICD-10-CM | POA: Insufficient documentation

## 2019-10-12 DIAGNOSIS — O479 False labor, unspecified: Secondary | ICD-10-CM

## 2019-10-12 NOTE — MAU Note (Signed)
Been contracting for the last 4 hours, now every 8-9 min.  Had appt at 1430, was 3 cm, in the past has gotten stuck at 3-4cm, then they have had to break her water.

## 2019-10-16 ENCOUNTER — Encounter (HOSPITAL_COMMUNITY): Payer: Self-pay | Admitting: Obstetrics and Gynecology

## 2019-10-16 ENCOUNTER — Inpatient Hospital Stay (HOSPITAL_COMMUNITY)
Admission: AD | Admit: 2019-10-16 | Discharge: 2019-10-16 | Disposition: A | Discharge status: Home / Self Care | Payer: BC Managed Care – PPO | Attending: Obstetrics and Gynecology | Admitting: Obstetrics and Gynecology

## 2019-10-16 ENCOUNTER — Ambulatory Visit (HOSPITAL_BASED_OUTPATIENT_CLINIC_OR_DEPARTMENT_OTHER): Payer: BC Managed Care – PPO

## 2019-10-16 ENCOUNTER — Other Ambulatory Visit: Payer: Self-pay

## 2019-10-16 DIAGNOSIS — O471 False labor at or after 37 completed weeks of gestation: Secondary | ICD-10-CM | POA: Insufficient documentation

## 2019-10-16 DIAGNOSIS — Z3A37 37 weeks gestation of pregnancy: Secondary | ICD-10-CM | POA: Insufficient documentation

## 2019-10-16 DIAGNOSIS — R002 Palpitations: Secondary | ICD-10-CM | POA: Insufficient documentation

## 2019-10-16 DIAGNOSIS — O479 False labor, unspecified: Secondary | ICD-10-CM

## 2019-10-16 NOTE — Discharge Instructions (Signed)
Fetal Movement Counts Patient Name: ________________________________________________ Patient Due Date: ____________________ What is a fetal movement count?  A fetal movement count is the number of times that you feel your baby move during a certain amount of time. This may also be called a fetal kick count. A fetal movement count is recommended for every pregnant woman. You may be asked to start counting fetal movements as early as week 28 of your pregnancy. Pay attention to when your baby is most active. You may notice your baby's sleep and wake cycles. You may also notice things that make your baby move more. You should do a fetal movement count:  When your baby is normally most active.  At the same time each day. A good time to count movements is while you are resting, after having something to eat and drink. How do I count fetal movements? 1. Find a quiet, comfortable area. Sit, or lie down on your side. 2. Write down the date, the start time and stop time, and the number of movements that you felt between those two times. Take this information with you to your health care visits. 3. Write down your start time when you feel the first movement. 4. Count kicks, flutters, swishes, rolls, and jabs. You should feel at least 10 movements. 5. You may stop counting after you have felt 10 movements, or if you have been counting for 2 hours. Write down the stop time. 6. If you do not feel 10 movements in 2 hours, contact your health care provider for further instructions. Your health care provider may want to do additional tests to assess your baby's well-being. Contact a health care provider if:  You feel fewer than 10 movements in 2 hours.  Your baby is not moving like he or she usually does. Date: ____________ Start time: ____________ Stop time: ____________ Movements: ____________ Date: ____________ Start time: ____________ Stop time: ____________ Movements: ____________ Date: ____________  Start time: ____________ Stop time: ____________ Movements: ____________ Date: ____________ Start time: ____________ Stop time: ____________ Movements: ____________ Date: ____________ Start time: ____________ Stop time: ____________ Movements: ____________ Date: ____________ Start time: ____________ Stop time: ____________ Movements: ____________ Date: ____________ Start time: ____________ Stop time: ____________ Movements: ____________ Date: ____________ Start time: ____________ Stop time: ____________ Movements: ____________ Date: ____________ Start time: ____________ Stop time: ____________ Movements: ____________ This information is not intended to replace advice given to you by your health care provider. Make sure you discuss any questions you have with your health care provider. Document Revised: 02/02/2019 Document Reviewed: 02/02/2019 Elsevier Patient Education  2020 Elsevier Inc.        Signs and Symptoms of Labor Labor is your body's natural process of moving your baby, placenta, and umbilical cord out of your uterus. The process of labor usually starts when your baby is full-term, between 37 and 40 weeks of pregnancy. How will I know when I am close to going into labor? As your body prepares for labor and the birth of your baby, you may notice the following symptoms in the weeks and days before true labor starts:  Having a strong desire to get your home ready to receive your new baby. This is called nesting. Nesting may be a sign that labor is approaching, and it may occur several weeks before birth. Nesting may involve cleaning and organizing your home.  Passing a small amount of thick, bloody mucus out of your vagina (normal bloody show or losing your mucus plug). This may happen more than a   week before labor begins, or it might occur right before labor begins as the opening of the cervix starts to widen (dilate). For some women, the entire mucus plug passes at once. For others,  smaller portions of the mucus plug may gradually pass over several days.  Your baby moving (dropping) lower in your pelvis to get into position for birth (lightening). When this happens, you may feel more pressure on your bladder and pelvic bone and less pressure on your ribs. This may make it easier to breathe. It may also cause you to need to urinate more often and have problems with bowel movements.  Having "practice contractions" (Braxton Hicks contractions) that occur at irregular (unevenly spaced) intervals that are more than 10 minutes apart. This is also called false labor. False labor contractions are common after exercise or sexual activity, and they will stop if you change position, rest, or drink fluids. These contractions are usually mild and do not get stronger over time. They may feel like: ? A backache or back pain. ? Mild cramps, similar to menstrual cramps. ? Tightening or pressure in your abdomen. Other early symptoms that labor may be starting soon include:  Nausea or loss of appetite.  Diarrhea.  Having a sudden burst of energy, or feeling very tired.  Mood changes.  Having trouble sleeping. How will I know when labor has begun? Signs that true labor has begun may include:  Having contractions that come at regular (evenly spaced) intervals and increase in intensity. This may feel like more intense tightening or pressure in your abdomen that moves to your back. ? Contractions may also feel like rhythmic pain in your upper thighs or back that comes and goes at regular intervals. ? For first-time mothers, this change in intensity of contractions often occurs at a more gradual pace. ? Women who have given birth before may notice a more rapid progression of contraction changes.  Having a feeling of pressure in the vaginal area.  Your water breaking (rupture of membranes). This is when the sac of fluid that surrounds your baby breaks. When this happens, you will notice  fluid leaking from your vagina. This may be clear or blood-tinged. Labor usually starts within 24 hours of your water breaking, but it may take longer to begin. ? Some women notice this as a gush of fluid. ? Others notice that their underwear repeatedly becomes damp. Follow these instructions at home:   When labor starts, or if your water breaks, call your health care provider or nurse care line. Based on your situation, they will determine when you should go in for an exam.  When you are in early labor, you may be able to rest and manage symptoms at home. Some strategies to try at home include: ? Breathing and relaxation techniques. ? Taking a warm bath or shower. ? Listening to music. ? Using a heating pad on the lower back for pain. If you are directed to use heat:  Place a towel between your skin and the heat source.  Leave the heat on for 20-30 minutes.  Remove the heat if your skin turns bright red. This is especially important if you are unable to feel pain, heat, or cold. You may have a greater risk of getting burned. Get help right away if:  You have painful, regular contractions that are 5 minutes apart or less.  Labor starts before you are [redacted] weeks along in your pregnancy.  You have a fever.  You have   a headache that does not go away.  You have bright red blood coming from your vagina.  You do not feel your baby moving.  You have a sudden onset of: ? Severe headache with vision problems. ? Nausea, vomiting, or diarrhea. ? Chest pain or shortness of breath. These symptoms may be an emergency. If your health care provider recommends that you go to the hospital or birth center where you plan to deliver, do not drive yourself. Have someone else drive you, or call emergency services (911 in the [place] Summary  Labor is your body's natural process of moving your baby, placenta, and umbilical cord out of your uterus.  The process of labor usually starts when your baby is  full-term, between 37 and 40 weeks of pregnancy.  When labor starts, or if your water breaks, call your health care provider or nurse care line. Based on your situation, they will determine when you should go in for an exam. This information is not intended to replace advice given to you by your health care provider. Make sure you discuss any questions you have with your health care provider. Document Revised: 03/15/2017 Document Reviewed: 11/20/2016 Elsevier Patient Education  2020 Elsevier Inc.  

## 2019-10-16 NOTE — MAU Provider Note (Signed)
S: Patient is here for RN labor evaluation. Strip, vital signs, & chart Reviewed   O:  Vitals:   10/16/19 1700 10/16/19 1705 10/16/19 1750 10/16/19 1752  BP:    104/70  Pulse:    (!) 116  Resp:    16  Temp:      TempSrc:      SpO2: 99% 100% 99%   Weight:      Height:       No results found for this or any previous visit (from the past 24 hour(s)).  Dilation: 3 Effacement (%): Thick Cervical Position: Posterior Station: -3 Presentation: Vertex Exam by:: A. Gagliardo, RN   FHR: 140 bpm, Mod Var, no Decels, 15x15 Accels UC: irregular   A: 1. False labor   2. [redacted] weeks gestation of pregnancy      P:  RN to discharge home in stable condition with return precautions & fetal kick counts  Judeth Horn FNP 7:08 PM

## 2019-10-16 NOTE — MAU Note (Signed)
Robyn Williams is a 33 y.o. at [redacted]w[redacted]d here in MAU reporting: contractions since 0300 this morning. They are about every 5 minutes. No bleeding or LOF. +FM. Was 3 cm on Thursday.   Onset of complaint: today  Pain score: 9/10  Vitals:   10/16/19 1540  BP: 108/63  Pulse: (!) 107  Resp: 18  Temp: 98.2 F (36.8 C)  SpO2: 97%     FHT: +FM  Lab orders placed from triage: none

## 2019-10-20 ENCOUNTER — Other Ambulatory Visit: Payer: Self-pay

## 2019-10-20 ENCOUNTER — Inpatient Hospital Stay (HOSPITAL_COMMUNITY): Payer: BC Managed Care – PPO | Admitting: Anesthesiology

## 2019-10-20 ENCOUNTER — Inpatient Hospital Stay (HOSPITAL_COMMUNITY)
Admission: AD | Admit: 2019-10-20 | Discharge: 2019-10-22 | DRG: 807 | Disposition: A | Discharge status: Home / Self Care | Payer: BC Managed Care – PPO | Attending: Obstetrics and Gynecology | Admitting: Obstetrics and Gynecology

## 2019-10-20 ENCOUNTER — Encounter (HOSPITAL_COMMUNITY): Payer: Self-pay | Admitting: Obstetrics and Gynecology

## 2019-10-20 DIAGNOSIS — O99334 Smoking (tobacco) complicating childbirth: Secondary | ICD-10-CM | POA: Diagnosis present

## 2019-10-20 DIAGNOSIS — Z3A38 38 weeks gestation of pregnancy: Secondary | ICD-10-CM

## 2019-10-20 DIAGNOSIS — Z20822 Contact with and (suspected) exposure to covid-19: Secondary | ICD-10-CM | POA: Diagnosis present

## 2019-10-20 DIAGNOSIS — O26893 Other specified pregnancy related conditions, third trimester: Secondary | ICD-10-CM | POA: Diagnosis present

## 2019-10-20 DIAGNOSIS — F17210 Nicotine dependence, cigarettes, uncomplicated: Secondary | ICD-10-CM | POA: Diagnosis present

## 2019-10-20 LAB — CBC
HCT: 34.9 % — ABNORMAL LOW (ref 36.0–46.0)
Hemoglobin: 11.1 g/dL — ABNORMAL LOW (ref 12.0–15.0)
MCH: 33.7 pg (ref 26.0–34.0)
MCHC: 31.8 g/dL (ref 30.0–36.0)
MCV: 106.1 fL — ABNORMAL HIGH (ref 80.0–100.0)
Platelets: 312 10*3/uL (ref 150–400)
RBC: 3.29 MIL/uL — ABNORMAL LOW (ref 3.87–5.11)
RDW: 12.9 % (ref 11.5–15.5)
WBC: 8.9 10*3/uL (ref 4.0–10.5)
nRBC: 0 % (ref 0.0–0.2)

## 2019-10-20 LAB — ABO/RH: ABO/RH(D): A POS

## 2019-10-20 LAB — TYPE AND SCREEN
ABO/RH(D): A POS
Antibody Screen: NEGATIVE

## 2019-10-20 LAB — RESPIRATORY PANEL BY RT PCR (FLU A&B, COVID)
Influenza A by PCR: NEGATIVE
Influenza B by PCR: NEGATIVE
SARS Coronavirus 2 by RT PCR: NEGATIVE

## 2019-10-20 MED ORDER — PHENYLEPHRINE 40 MCG/ML (10ML) SYRINGE FOR IV PUSH (FOR BLOOD PRESSURE SUPPORT): 80.0000 ug | PREFILLED_SYRINGE | INTRAVENOUS | Status: DC | PRN

## 2019-10-20 MED ORDER — LACTATED RINGERS IV SOLN: 500.0000 mL | INTRAVENOUS | Status: DC | PRN

## 2019-10-20 MED ORDER — DIPHENHYDRAMINE HCL 50 MG/ML IJ SOLN: 12.5000 mg | INTRAMUSCULAR | Status: DC | PRN

## 2019-10-20 MED ORDER — LIDOCAINE HCL (PF) 1 % IJ SOLN: 30.0000 mL | INTRAMUSCULAR | Status: DC | PRN

## 2019-10-20 MED ORDER — SOD CITRATE-CITRIC ACID 500-334 MG/5ML PO SOLN: 30.0000 mL | ORAL | Status: DC | PRN

## 2019-10-20 MED ORDER — OXYTOCIN 40 UNITS IN NORMAL SALINE INFUSION - SIMPLE MED
2.5000 [IU]/h | INTRAVENOUS | Status: DC
  Administered 2019-10-20: 23:00:00 2.5 [IU]/h via INTRAVENOUS
  Filled 2019-10-20: qty 1000

## 2019-10-20 MED ORDER — EPHEDRINE 5 MG/ML INJ: 10.0000 mg | INTRAVENOUS | Status: DC | PRN

## 2019-10-20 MED ORDER — OXYCODONE-ACETAMINOPHEN 5-325 MG PO TABS: 2.0000 | ORAL_TABLET | ORAL | Status: DC | PRN

## 2019-10-20 MED ORDER — OXYTOCIN 40 UNITS IN NORMAL SALINE INFUSION - SIMPLE MED
1.0000 m[IU]/min | INTRAVENOUS | Status: DC
  Administered 2019-10-20: 17:00:00 2 m[IU]/min via INTRAVENOUS

## 2019-10-20 MED ORDER — LACTATED RINGERS IV SOLN
500.0000 mL | Freq: Once | INTRAVENOUS | Status: AC
  Administered 2019-10-20: 15:00:00 500 mL via INTRAVENOUS

## 2019-10-20 MED ORDER — FENTANYL-BUPIVACAINE-NACL 0.5-0.125-0.9 MG/250ML-% EP SOLN
12.0000 mL/h | EPIDURAL | Status: DC | PRN
  Filled 2019-10-20: qty 250

## 2019-10-20 MED ORDER — LACTATED RINGERS IV SOLN: INTRAVENOUS | Status: DC

## 2019-10-20 MED ORDER — TERBUTALINE SULFATE 1 MG/ML IJ SOLN: 0.2500 mg | Freq: Once | INTRAMUSCULAR | Status: DC | PRN

## 2019-10-20 MED ORDER — CALCIUM CARBONATE ANTACID 500 MG PO CHEW
2.0000 | CHEWABLE_TABLET | Freq: Three times a day (TID) | ORAL | Status: DC | PRN
  Administered 2019-10-20: 14:00:00 400 mg via ORAL
  Filled 2019-10-20: qty 2

## 2019-10-20 MED ORDER — LIDOCAINE HCL (PF) 1 % IJ SOLN
INTRAMUSCULAR | Status: DC | PRN
  Administered 2019-10-20: 10 mL via EPIDURAL
  Administered 2019-10-20: 2 mL via EPIDURAL

## 2019-10-20 MED ORDER — OXYCODONE-ACETAMINOPHEN 5-325 MG PO TABS: 1.0000 | ORAL_TABLET | ORAL | Status: DC | PRN

## 2019-10-20 MED ORDER — ACETAMINOPHEN 325 MG PO TABS: 650.0000 mg | ORAL_TABLET | ORAL | Status: DC | PRN

## 2019-10-20 MED ORDER — OXYTOCIN BOLUS FROM INFUSION
500.0000 mL | Freq: Once | INTRAVENOUS | Status: AC
  Administered 2019-10-20: 500 mL via INTRAVENOUS

## 2019-10-20 MED ORDER — BUTORPHANOL TARTRATE 1 MG/ML IJ SOLN: 1.0000 mg | INTRAMUSCULAR | Status: DC | PRN

## 2019-10-20 MED ORDER — PHENYLEPHRINE 40 MCG/ML (10ML) SYRINGE FOR IV PUSH (FOR BLOOD PRESSURE SUPPORT)
80.0000 ug | PREFILLED_SYRINGE | INTRAVENOUS | Status: DC | PRN
  Filled 2019-10-20: qty 10

## 2019-10-20 MED ORDER — SODIUM CHLORIDE (PF) 0.9 % IJ SOLN
INTRAMUSCULAR | Status: DC | PRN
  Administered 2019-10-20: 12 mL/h via EPIDURAL

## 2019-10-20 NOTE — Progress Notes (Signed)
From 1304 to 1316 pt was sitting up in bed for IV insertion. [place] tracing maternal heart rate and not fetal. Monitors adjusted.

## 2019-10-20 NOTE — H&P (Signed)
Robyn Williams is a 34 y.o. female, G7P5015, EGA 38+ weeks with EDC 5-6 presenting for leaking fluid and ctx.  Eval in office with + nitrazine, VE 3 cm with forebag.  Prenatal care essentially uncomplicated, h/o anxiety and depression, as well as well controlled asthma.  OB History    Gravida  7   Para  5   Term  5   Preterm  0   AB  1   Living  5     SAB  1   TAB  0   Ectopic  0   Multiple  0   Live Births  5          Past Medical History:  Diagnosis Date  . Anemia   . Asthma    with pregnancy  . Chronic kidney disease    kidney stones  . Depression    doing good  . Fracture, foot   . History of blood transfusion    received after 1st pregnancy  . Hx of postpartum hemorrhage, currently pregnant    with last pregnancy, 1 week after discharge, told d/t retained placenta  . Urinary tract infection    Past Surgical History:  Procedure Laterality Date  . arm surgery    . NO PAST SURGERIES     Family History: family history includes Arrhythmia in her mother; Asthma in her father and mother; Cancer in her paternal grandmother; Diabetes in her father and mother; Hypertension in her mother. Social History:  reports that she has been smoking cigarettes. She has a 0.13 pack-year smoking history. She has never used smokeless tobacco. She reports that she does not drink alcohol or use drugs.     Maternal Diabetes: No Genetic Screening: Declined Maternal Ultrasounds/Referrals: Normal Fetal Ultrasounds or other Referrals:  None Maternal Substance Abuse:  No Significant Maternal Medications:  None Significant Maternal Lab Results:  Group B Strep negative Other Comments:  None  Review of Systems  Respiratory: Negative.   Cardiovascular: Negative.    Maternal Medical History:  Reason for admission: Rupture of membranes and contractions.   Contractions: Frequency: irregular.   Perceived severity is moderate.    Fetal activity: Perceived fetal activity  is normal.    Prenatal complications: no prenatal complications Prenatal Complications - Diabetes: none.   AROM forebag clear Dilation: 3.5 Effacement (%): 50 Station: -2 Exam by:: Dr. Jackelyn Knife Blood pressure 106/73, pulse (!) 104, temperature 98.2 F (36.8 C), temperature source Oral, resp. rate 16, height 5\' 5"  (1.651 m), weight 89.7 kg, unknown if currently breastfeeding. Maternal Exam:  Uterine Assessment: Contraction strength is moderate.  Contraction frequency is irregular.   Abdomen: Patient reports no abdominal tenderness. Estimated fetal weight is 7 lbs.   Fetal presentation: vertex  Introitus: Normal vulva. Normal vagina.  Nitrazine test: positive. Amniotic fluid character: clear.  Pelvis: adequate for delivery.      Fetal Exam Fetal Monitor Review: Mode: ultrasound.   Baseline rate: 130.  Variability: moderate (6-25 bpm).   Pattern: accelerations present.    Fetal State Assessment: Category I - tracings are normal.     Physical Exam  Vitals reviewed. Constitutional: She appears well-developed and well-nourished.  Cardiovascular: Normal rate and regular rhythm.  Respiratory: Effort normal. No respiratory distress.  GI: Soft.  Genitourinary:    Vulva normal.     Prenatal labs: ABO, Rh: --/--/A POS (04/23 1319) Antibody: NEG (04/23 1319) Rubella:  Immune RPR:   NR HBsAg:   Neg HIV:   NR GBS:  Neg  Assessment/Plan: IUP at 38+ weeks with SROM in early labor.  AROM of forebag just done, she will get epidural, monitor progress, augment labor if needed, anticipate SVD.   Robyn Williams 10/20/2019, 3:09 PM

## 2019-10-20 NOTE — Anesthesia Preprocedure Evaluation (Signed)
Anesthesia Evaluation  Patient identified by MRN, date of birth, ID band Patient awake    Reviewed: Allergy & Precautions, NPO status , Patient's Chart, lab work & pertinent test results  Airway Mallampati: II  TM Distance: >3 FB Neck ROM: Full    Dental no notable dental hx. (+) Teeth Intact   Pulmonary asthma , Current Smoker,    Pulmonary exam normal        Cardiovascular negative cardio ROS Normal cardiovascular exam     Neuro/Psych PSYCHIATRIC DISORDERS Depression negative neurological ROS     GI/Hepatic negative GI ROS, Neg liver ROS,   Endo/Other  Obesity BMI 33  Renal/GU Renal disease  negative genitourinary   Musculoskeletal negative musculoskeletal ROS (+)   Abdominal (+) + obese,   Peds negative pediatric ROS (+)  Hematology  (+) Blood dyscrasia, anemia , hct 34.9, plt 312   Anesthesia Other Findings   Reproductive/Obstetrics (+) Pregnancy G7P5, hx PPH 2/2 retained placenta after last baby but also had blood transfusion with first delivery                              Anesthesia Physical Anesthesia Plan  ASA: III and emergent  Anesthesia Plan: Epidural   Post-op Pain Management:    Induction:   PONV Risk Score and Plan:   Airway Management Planned: Natural Airway  Additional Equipment: None  Intra-op Plan:   Post-operative Plan:   Informed Consent: I have reviewed the patients History and Physical, chart, labs and discussed the procedure including the risks, benefits and alternatives for the proposed anesthesia with the patient or authorized representative who has indicated his/her understanding and acceptance.       Plan Discussed with:   Anesthesia Plan Comments:         Anesthesia Quick Evaluation

## 2019-10-20 NOTE — Anesthesia Procedure Notes (Signed)
Epidural Patient location during procedure: OB  Staffing Anesthesiologist: Lannie Fields, DO Performed: anesthesiologist   Preanesthetic Checklist Completed: patient identified, IV checked, risks and benefits discussed, monitors and equipment checked, pre-op evaluation and timeout performed  Epidural Patient position: sitting Prep: DuraPrep and site prepped and draped Patient monitoring: continuous pulse ox, blood pressure, heart rate and cardiac monitor Approach: midline Location: L3-L4 Injection technique: LOR air  Needle:  Needle type: Tuohy  Needle gauge: 17 G Needle length: 9 cm Needle insertion depth: 6 cm Catheter type: closed end flexible Catheter size: 19 Gauge Catheter at skin depth: 11 cm Test dose: negative  Assessment Sensory level: T8 Events: blood not aspirated, injection not painful, no injection resistance, no paresthesia and negative IV test  Additional Notes Patient identified. Risks/Benefits/Options discussed with patient including but not limited to bleeding, infection, nerve damage, paralysis, failed block, incomplete pain control, headache, blood pressure changes, nausea, vomiting, reactions to medication both or allergic, itching and postpartum back pain. Confirmed with bedside nurse the patient's most recent platelet count. Confirmed with patient that they are not currently taking any anticoagulation, have any bleeding history or any family history of bleeding disorders. Patient expressed understanding and wished to proceed. All questions were answered. Sterile technique was used throughout the entire procedure. Please see nursing notes for vital signs. Test dose was given through epidural catheter and negative prior to continuing to dose epidural or start infusion. Warning signs of high block given to the patient including shortness of breath, tingling/numbness in hands, complete motor block, or any concerning symptoms with instructions to call for  help. Patient was given instructions on fall risk and not to get out of bed. All questions and concerns addressed with instructions to call with any issues or inadequate analgesia.  Reason for block:procedure for pain

## 2019-10-21 ENCOUNTER — Encounter (HOSPITAL_COMMUNITY): Payer: Self-pay | Admitting: Obstetrics and Gynecology

## 2019-10-21 LAB — RPR: RPR Ser Ql: NONREACTIVE

## 2019-10-21 MED ORDER — DIPHENHYDRAMINE HCL 25 MG PO CAPS: 25.0000 mg | ORAL_CAPSULE | Freq: Four times a day (QID) | ORAL | Status: DC | PRN

## 2019-10-21 MED ORDER — WITCH HAZEL-GLYCERIN EX PADS: 1.0000 "application " | MEDICATED_PAD | CUTANEOUS | Status: DC | PRN

## 2019-10-21 MED ORDER — ACETAMINOPHEN 325 MG PO TABS
650.0000 mg | ORAL_TABLET | ORAL | Status: DC | PRN
  Administered 2019-10-21 – 2019-10-22 (×5): 650 mg via ORAL
  Filled 2019-10-21 (×5): qty 2

## 2019-10-21 MED ORDER — IBUPROFEN 100 MG/5ML PO SUSP
600.0000 mg | Freq: Four times a day (QID) | ORAL | Status: DC
  Administered 2019-10-21 – 2019-10-22 (×7): 600 mg via ORAL
  Filled 2019-10-21 (×7): qty 30

## 2019-10-21 MED ORDER — SIMETHICONE 80 MG PO CHEW: 80.0000 mg | CHEWABLE_TABLET | ORAL | Status: DC | PRN

## 2019-10-21 MED ORDER — IBUPROFEN 600 MG PO TABS: 600.0000 mg | ORAL_TABLET | Freq: Four times a day (QID) | ORAL | Status: DC

## 2019-10-21 MED ORDER — FLUOXETINE HCL 20 MG PO CAPS
40.0000 mg | ORAL_CAPSULE | Freq: Every day | ORAL | Status: DC
  Filled 2019-10-21 (×2): qty 2

## 2019-10-21 MED ORDER — PRENATAL MULTIVITAMIN CH: 1.0000 | ORAL_TABLET | Freq: Every day | ORAL | Status: DC

## 2019-10-21 MED ORDER — ZOLPIDEM TARTRATE 5 MG PO TABS: 5.0000 mg | ORAL_TABLET | Freq: Every evening | ORAL | Status: DC | PRN

## 2019-10-21 MED ORDER — DIBUCAINE (PERIANAL) 1 % EX OINT: 1.0000 "application " | TOPICAL_OINTMENT | CUTANEOUS | Status: DC | PRN

## 2019-10-21 MED ORDER — COCONUT OIL OIL: 1.0000 "application " | TOPICAL_OIL | Status: DC | PRN

## 2019-10-21 MED ORDER — OXYCODONE HCL 5 MG PO TABS
5.0000 mg | ORAL_TABLET | ORAL | Status: DC | PRN
  Administered 2019-10-21 – 2019-10-22 (×3): 5 mg via ORAL
  Filled 2019-10-21 (×3): qty 1

## 2019-10-21 MED ORDER — ALBUTEROL SULFATE (2.5 MG/3ML) 0.083% IN NEBU: 3.0000 mL | INHALATION_SOLUTION | Freq: Four times a day (QID) | RESPIRATORY_TRACT | Status: DC | PRN

## 2019-10-21 MED ORDER — OXYCODONE HCL 5 MG PO TABS
10.0000 mg | ORAL_TABLET | ORAL | Status: DC | PRN
  Administered 2019-10-21: 04:00:00 10 mg via ORAL
  Filled 2019-10-21: qty 2

## 2019-10-21 MED ORDER — BENZOCAINE-MENTHOL 20-0.5 % EX AERO: 1.0000 "application " | INHALATION_SPRAY | CUTANEOUS | Status: DC | PRN

## 2019-10-21 MED ORDER — TETANUS-DIPHTH-ACELL PERTUSSIS 5-2.5-18.5 LF-MCG/0.5 IM SUSP: 0.5000 mL | Freq: Once | INTRAMUSCULAR | Status: DC

## 2019-10-21 MED ORDER — MAGNESIUM HYDROXIDE 400 MG/5ML PO SUSP: 30.0000 mL | ORAL | Status: DC | PRN

## 2019-10-21 MED ORDER — METHYLERGONOVINE MALEATE 0.2 MG PO TABS: 0.2000 mg | ORAL_TABLET | ORAL | Status: DC | PRN

## 2019-10-21 MED ORDER — MEASLES, MUMPS & RUBELLA VAC IJ SOLR: 0.5000 mL | Freq: Once | INTRAMUSCULAR | Status: DC

## 2019-10-21 MED ORDER — SENNOSIDES-DOCUSATE SODIUM 8.6-50 MG PO TABS
2.0000 | ORAL_TABLET | ORAL | Status: DC
  Administered 2019-10-21: 2 via ORAL
  Filled 2019-10-21: qty 2

## 2019-10-21 MED ORDER — METHYLERGONOVINE MALEATE 0.2 MG/ML IJ SOLN: 0.2000 mg | INTRAMUSCULAR | Status: DC | PRN

## 2019-10-21 NOTE — Anesthesia Postprocedure Evaluation (Signed)
Anesthesia Post Note  Patient: Robyn Williams  Procedure(s) Performed: AN AD HOC LABOR EPIDURAL     Patient location during evaluation: Mother Baby Anesthesia Type: Epidural Level of consciousness: awake and alert Pain management: pain level controlled Vital Signs Assessment: post-procedure vital signs reviewed and stable Respiratory status: spontaneous breathing, nonlabored ventilation and respiratory function stable Cardiovascular status: stable Postop Assessment: no headache, no backache and epidural receding Anesthetic complications: no    Last Vitals:  Vitals:   10/21/19 0220 10/21/19 0626  BP: 104/72 (!) 104/91  Pulse: (!) 103 (!) 102  Resp: 18 18  Temp: 36.6 C 36.8 C  SpO2: 100% 100%    Last Pain:  Vitals:   10/21/19 0627  TempSrc:   PainSc: 0-No pain   Pain Goal:                   Rica Records

## 2019-10-21 NOTE — Lactation Note (Signed)
This note was copied from a baby's chart. Lactation Consultation Note  Patient Name: Robyn Williams CNGFR'E Date: 10/21/2019  P6, 4 hour ETI female infant. LC entered room mom and infant asleep.   Maternal Data    Feeding Feeding Type: Breast Fed  LATCH Score Latch: Repeated attempts needed to sustain latch, nipple held in mouth throughout feeding, stimulation needed to elicit sucking reflex.  Audible Swallowing: A few with stimulation  Type of Nipple: Flat  Comfort (Breast/Nipple): Soft / non-tender  Hold (Positioning): Assistance needed to correctly position infant at breast and maintain latch.  LATCH Score: 6  Interventions    Lactation Tools Discussed/Used     Consult Status      Danelle Earthly 10/21/2019, 2:51 AM

## 2019-10-21 NOTE — Progress Notes (Signed)
PPD #1 No problems Afeb, VSS Fundus firm, NT at U-1 Continue routine postpartum care 

## 2019-10-21 NOTE — Lactation Note (Signed)
This note was copied from a baby's chart. Lactation Consultation Note  Patient Name: Robyn Williams [place] WGNFA'O Date: 10/21/2019 Reason for consult: Follow-up assessment;Early term 37-38.6wks;Infant weight loss;Other (Comment)(2 % weight loss/ LC  updated the doc flow sheets per mom)  Baby  Is 15 hours old  Baby asleep in the crib and mom resting in bed.  Per mom the baby last fed at 1245 for 20 mins.  Per mom breast feeding is going well.     Maternal Data    Feeding Feeding Type: (per mom baby last fed at 1245 for 20 mins with swallows and comfort)  LATCH Score                   Interventions Interventions: Breast feeding basics reviewed  Lactation Tools Discussed/Used     Consult Status Consult Status: Follow-up Date: 10/22/19 Follow-up type: In-patient    Matilde Sprang Lemoine Goyne 10/21/2019, 2:04 PM

## 2019-10-21 NOTE — Progress Notes (Signed)
CSW acknowledged consult and attempted to meet with MOB. However, MOB was sleeping on arrival.  CSW will meet with MOB at a later time.  Garlin Batdorf D. Cavon Nicolls, MSW, LCSWA Clinical Social Worker [phone]   

## 2019-10-21 NOTE — Lactation Note (Signed)
This note was copied from a baby's chart. Lactation Consultation Note  Patient Name: Robyn Williams [id number] Date: 10/21/2019 Reason for consult: Initial assessment;Early term 37-38.6wks P5, 7 hour ETI female infant. Per mom, she breastfed her 4th child  who is almost 3 years for two months. Mom is active on the [facility] program in [place], [place] had 2 void diapers since birth. Mom feels infant is latching well, he breastfeed for 15 minutes in L&D and 20 minutes at 0345 am and is currently cuing to breastfeed again.  Mom latched infant on right breast using the football hold position, infant latched without difficulty and swallows heard, infant was still breastfeeding after 5 minutes when LC left the room. Mom knows to call RN or LC if she has any questions, concerns or need assistance with latching infant at breast. Mom will breastfeed according to hunger cues, on demand and will not exceed 3 hours without breastfeeding infant.  Reviewed Baby & Me book's Breastfeeding Basics.  Mom made aware of O/P services, breastfeeding support groups, community resources, and our phone # for post-discharge questions.   Maternal Data Formula Feeding for Exclusion: No Has patient been taught Hand Expression?: Yes Does the patient have breastfeeding experience prior to this delivery?: Yes  Feeding Feeding Type: Breast Fed  LATCH Score Latch: Grasps breast easily, tongue down, lips flanged, rhythmical sucking.  Audible Swallowing: Spontaneous and intermittent  Type of Nipple: Everted at rest and after stimulation  Comfort (Breast/Nipple): Soft / non-tender  Hold (Positioning): Assistance needed to correctly position infant at breast and maintain latch.  LATCH Score: 9  Interventions Interventions: Breast feeding basics reviewed;Breast compression;Adjust position;Assisted with latch;Skin to skin;Support pillows;Breast massage;Position options;Hand express;Expressed milk  Lactation  Tools Discussed/Used     Consult Status Consult Status: Follow-up Date: 10/21/19 Follow-up type: In-patient    Danelle Earthly 10/21/2019, 6:36 AM

## 2019-10-22 MED ORDER — IBUPROFEN 100 MG/5ML PO SUSP: 600.0000 mg | Freq: Four times a day (QID) | ORAL | 1 refills | Status: DC

## 2019-10-22 MED ORDER — OXYCODONE HCL 5 MG PO TABS: 5.0000 mg | ORAL_TABLET | ORAL | 0 refills | Status: DC | PRN

## 2019-10-22 NOTE — Plan of Care (Signed)
  Problem: Education: ?Goal: Knowledge of General Education information will improve ?Description: Including pain rating scale, medication(s)/side effects and non-pharmacologic comfort measures ?Outcome: Completed/Met ?  ?Problem: Clinical Measurements: ?Goal: Ability to maintain clinical measurements within normal limits will improve ?Outcome: Completed/Met ?Goal: Will remain free from infection ?Outcome: Completed/Met ?Goal: Diagnostic test results will improve ?Outcome: Completed/Met ?Goal: Respiratory complications will improve ?Outcome: Completed/Met ?Goal: Cardiovascular complication will be avoided ?Outcome: Completed/Met ?  ?Problem: Activity: ?Goal: Risk for activity intolerance will decrease ?Outcome: Completed/Met ?  ?Problem: Nutrition: ?Goal: Adequate nutrition will be maintained ?Outcome: Completed/Met ?  ?Problem: Elimination: ?Goal: Will not experience complications related to bowel motility ?Outcome: Completed/Met ?Goal: Will not experience complications related to urinary retention ?Outcome: Completed/Met ?  ?Problem: Pain Managment: ?Goal: General experience of comfort will improve ?Outcome: Completed/Met ?  ?Problem: Safety: ?Goal: Ability to remain free from injury will improve ?Outcome: Completed/Met ?  ?Problem: Skin Integrity: ?Goal: Risk for impaired skin integrity will decrease ?Outcome: Completed/Met ?  ?Problem: Education: ?Goal: Knowledge of condition will improve ?Outcome: Completed/Met ?  ?Problem: Activity: ?Goal: Will verbalize the importance of balancing activity with adequate rest periods ?Outcome: Completed/Met ?Goal: Ability to tolerate increased activity will improve ?Outcome: Completed/Met ?  ?

## 2019-10-22 NOTE — Lactation Note (Signed)
This note was copied from a baby's chart. Lactation Consultation Note  Patient Name: Robyn Williams [id number]'[id number] Date: 10/22/2019 Reason for consult: Follow-up assessment;Early term 37-38.6wks;Infant weight loss;Other (Comment)(9 % weight loss/ reweigh at 30 N)  Baby is 49 hours old  LC reviewed and updated the doc flow sheets per mom and dad.  Baby recently had fed.  Baby awake and LC offered to assist to latch after hand expressing and  Pre-pumping. Baby opened wide and latched easily with depth, increased swallows  With massage, and compressions. Baby still feeding at 8 mins.  Mom denies soreness, sore nipple and engorgement prevention and tx .  LC instructed mom on the use of the hand pump and checked the flange , #24 F ok  For today and when fuller will need a #27 F and LC provided one.  LC reviewed hand expressing and mom able to repeat demo easily with results large drops.  LC also instructed mom the use of shells between feedings except when sleeping.  Mom aware of the virtual support groups, and has the [facility] pamphlet with phone numbers.  Mom is very motivated to make breastfeeding work and attended the [facility] breast feeding class.  Per mom has a DEBP at home and LC recommended post pumping both breast when the baby isn't cluster feeding and spoon feed back to baby.   Re- weigh check at 19 N will determine if the baby will go home today.    Maternal Data Has patient been taught Hand Expression?: Yes  Feeding Feeding Type: Breast Fed  LATCH Score Latch: Grasps breast easily, tongue down, lips flanged, rhythmical sucking.  Audible Swallowing: Spontaneous and intermittent  Type of Nipple: Everted at rest and after stimulation  Comfort (Breast/Nipple): Filling, red/small blisters or bruises, mild/mod discomfort  Hold (Positioning): Assistance needed to correctly position infant at breast and maintain latch.  LATCH Score: 8  Interventions Interventions: Breast feeding  basics reviewed;Assisted with latch;Skin to skin;Breast massage;Hand express;Breast compression;Adjust position;Support pillows;Position options;Shells;Hand pump  Lactation Tools Discussed/Used Tools: Shells;Pump;Flanges Flange Size: 24;27(checked flange #24 F ok for today and when fuller will need #27F) Shell Type: Inverted Breast pump type: Manual Pump Review: Milk Storage;Setup, frequency, and cleaning Initiated by:: MAI Date initiated:: 10/22/19   Consult Status Consult Status: Complete(weight c heck at 1 N , if ok will be D/C) Date: 10/22/19    Robyn Williams Robyn Williams 10/22/2019, 12:04 PM

## 2019-10-22 NOTE — Discharge Instructions (Signed)
As per discharge pamphlet °

## 2019-10-22 NOTE — Plan of Care (Signed)
  Problem: Education: Goal: Knowledge of General Education information will improve Description: Including pain rating scale, medication(s)/side effects and non-pharmacologic comfort measures Outcome: Completed/Met   Problem: Clinical Measurements: Goal: Ability to maintain clinical measurements within normal limits will improve Outcome: Completed/Met Goal: Will remain free from infection Outcome: Completed/Met Goal: Diagnostic test results will improve Outcome: Completed/Met Goal: Respiratory complications will improve Outcome: Completed/Met Goal: Cardiovascular complication will be avoided Outcome: Completed/Met   Problem: Activity: Goal: Risk for activity intolerance will decrease Outcome: Completed/Met   Problem: Nutrition: Goal: Adequate nutrition will be maintained Outcome: Completed/Met   Problem: Elimination: Goal: Will not experience complications related to bowel motility Outcome: Completed/Met Goal: Will not experience complications related to urinary retention Outcome: Completed/Met   Problem: Pain Managment: Goal: General experience of comfort will improve Outcome: Completed/Met   Problem: Safety: Goal: Ability to remain free from injury will improve Outcome: Completed/Met   Problem: Skin Integrity: Goal: Risk for impaired skin integrity will decrease Outcome: Completed/Met   Problem: Education: Goal: Knowledge of condition will improve Outcome: Completed/Met   Problem: Activity: Goal: Will verbalize the importance of balancing activity with adequate rest periods Outcome: Completed/Met Goal: Ability to tolerate increased activity will improve Outcome: Completed/Met   Problem: Life Cycle: Goal: Chance of risk for complications during the postpartum period will decrease Outcome: Completed/Met   Problem: Role Relationship: Goal: Ability to demonstrate positive interaction with newborn will improve Outcome: Completed/Met   Problem: Skin  Integrity: Goal: Demonstration of wound healing without infection will improve Outcome: Completed/Met   
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
## 2019-10-22 NOTE — Progress Notes (Signed)
PPD #2 No problems other than cramps when nursing Afeb, VSS Discharge home today

## 2019-10-22 NOTE — Discharge Summary (Signed)
OB Discharge Summary     Patient Name: Robyn Williams DOB: 11/10/1985 MRN: [id number]  Date of admission: 10/20/2019 Delivering MD: Willis Modena, Aliyah Abeyta   Date of discharge: 10/22/2019  Admitting diagnosis: Indication for care in labor or delivery [O75.9] Intrauterine pregnancy: [redacted]w[redacted]d     Secondary diagnosis:  Active Problems:   Indication for care in labor or delivery      Discharge diagnosis: Term Pregnancy [place] Hospital course:  Onset of Labor With Vaginal Delivery     34 y.o. yo [id number] at [redacted]w[redacted]d was admitted in Latent Labor with SROM on 10/20/2019. Patient had an uncomplicated labor course as follows:  Membrane Rupture Time/Date: 3:00 PM ,10/20/2019   Intrapartum Procedures: Episiotomy: None [1]                                         Lacerations:  None [1]  Patient had a delivery of a Viable infant. 10/20/2019  Information for the patient's newborn:  Kynadi, Dragos [[id number]]  Delivery Method: Vaginal, Spontaneous(Filed from Delivery Summary)     Pateint had an uncomplicated postpartum course.  She is ambulating, tolerating a regular diet, passing flatus, and urinating well. Patient is discharged home in stable condition on 10/22/19.   Physical exam  Vitals:   10/21/19 0925 10/21/19 1649 10/21/19 2107 10/22/19 0456  BP: (!) 105/59 106/76 109/68 100/74  Pulse: 94 97 (!) 106 95  Resp: 18 18  18   Temp: 97.9 F (36.6 C) 97.9 F (36.6 C)  98.3 F (36.8 C)  TempSrc: Oral Oral  Oral  SpO2: 100%  98%   Weight:      Height:       General: alert Lochia: appropriate Uterine Fundus: firm  Labs: Lab Results  Component Value Date   WBC 8.9 10/20/2019   HGB 11.1 (L) 10/20/2019   HCT 34.9 (L) 10/20/2019   MCV 106.1 (H) 10/20/2019   PLT 312 10/20/2019   CMP Latest Ref Rng & Units 05/07/2016  Glucose 65 - 99 mg/dL 252(H)  BUN 6 - 20 mg/dL 7  Creatinine 0.44 - 1.00 mg/dL 0.53  Sodium 135 - 145 mmol/L 130(L)  Potassium  3.5 - 5.1 mmol/L 3.3(L)  Chloride 101 - 111 mmol/L 101  CO2 22 - 32 mmol/L 21(L)  Calcium 8.9 - 10.3 mg/dL 8.9  Total Protein 6.5 - 8.1 g/dL 6.4(L)  Total Bilirubin 0.3 - 1.2 mg/dL 0.9  Alkaline Phos 38 - 126 U/L 68  AST 15 - 41 U/L 14(L)  ALT 14 - 54 U/L 16    Discharge instruction: per After Visit Summary and "Baby and Me Booklet".  After visit meds:  Allergies as of 10/22/2019      Reactions   Peanut Oil Anaphylaxis   Amoxicillin Er Hives   Ondansetron Hives   Phenergan  [promethazine] Hives   Amoxicillin Other (See Comments)   "makes me feel weird on the inside"   Bee Venom Swelling   Peanut-containing Drug Products Hives   Phenergan [promethazine Hcl] Hives   Zofran [ondansetron Hcl] Hives   Latex Swelling, Rash      Medication List    TAKE these medications   albuterol 108 (90 Base) MCG/ACT  inhaler Commonly known as: VENTOLIN HFA Inhale 2 puffs into the lungs every 6 (six) hours as needed.   clindamycin 1 % gel Commonly known as: CLINDAGEL Apply 1 application topically 2 (two) times daily.   ferrous sulfate 325 (65 FE) MG tablet Take 1 tablet (325 mg total) by mouth 3 (three) times daily with meals.   FLUoxetine 40 MG capsule Commonly known as: PROZAC Take 40 mg by mouth daily.   ibuprofen 100 MG/5ML suspension Commonly known as: ADVIL Take 30 mLs (600 mg total) by mouth every 6 (six) hours.   oxyCODONE 5 MG immediate release tablet Commonly known as: Oxy IR/ROXICODONE Take 1 tablet (5 mg total) by mouth every 4 (four) hours as needed for severe pain.       Diet: routine diet  Activity: Advance as tolerated. Pelvic rest for 6 weeks.   Outpatient follow up:6 weeks  Newborn Data: Live born female  Birth Weight: 7 lb 10.1 oz (3460 g) APGAR: 8, 9  Newborn Delivery   Birth date/time: 10/20/2019 22:44:00 Delivery type: Vaginal, Spontaneous      Baby Feeding: Breast Disposition:home with mother   10/22/2019 Zenaida Niece, MD

## 2019-10-31 ENCOUNTER — Telehealth: Payer: Self-pay

## 2019-10-31 NOTE — Telephone Encounter (Signed)
Robyn Williams and her 74 day old baby were in my office today for lactation support. She shared with me that she had a blood pressure check in your office on Friday 10/27/2019. It was high and she was advised to go to the ED. She did not go to the ED because she did not want to be separated from her baby and was concerned about COVID exposure.  A home remedy that her grandmother gave her was used instead.  She has been monitoring it at home and reports it has been "normal". Discussed with her and her husband that high blood pressure is very serious and advised her to follow-up with your office.

## 2019-11-16 ENCOUNTER — Other Ambulatory Visit: Payer: Self-pay

## 2019-11-16 ENCOUNTER — Encounter (HOSPITAL_BASED_OUTPATIENT_CLINIC_OR_DEPARTMENT_OTHER): Payer: Self-pay | Admitting: Obstetrics and Gynecology

## 2019-11-16 NOTE — Progress Notes (Signed)
Spoke with: Rylee NPO:  After Midnight, no gum, candy, or mints   Arrival time: 84AM Lab needs dos---- Istat 8,UPT           Lab results------EKG 09/18/19 and ECHO 10/16/19 in epic COVID test ------11/21/19 Medications to take morning of surgery: Nifedipine Pre op orders in epic: No requested Diabetic medication -----N/A Patient Special Instructions -----Bring rescue inhaler day of surgery Pre-Op special Istructions -----N/A Ride home: Melanee Spry (husband) [phone]  Patient verbalized understanding of instructions that were given at this phone interview. Patient denies shortness of breath, chest pain, fever, cough a this phone interview.

## 2019-11-21 ENCOUNTER — Other Ambulatory Visit (HOSPITAL_COMMUNITY)
Admission: RE | Admit: 2019-11-21 | Discharge: 2019-11-21 | Disposition: A | Discharge status: Home / Self Care | Payer: BC Managed Care – PPO | Source: Ambulatory Visit | Attending: Obstetrics and Gynecology | Admitting: Obstetrics and Gynecology

## 2019-11-21 DIAGNOSIS — Z20822 Contact with and (suspected) exposure to covid-19: Secondary | ICD-10-CM | POA: Diagnosis not present

## 2019-11-21 DIAGNOSIS — Z01812 Encounter for preprocedural laboratory examination: Secondary | ICD-10-CM | POA: Diagnosis not present

## 2019-11-21 LAB — SARS CORONAVIRUS 2 (TAT 6-24 HRS): SARS Coronavirus 2: NEGATIVE

## 2019-11-24 ENCOUNTER — Encounter (HOSPITAL_BASED_OUTPATIENT_CLINIC_OR_DEPARTMENT_OTHER): Payer: Self-pay | Admitting: Obstetrics and Gynecology

## 2019-11-24 ENCOUNTER — Encounter (HOSPITAL_BASED_OUTPATIENT_CLINIC_OR_DEPARTMENT_OTHER)
Admission: RE | Disposition: A | Discharge status: Home / Self Care | Payer: Self-pay | Source: Home / Self Care | Attending: Obstetrics and Gynecology

## 2019-11-24 ENCOUNTER — Ambulatory Visit (HOSPITAL_BASED_OUTPATIENT_CLINIC_OR_DEPARTMENT_OTHER)
Admission: RE | Admit: 2019-11-24 | Discharge: 2019-11-24 | Disposition: A | Discharge status: Home / Self Care | Payer: BC Managed Care – PPO | Attending: Obstetrics and Gynecology | Admitting: Obstetrics and Gynecology

## 2019-11-24 ENCOUNTER — Ambulatory Visit (HOSPITAL_BASED_OUTPATIENT_CLINIC_OR_DEPARTMENT_OTHER): Payer: BC Managed Care – PPO | Admitting: Certified Registered"

## 2019-11-24 DIAGNOSIS — I10 Essential (primary) hypertension: Secondary | ICD-10-CM | POA: Insufficient documentation

## 2019-11-24 DIAGNOSIS — Z302 Encounter for sterilization: Secondary | ICD-10-CM | POA: Insufficient documentation

## 2019-11-24 DIAGNOSIS — J45909 Unspecified asthma, uncomplicated: Secondary | ICD-10-CM | POA: Diagnosis not present

## 2019-11-24 DIAGNOSIS — F17210 Nicotine dependence, cigarettes, uncomplicated: Secondary | ICD-10-CM | POA: Insufficient documentation

## 2019-11-24 DIAGNOSIS — D649 Anemia, unspecified: Secondary | ICD-10-CM | POA: Insufficient documentation

## 2019-11-24 DIAGNOSIS — Z79899 Other long term (current) drug therapy: Secondary | ICD-10-CM | POA: Diagnosis not present

## 2019-11-24 DIAGNOSIS — F329 Major depressive disorder, single episode, unspecified: Secondary | ICD-10-CM | POA: Insufficient documentation

## 2019-11-24 HISTORY — DX: Personal history of urinary calculi: Z87.442

## 2019-11-24 HISTORY — PX: LAPAROSCOPIC BILATERAL SALPINGECTOMY: SHX5889

## 2019-11-24 HISTORY — DX: Essential (primary) hypertension: I10

## 2019-11-24 HISTORY — DX: Syncope and collapse: R55

## 2019-11-24 HISTORY — DX: Carpal tunnel syndrome, bilateral upper limbs: G56.03

## 2019-11-24 HISTORY — DX: Hidradenitis suppurativa: L73.2

## 2019-11-24 LAB — POCT PREGNANCY, URINE: Preg Test, Ur: NEGATIVE

## 2019-11-24 LAB — POCT I-STAT, CHEM 8
BUN: 27 mg/dL — ABNORMAL HIGH (ref 6–20)
Calcium, Ion: 1.31 mmol/L (ref 1.15–1.40)
Chloride: 105 mmol/L (ref 98–111)
Creatinine, Ser: 0.7 mg/dL (ref 0.44–1.00)
Glucose, Bld: 89 mg/dL (ref 70–99)
HCT: 37 % (ref 36.0–46.0)
Hemoglobin: 12.6 g/dL (ref 12.0–15.0)
Potassium: 3.8 mmol/L (ref 3.5–5.1)
Sodium: 140 mmol/L (ref 135–145)
TCO2: 23 mmol/L (ref 22–32)

## 2019-11-24 LAB — TYPE AND SCREEN
ABO/RH(D): A POS
Antibody Screen: NEGATIVE

## 2019-11-24 LAB — ABO/RH: ABO/RH(D): A POS

## 2019-11-24 SURGERY — SALPINGECTOMY, BILATERAL, LAPAROSCOPIC
Anesthesia: General | Laterality: Bilateral

## 2019-11-24 MED ORDER — SUCCINYLCHOLINE CHLORIDE 200 MG/10ML IV SOSY
PREFILLED_SYRINGE | INTRAVENOUS | Status: DC | PRN
  Administered 2019-11-24: 120 mg via INTRAVENOUS

## 2019-11-24 MED ORDER — OXYCODONE HCL 5 MG PO TABS
5.0000 mg | ORAL_TABLET | Freq: Four times a day (QID) | ORAL | 0 refills | Status: DC | PRN
Start: 2019-11-24 — End: 2022-01-14

## 2019-11-24 MED ORDER — SCOPOLAMINE 1 MG/3DAYS TD PT72
MEDICATED_PATCH | TRANSDERMAL | Status: AC
  Filled 2019-11-24: qty 1

## 2019-11-24 MED ORDER — FENTANYL CITRATE (PF) 100 MCG/2ML IJ SOLN: 25.0000 ug | INTRAMUSCULAR | Status: DC | PRN

## 2019-11-24 MED ORDER — ROCURONIUM BROMIDE 10 MG/ML (PF) SYRINGE
PREFILLED_SYRINGE | INTRAVENOUS | Status: DC | PRN
  Administered 2019-11-24: 50 mg via INTRAVENOUS

## 2019-11-24 MED ORDER — PROPOFOL 10 MG/ML IV BOLUS
INTRAVENOUS | Status: DC | PRN
  Administered 2019-11-24: 200 mg via INTRAVENOUS

## 2019-11-24 MED ORDER — ONDANSETRON HCL 4 MG/2ML IJ SOLN
INTRAMUSCULAR | Status: AC
  Filled 2019-11-24: qty 2

## 2019-11-24 MED ORDER — KETOROLAC TROMETHAMINE 30 MG/ML IJ SOLN
INTRAMUSCULAR | Status: DC | PRN
  Administered 2019-11-24: 30 mg via INTRAVENOUS

## 2019-11-24 MED ORDER — KETOROLAC TROMETHAMINE 15 MG/ML IJ SOLN: 15.0000 mg | INTRAMUSCULAR | Status: DC

## 2019-11-24 MED ORDER — LACTATED RINGERS IV SOLN: INTRAVENOUS | Status: DC

## 2019-11-24 MED ORDER — MIDAZOLAM HCL 2 MG/2ML IJ SOLN
INTRAMUSCULAR | Status: AC
  Filled 2019-11-24: qty 2

## 2019-11-24 MED ORDER — LIDOCAINE 2% (20 MG/ML) 5 ML SYRINGE
INTRAMUSCULAR | Status: AC
  Filled 2019-11-24: qty 5

## 2019-11-24 MED ORDER — MIDAZOLAM HCL 5 MG/5ML IJ SOLN
INTRAMUSCULAR | Status: DC | PRN
  Administered 2019-11-24: 2 mg via INTRAVENOUS

## 2019-11-24 MED ORDER — FENTANYL CITRATE (PF) 100 MCG/2ML IJ SOLN
INTRAMUSCULAR | Status: AC
  Filled 2019-11-24: qty 2

## 2019-11-24 MED ORDER — FENTANYL CITRATE (PF) 100 MCG/2ML IJ SOLN
INTRAMUSCULAR | Status: DC | PRN
  Administered 2019-11-24: 50 ug via INTRAVENOUS
  Administered 2019-11-24: 75 ug via INTRAVENOUS
  Administered 2019-11-24: 50 ug via INTRAVENOUS
  Administered 2019-11-24: 25 ug via INTRAVENOUS

## 2019-11-24 MED ORDER — DEXAMETHASONE SODIUM PHOSPHATE 10 MG/ML IJ SOLN
INTRAMUSCULAR | Status: AC
  Filled 2019-11-24: qty 1

## 2019-11-24 MED ORDER — BUPIVACAINE HCL (PF) 0.25 % IJ SOLN
INTRAMUSCULAR | Status: DC | PRN
  Administered 2019-11-24: 21 mL

## 2019-11-24 MED ORDER — SCOPOLAMINE 1 MG/3DAYS TD PT72
MEDICATED_PATCH | TRANSDERMAL | Status: DC | PRN
  Administered 2019-11-24: 1 via TRANSDERMAL

## 2019-11-24 MED ORDER — LIDOCAINE 2% (20 MG/ML) 5 ML SYRINGE
INTRAMUSCULAR | Status: DC | PRN
  Administered 2019-11-24: 100 mg via INTRAVENOUS

## 2019-11-24 MED ORDER — SUCCINYLCHOLINE CHLORIDE 200 MG/10ML IV SOSY
PREFILLED_SYRINGE | INTRAVENOUS | Status: AC
  Filled 2019-11-24: qty 10

## 2019-11-24 MED ORDER — DEXAMETHASONE SODIUM PHOSPHATE 10 MG/ML IJ SOLN
INTRAMUSCULAR | Status: DC | PRN
  Administered 2019-11-24: 10 mg via INTRAVENOUS

## 2019-11-24 MED ORDER — SUGAMMADEX SODIUM 200 MG/2ML IV SOLN
INTRAVENOUS | Status: DC | PRN
  Administered 2019-11-24: 200 mg via INTRAVENOUS

## 2019-11-24 MED ORDER — OXYCODONE HCL 5 MG PO TABS
5.0000 mg | ORAL_TABLET | Freq: Once | ORAL | Status: AC
  Administered 2019-11-24: 5 mg via ORAL

## 2019-11-24 MED ORDER — ROCURONIUM BROMIDE 10 MG/ML (PF) SYRINGE
PREFILLED_SYRINGE | INTRAVENOUS | Status: AC
  Filled 2019-11-24: qty 10

## 2019-11-24 MED ORDER — PROPOFOL 10 MG/ML IV BOLUS
INTRAVENOUS | Status: AC
  Filled 2019-11-24: qty 20

## 2019-11-24 MED ORDER — OXYCODONE HCL 5 MG PO TABS
ORAL_TABLET | ORAL | Status: AC
  Filled 2019-11-24: qty 1

## 2019-11-24 SURGICAL SUPPLY — 21 items
COVER WAND RF STERILE (DRAPES) ×3 IMPLANT
DERMABOND ADVANCED (GAUZE/BANDAGES/DRESSINGS) ×2
DERMABOND ADVANCED .7 DNX12 (GAUZE/BANDAGES/DRESSINGS) ×1 IMPLANT
DURAPREP 26ML APPLICATOR (WOUND CARE) ×3 IMPLANT
GLOVE BIOGEL PI IND STRL 6.5 (GLOVE) ×2 IMPLANT
GLOVE BIOGEL PI INDICATOR 6.5 (GLOVE) ×4
GLOVE ECLIPSE 6.5 STRL STRAW (GLOVE) ×3 IMPLANT
GOWN STRL REUS W/ TWL LRG LVL3 (GOWN DISPOSABLE) ×2 IMPLANT
GOWN STRL REUS W/TWL LRG LVL3 (GOWN DISPOSABLE) ×4
KIT TURNOVER CYSTO (KITS) ×3 IMPLANT
PACK LAPAROSCOPY BASIN (CUSTOM PROCEDURE TRAY) ×3 IMPLANT
PACK TRENDGUARD 450 HYBRID PRO (MISCELLANEOUS) ×1 IMPLANT
SET TUBE SMOKE EVAC HIGH FLOW (TUBING) ×3 IMPLANT
SHEARS HARMONIC ACE PLUS 36CM (ENDOMECHANICALS) ×3 IMPLANT
SUT VIC AB 3-0 PS2 18 (SUTURE) ×2
SUT VIC AB 3-0 PS2 18XBRD (SUTURE) ×1 IMPLANT
TOWEL OR 17X26 10 PK STRL BLUE (TOWEL DISPOSABLE) ×3 IMPLANT
TRAY FOLEY W/BAG SLVR 14FR (SET/KITS/TRAYS/PACK) ×3 IMPLANT
TRENDGUARD 450 HYBRID PRO PACK (MISCELLANEOUS) ×3
TROCAR BLADELESS OPT 5 100 (ENDOMECHANICALS) ×9 IMPLANT
WARMER LAPAROSCOPE (MISCELLANEOUS) ×3 IMPLANT

## 2019-11-24 NOTE — Discharge Instructions (Signed)
DISCHARGE INSTRUCTIONS: Laparoscopy  The following instructions have been prepared to help you care for yourself upon your return home today.  Wound care:  Do not get the incision wet for the first 24 hours. The incision should be kept clean and dry.  The Band-Aids or dressings may be removed the day after surgery.  Should the incision become sore, red, and swollen after the first week, check with your doctor.  Personal hygiene:  Shower the day after your procedure.  Activity and limitations:  Do NOT drive or operate any equipment today.  Do NOT lift anything more than 15 pounds for 2-3 weeks after surgery.  Do NOT rest in bed all day.  Walking is encouraged. Walk each day, starting slowly with 5-minute walks 3 or 4 times a day. Slowly increase the length of your walks.  Walk up and down stairs slowly.  Do NOT do strenuous activities, such as golfing, playing tennis, bowling, running, biking, weight lifting, gardening, mowing, or vacuuming for 2-4 weeks. Ask your doctor when it is okay to start.  Diet: Eat a light meal as desired this evening. You may resume your usual diet tomorrow.  Return to work: This is dependent on the type of work you do. For the most part you can return to a desk job within a week of surgery. If you are more active at work, please discuss this with your doctor.  What to expect after your surgery: You may have a slight burning sensation when you urinate on the first day. You may have a very small amount of blood in the urine. Expect to have a small amount of vaginal discharge/light bleeding for 1-2 weeks. It is not unusual to have abdominal soreness and bruising for up to 2 weeks. You may be tired and need more rest for about 1 week. You may experience shoulder pain for 24-72 hours. Lying flat in bed may relieve it.  Call your doctor for any of the following:  Develop a fever of 100.4 or greater  Inability to urinate 6 hours after discharge from hospital  Severe  pain not relieved by pain medications  Persistent of heavy bleeding at incision site  Redness or swelling around incision site after a week  Increasing nausea or vomiting  Patient Signature________________________________________ Nurse Signature_________________________________________ Post Anesthesia Home Care Instructions  Activity: Get plenty of rest for the remainder of the day. A responsible adult should stay with you for 24 hours following the procedure.  For the next 24 hours, DO NOT: -Drive a car -Operate machinery -Drink alcoholic beverages -Take any medication unless instructed by your physician -Make any legal decisions or sign important papers.  Meals: Start with liquid foods such as gelatin or soup. Progress to regular foods as tolerated. Avoid greasy, spicy, heavy foods. If nausea and/or vomiting occur, drink only clear liquids until the nausea and/or vomiting subsides. Call your physician if vomiting continues.  Special Instructions/Symptoms: Your throat may feel dry or sore from the anesthesia or the breathing tube placed in your throat during surgery. If this causes discomfort, gargle with warm salt water. The discomfort should disappear within 24 hours.  If you had a scopolamine patch placed behind your ear for the management of post- operative nausea and/or vomiting:  1. The medication in the patch is effective for 72 hours, after which it should be removed.  Wrap patch in a tissue and discard in the trash. Wash hands thoroughly with soap and water. 2. You may remove the patch   earlier than 72 hours if you experience unpleasant side effects which may include dry mouth, dizziness or visual disturbances. 3. Avoid touching the patch. Wash your hands with soap and water after contact with the patch.   

## 2019-11-24 NOTE — Anesthesia Postprocedure Evaluation (Signed)
Anesthesia Post Note  Patient: Robyn Williams  Procedure(s) Performed: LAPAROSCOPIC BILATERAL TUBAL LIGATION (Bilateral )     Patient location during evaluation: PACU Anesthesia Type: General Level of consciousness: awake and alert Pain management: pain level controlled Vital Signs Assessment: post-procedure vital signs reviewed and stable Respiratory status: spontaneous breathing, nonlabored ventilation, respiratory function stable and patient connected to nasal cannula oxygen Cardiovascular status: blood pressure returned to baseline and stable Postop Assessment: no apparent nausea or vomiting Anesthetic complications: no    Last Vitals:  Vitals:   11/24/19 0915 11/24/19 0916  BP: (!) 97/41 109/90  Pulse: (!) 101 95  Resp: 16 17  Temp:    SpO2: 99% 98%    Last Pain:  Vitals:   11/24/19 0916  TempSrc:   PainSc: 0-No pain                 ROSE,GEORGE S

## 2019-11-24 NOTE — Anesthesia Preprocedure Evaluation (Signed)
Anesthesia Evaluation  Patient identified by MRN, date of birth, ID band Patient awake    Reviewed: Allergy & Precautions, NPO status , Patient's Chart, lab work & pertinent test results  Airway Mallampati: II  TM Distance: >3 FB Neck ROM: Full    Dental no notable dental hx.    Pulmonary Current Smoker,    Pulmonary exam normal breath sounds clear to auscultation       Cardiovascular hypertension, Pt. on medications Normal cardiovascular exam Rhythm:Regular Rate:Normal     Neuro/Psych negative neurological ROS  negative psych ROS   GI/Hepatic negative GI ROS, Neg liver ROS,   Endo/Other  negative endocrine ROS  Renal/GU negative Renal ROS  negative genitourinary   Musculoskeletal negative musculoskeletal ROS (+)   Abdominal   Peds negative pediatric ROS (+)  Hematology negative hematology ROS (+)   Anesthesia Other Findings   Reproductive/Obstetrics negative OB ROS                             Anesthesia Physical Anesthesia Plan  ASA: II  Anesthesia Plan: General   Post-op Pain Management:    Induction: Intravenous  PONV Risk Score and Plan: 2 and Ondansetron, Dexamethasone and Treatment may vary due to age or medical condition  Airway Management Planned: Oral ETT  Additional Equipment:   Intra-op Plan:   Post-operative Plan: Extubation in OR  Informed Consent: I have reviewed the patients History and Physical, chart, labs and discussed the procedure including the risks, benefits and alternatives for the proposed anesthesia with the patient or authorized representative who has indicated his/her understanding and acceptance.     Dental advisory given  Plan Discussed with: CRNA and Surgeon  Anesthesia Plan Comments:         Anesthesia Quick Evaluation

## 2019-11-24 NOTE — Transfer of Care (Signed)
Immediate Anesthesia Transfer of Care Note  Patient: Robyn Williams  Procedure(s) Performed: Procedure(s) (LRB): LAPAROSCOPIC BILATERAL TUBAL LIGATION (Bilateral)  Patient Location: PACU  Anesthesia Type: General  Level of Consciousness: awake, sedated, patient cooperative and responds to stimulation  Airway & Oxygen Therapy: Patient Spontanous Breathing and Patient connected to Hartman 02 and soft FM   Post-op Assessment: Report given to PACU RN, Post -op Vital signs reviewed and stable and Patient moving all extremities  Post vital signs: Reviewed and stable  Complications: No apparent anesthesia complications

## 2019-11-24 NOTE — Anesthesia Procedure Notes (Signed)
Procedure Name: Intubation Date/Time: 11/24/2019 7:37 AM Performed by: Justice Rocher, CRNA Pre-anesthesia Checklist: Patient identified, Emergency Drugs available, Suction available, Patient being monitored and Timeout performed Patient Re-evaluated:Patient Re-evaluated prior to induction Oxygen Delivery Method: Circle system utilized Preoxygenation: Pre-oxygenation with 100% oxygen Induction Type: IV induction, Rapid sequence and Cricoid Pressure applied Ventilation: Mask ventilation without difficulty Laryngoscope Size: Mac and 3 Grade View: Grade II Tube type: Oral Number of attempts: 1 Airway Equipment and Method: Stylet and Oral airway Placement Confirmation: ETT inserted through vocal cords under direct vision,  positive ETCO2,  breath sounds checked- equal and bilateral and CO2 detector Secured at: 22 cm Tube secured with: Tape Dental Injury: Teeth and Oropharynx as per pre-operative assessment

## 2019-11-24 NOTE — Op Note (Signed)
Date: 11/24/19  Preoperative Diagnosis: Satisfied Fertility Postoperative Diagnosis: same as above Surgeon: Ellison Hughs, MD Procedure: Laparoscopic bilateral tubal ligation via harmonic Anesthesia: General by Dr Okey Dupre Findings: On external exam, WNl. Preoperative BME reveals retroverted uterus, mobile. Sounded to 9cm c.w exam Specimens: none EBL <10cc IVF 1200LR UOP 500cc clear yellow urine  Indications: Robyn Williams Is a 34yo Robyn Williams with satisfied fertility. Counseled on non-permanent contraception options as listed below. Patient elects for permanent method via BTL.  Consent:  Dicussed with patient R/B/A of bilateral tubal ligation. Alternatives includes internal ligation, LARCs. Discussed increased risk of ectopic pregnancy after procedure (1 in 3 pregnancies post-ligation can be ectopic) and that a pregnancy test should be taken if suspicion arises postpartum. Discussed risk of failure 1/100, backup method should be used for 2-4wks. Discussed R/B or surgical procedure including bleeding, infection, injury to surrounding organs. Patient agrees to blood products if needed. Discussed that BTL does not protect against STI and that barrier protection should be used. Patient understands and intends to comply.    Operative Procedure Patient was taken to the operating room where general anesthesia was obtained without difficulty. She was then prepped and draped in the normal sterile fashion in the dorsal lithotomy position. An appropriate timeout was performed. A speculum was then placed within the vagina and a Hulka tenaculum placed within the cervix for uterine manipulation. The bladder was emptied via Foley catheter. Attention was then turned to the patient's abdomen after draping where the infraumbilical area was injected with approximately 10 cc of quarter percent Marcaine. A 1 cm incision was then made within the umbilicus and a 16mm trochar was then inserted under direct visualization. Gas  flow was then applied and a pneumoperitoneum obtained with approximate 3 L of CO2 gas. With patient in Trendelenburg the uterus and tubes and ovaries were inspected with findings as previously stated. 2 additional 24mm ports were placed in LLQ and RLQ by palpating 2cm above and 2cm medial to corresponding ASIS, transilluminating during Marcaine injeciton and port placement. A Harmonic scalpel was then introduced through the LLQ port and the left fallopian tube grasped approximately 3 cm from the uterine cornua and cauterized in 4 sequential areas with good blanching noted. Attention was then turned to the right fallopian tube which was likewise grasped proximally 3 cm from the uterine cornua and cauterized into sequential spots with good blanching noted there as well. The remainder of the pelvis and abdomen were inspected and found to be normal. The instruments were removed from the abdomen and the pneumoperitoneum reduced through the trocar. The trocar was finally removed and the infraumbilical incision closed with a subcuticular stitch of 3-0 Vicryl. Dermabond was placed. Hulka tenaculum was removed with good hemostasis noted. Patient was then awakened and taken to the recovery room in good condition.

## 2019-11-24 NOTE — H&P (Signed)
Robyn Williams is an 34 y.o. female. Presenting for scheduled surgery. Did take a small sip of water with medicine this morning but otherwise has not had anything to eat. All questions answered, still endorses satisfied fertility  Pertinent Gynecological History: Menses: postpartum bleeding, mildly irregular Contraception: tubal ligation DES exposure: denies Blood transfusions: none Sexually transmitted diseases: past history: trich Previous GYN Procedures: DNC  Last mammogram: too young  Last pap: normal Date: 04/21/19 OB History: G7, H7026   Menstrual History: No LMP recorded (lmp unknown).    Past Medical History:  Diagnosis Date  . Anemia   . Asthma    with pregnancy  . Carpal tunnel syndrome, bilateral   . Depression    doing good  . Fracture, foot   . Hidradenitis suppurativa of left axilla   . History of blood transfusion    received after 1st pregnancy  . History of kidney stones   . Hx of postpartum hemorrhage, currently pregnant    with last pregnancy, 1 week after discharge, told d/t retained placenta  . Hypertension   . Urinary tract infection   . Vasovagal syncopes     Past Surgical History:  Procedure Laterality Date  . arm surgery Left    Hidrandenitis  . NO PAST SURGERIES    . WISDOM TOOTH EXTRACTION      Family History  Problem Relation Age of Onset  . Hypertension Mother   . Arrhythmia Mother   . Diabetes Mother   . Asthma Mother   . Diabetes Father   . Asthma Father   . Cancer Paternal Grandmother   . Anesthesia problems Neg Hx   . Hearing loss Neg Hx     Social History:  reports that she has been smoking cigarettes. She has a 0.13 pack-year smoking history. She has never used smokeless tobacco. She reports current alcohol use. She reports previous drug use.  Allergies:  Allergies  Allergen Reactions  . Peanut Oil Anaphylaxis  . Amoxicillin Er Hives  . Ondansetron Hives  . Phenergan  [Promethazine] Hives  . Amoxicillin Other  (See Comments)    "makes me feel weird on the inside"  . Bee Venom Swelling  . Peanut-Containing Drug Products Hives  . Phenergan [Promethazine Hcl] Hives  . Zofran [Ondansetron Hcl] Hives  . Latex Swelling and Rash    Medications Prior to Admission  Medication Sig Dispense Refill Last Dose  . albuterol (VENTOLIN HFA) 108 (90 Base) MCG/ACT inhaler Inhale 2 puffs into the lungs every 6 (six) hours as needed.   Past Month at Unknown time  . FLUoxetine (PROZAC) 40 MG capsule Take 40 mg by mouth daily.   Past Month at Unknown time  . ibuprofen (ADVIL) 100 MG/5ML suspension Take 30 mLs (600 mg total) by mouth every 6 (six) hours. 480 mL 1 Past Week at Unknown time  . NIFEdipine (ADALAT CC) 60 MG 24 hr tablet Take 60 mg by mouth every morning.   11/24/2019 at 0515  . oxyCODONE (OXY IR/ROXICODONE) 5 MG immediate release tablet Take 1 tablet (5 mg total) by mouth every 4 (four) hours as needed for severe pain. 10 tablet 0 Past Month at Unknown time  . clindamycin (CLINDAGEL) 1 % gel Apply 1 application topically as needed.    More than a month at Unknown time  . ferrous sulfate 325 (65 FE) MG tablet Take 1 tablet (325 mg total) by mouth 3 (three) times daily with meals. (Patient not taking: Reported on 11/16/2019) 90 tablet  3 Unknown at Unknown time    Review of Systems  Constitutional: Negative for chills and fever.  Respiratory: Negative for shortness of breath.   Cardiovascular: Negative for chest pain, palpitations and leg swelling.  Gastrointestinal: Negative for abdominal pain, nausea and vomiting.  Neurological: Negative for dizziness, weakness and headaches.  Psychiatric/Behavioral: Negative for suicidal ideas.    Blood pressure 114/70, pulse (!) 107, temperature 98.3 F (36.8 C), temperature source Oral, resp. rate 16, height 5\' 5"  (1.651 m), weight 87.7 kg, SpO2 98 %, unknown if currently breastfeeding. Physical Exam Gen: AAF, NAD CV: CTAB, RRR (not tachycardic during my exam) Abd:  soft, NTTP, BSx4, diastasis present MSK: Neg for calf edema/Homan's BL  Results for orders placed or performed during the hospital encounter of 11/24/19 (from the past 24 hour(s))  Type and screen [facility]     Status: None (Preliminary result)   Collection Time: 11/24/19  6:07 AM  Result Value Ref Range   ABO/RH(D) PENDING    Antibody Screen PENDING    Sample Expiration      11/27/2019,2359 Performed at [facility], [place] [address] [place], [place] [address]   Pregnancy, urine POC     Status: None   Collection Time: 11/24/19  6:12 AM  Result Value Ref Range   Preg Test, Ur NEGATIVE NEGATIVE  I-STAT, chem 8     Status: Abnormal   Collection Time: 11/24/19  6:27 AM  Result Value Ref Range   Sodium 140 135 - 145 mmol/L   Potassium 3.8 3.5 - 5.1 mmol/L   Chloride 105 98 - 111 mmol/L   BUN 27 (H) 6 - 20 mg/dL   Creatinine, Ser 0.70 0.44 - 1.00 mg/dL   Glucose, Bld 89 70 - 99 mg/dL   Calcium, Ion 1.31 1.15 - 1.40 mmol/L   TCO2 23 22 - 32 mmol/L   Hemoglobin 12.6 12.0 - 15.0 g/dL   HCT 37.0 36.0 - 46.0 %    No results found.  Assessment/Plan: This is a 34yo [id number] presenting for scheduled interval BTL for satisfied fertility. Recently started on Procardia 60Xl for BP elevations postpartum, WNL and asx today.  Discussed with patient R/B/A of postpartum bilateral tubal ligation. Alternatives includes internal ligation, LARCs. Discussed increased risk of ectopic pregnancy after procedure (1 in 3 pregnancies post-ligation can be ectopic) and that a pregnancy test should be taken if suspicion arises postpartum. Discussed risk of failure 1/100, backup method should be used for 2-4wks. Discussed R/B of surgical procedure including bleeding, infection, injury to surrounding organs. Patient agrees to blood products if needed. Discussed that BTL does not protect against STI and that barrier protection should be used. Patient understands and intends to  comply.  [place] 11/24/2019, 7:26 AM

## 2020-07-03 ENCOUNTER — Ambulatory Visit
Admission: EM | Admit: 2020-07-03 | Discharge: 2020-07-03 | Disposition: A | Discharge status: Home / Self Care | Payer: BC Managed Care – PPO | Attending: Internal Medicine | Admitting: Internal Medicine

## 2020-07-03 ENCOUNTER — Encounter: Payer: Self-pay | Admitting: Emergency Medicine

## 2020-07-03 ENCOUNTER — Other Ambulatory Visit: Payer: Self-pay

## 2020-07-03 DIAGNOSIS — B349 Viral infection, unspecified: Secondary | ICD-10-CM | POA: Diagnosis not present

## 2020-07-03 MED ORDER — ALBUTEROL SULFATE HFA 108 (90 BASE) MCG/ACT IN AERS: 2.0000 | INHALATION_SPRAY | Freq: Four times a day (QID) | RESPIRATORY_TRACT | 0 refills | Status: DC | PRN

## 2020-07-03 MED ORDER — BENZONATATE 100 MG PO CAPS
100.0000 mg | ORAL_CAPSULE | Freq: Three times a day (TID) | ORAL | 0 refills | Status: DC
Start: 2020-07-03 — End: 2022-01-14

## 2020-07-03 NOTE — ED Provider Notes (Signed)
[facility]    CSN: [id number] Arrival date & time: 07/03/20  1318      History   Chief Complaint Chief Complaint  Patient presents with  . URI    HPI Robyn Williams is a 35 y.o. female comes to urgent care with complaints of runny nose, nasal congestion, cough of 4 days duration.  Patient symptoms started 4 days ago and has been persistent.  She is not vaccinated against COVID-19 virus.  No nausea or vomiting.  No diarrhea.  Patient endorses shortness of breath with some wheezing.  Shortness of breath is aggravated by activity and resolves spontaneously.  No calf swelling.  No sick contact.   HPI  Past Medical History:  Diagnosis Date  . Anemia   . Asthma    with pregnancy  . Carpal tunnel syndrome, bilateral   . Depression    doing good  . Fracture, foot   . Hidradenitis suppurativa of left axilla   . History of blood transfusion    received after 1st pregnancy  . History of kidney stones   . Hx of postpartum hemorrhage, currently pregnant    with last pregnancy, 1 week after discharge, told d/t retained placenta  . Hypertension   . Urinary tract infection   . Vasovagal syncopes     Patient Active Problem List   Diagnosis Date Noted  . Indication for care in labor or delivery 10/20/2019  . Axillary abscess 12/08/2017  . Hidradenitis suppurativa of left axilla 12/08/2017  . Back pain affecting pregnancy in third trimester   . History of kidney stones   . Other hydronephrosis   . Prolonged latent phase of labor 10/31/2016  . Indication for care in labor and delivery, antepartum 10/30/2016  . Labor and delivery indication for care or intervention 10/29/2016  . Labor and delivery, indication for care 10/20/2016  . Late prenatal care affecting pregnancy in third trimester 09/14/2016    Past Surgical History:  Procedure Laterality Date  . arm surgery Left    Hidrandenitis  . LAPAROSCOPIC BILATERAL SALPINGECTOMY Bilateral 11/24/2019    Procedure: LAPAROSCOPIC BILATERAL TUBAL LIGATION;  Surgeon: Deliah Boston, MD;  Location: [facility];  Service: Gynecology;  Laterality: Bilateral;  . NO PAST SURGERIES    . WISDOM TOOTH EXTRACTION      OB History    Gravida  7   Para  6   Term  6   Preterm  0   AB  1   Living  6     SAB  1   IAB  0   Ectopic  0   Multiple  0   Live Births  6            Home Medications    Prior to Admission medications   Medication Sig Start Date End Date Taking? Authorizing Provider  benzonatate (TESSALON) 100 MG capsule Take 1 capsule (100 mg total) by mouth every 8 (eight) hours. 07/03/20  Yes Meyer Arora, Myrene Galas, MD  albuterol (VENTOLIN HFA) 108 (90 Base) MCG/ACT inhaler Inhale 2 puffs into the lungs every 6 (six) hours as needed. 07/03/20   Chase Picket, MD  clindamycin (CLINDAGEL) 1 % gel Apply 1 application topically as needed.  01/23/19   [provider]  ibuprofen (ADVIL) 100 MG/5ML suspension Take 30 mLs (600 mg total) by mouth every 6 (six) hours. 10/22/19   Meisinger, Sherren Mocha, MD  NIFEdipine (ADALAT CC) 60 MG 24 hr tablet Take 60 mg  by mouth every morning.    [provider]  oxyCODONE (OXY IR/ROXICODONE) 5 MG immediate release tablet Take 1 tablet (5 mg total) by mouth every 6 (six) hours as needed for severe pain. 11/24/19   Shivaji, Valerie Roys, MD  ferrous sulfate 325 (65 FE) MG tablet Take 1 tablet (325 mg total) by mouth 3 (three) times daily with meals. Patient not taking: Reported on 11/16/2019 11/02/16 07/03/20  Gunnar Bulla, CNM  FLUoxetine (PROZAC) 40 MG capsule Take 40 mg by mouth daily. 09/13/19 07/03/20  [provider]    Family History Family History  Problem Relation Age of Onset  . Hypertension Mother   . Arrhythmia Mother   . Diabetes Mother   . Asthma Mother   . Diabetes Father   . Asthma Father   . Cancer Paternal Grandmother   . Anesthesia problems Neg Hx   . Hearing loss Neg Hx     Social  History Social History   Tobacco Use  . Smoking status: Light Tobacco Smoker    Packs/day: 0.25    Years: 0.50    Pack years: 0.12    Types: Cigarettes  . Smokeless tobacco: Never Used  . Tobacco comment: just started 3 weeks ago, not even 1 pack per day  Vaping Use  . Vaping Use: Never used  Substance Use Topics  . Alcohol use: Yes    Comment: Sparingly   . Drug use: Not Currently    Comment: last use 3 weeks ago     Allergies   Peanut oil, Amoxicillin er, Ondansetron, Phenergan  [promethazine], Amoxicillin, Bee venom, Peanut-containing drug products, Phenergan [promethazine hcl], Zofran [ondansetron hcl], and Latex   Review of Systems Review of Systems  Constitutional: Positive for chills. Negative for activity change, diaphoresis, fatigue and fever.  HENT: Positive for congestion and sore throat.   Respiratory: Positive for cough, shortness of breath and wheezing.   Gastrointestinal: Negative for diarrhea, nausea and vomiting.  Neurological: Positive for headaches.     Physical Exam Triage Vital Signs ED Triage Vitals [07/03/20 1526]  Enc Vitals Group     BP 127/89     Pulse Rate 91     Resp 18     Temp 98.4 F (36.9 C)     Temp Source Oral     SpO2 99 %     Weight      Height      Head Circumference      Peak Flow      Pain Score 0     Pain Loc      Pain Edu?      Excl. in GC?    No data found.  Updated Vital Signs BP 127/89 (BP Location: Left Arm)   Pulse 91   Temp 98.4 F (36.9 C) (Oral)   Resp 18   SpO2 99%   Visual Acuity Right Eye Distance:   Left Eye Distance:   Bilateral Distance:    Right Eye Near:   Left Eye Near:    Bilateral Near:     Physical Exam Vitals and nursing note reviewed.  Constitutional:      General: She is not in acute distress.    Appearance: She is not ill-appearing.  Cardiovascular:     Rate and Rhythm: Normal rate and regular rhythm.  Pulmonary:     Effort: Pulmonary effort is normal.     Breath sounds:  Normal breath sounds. No wheezing or rhonchi.  Abdominal:  General: Bowel sounds are normal.     Palpations: Abdomen is soft.     Tenderness: There is no abdominal tenderness. There is no rebound.     Hernia: No hernia is present.  Musculoskeletal:        General: No swelling, deformity or signs of injury. Normal range of motion.  Neurological:     Mental Status: She is alert.      UC Treatments / Results  Labs (all labs ordered are listed, but only abnormal results are displayed) Labs Reviewed  NOVEL CORONAVIRUS, NAA    EKG   Radiology No results found.  Procedures Procedures (including critical care time)  Medications Ordered in UC Medications - No data to display  Initial Impression / Assessment and Plan / UC Course  I have reviewed the triage vital signs and the nursing notes.  Pertinent labs & imaging results that were available during my care of the patient were reviewed by me and considered in my medical decision making (see chart for details).     1.  Acute viral syndrome: COVID-19 PCR test has been sent Increase oral fluid intake Albuterol inhaler as needed for shortness of breath/wheezing Tessalon Perles as needed for cough Please quarantine until COVID-19 test results are available If symptoms worsen please return to urgent care to be reevaluated. Final Clinical Impressions(s) / UC Diagnoses   Final diagnoses:  Acute viral syndrome     Discharge Instructions     Increase oral fluid intake Take medications as directed If symptoms worsen please return to urgent care to be reevaluated Please quarantine with family until COVID-19 test results are available We will call you with recommendations if your labs are normal.   ED Prescriptions    Medication Sig Dispense Auth. Provider   albuterol (VENTOLIN HFA) 108 (90 Base) MCG/ACT inhaler Inhale 2 puffs into the lungs every 6 (six) hours as needed. 18 g Halee Glynn, Britta Mccreedy, MD   benzonatate  (TESSALON) 100 MG capsule Take 1 capsule (100 mg total) by mouth every 8 (eight) hours. 21 capsule Tarence Searcy, Britta Mccreedy, MD     PDMP not reviewed this encounter.   Merrilee Jansky, MD 07/03/20 [phone]

## 2020-07-03 NOTE — Discharge Instructions (Addendum)
Increase oral fluid intake °Take medications as directed °If symptoms worsen please return to urgent care to be reevaluated °Please quarantine with family until COVID-19 test results are available °We will call you with recommendations if your labs are normal. °

## 2020-07-03 NOTE — ED Triage Notes (Signed)
Pt here with URI sx with cough and nasal congestion x 4 days

## 2020-07-06 LAB — NOVEL CORONAVIRUS, NAA: SARS-CoV-2, NAA: NOT DETECTED

## 2022-01-07 ENCOUNTER — Ambulatory Visit: Payer: Self-pay | Admitting: *Deleted

## 2022-01-07 NOTE — Telephone Encounter (Signed)
  Chief Complaint: left breast vein bulging and painful  Symptoms: left breast vein bulging at areola  no fever no discharge  Frequency: 2 years Pertinent Negatives: Patient denies fever, redness  Disposition: [] ED /[] Urgent Care (no appt availability in office) / [x] Appointment(In office/virtual)/ []  [facility]/ [] Home Care/ [] Refused Recommended Disposition /[] [facility]/ []  Follow-up with PCP Additional Notes:   New patient appt established 01/14/22     Reason for Disposition  [1] Breast pain AND [2] cause is not known  Answer Assessment - Initial Assessment Questions 1. SYMPTOM: "What's the main symptom you're concerned about?"  (e.g., lump, pain, rash, nipple discharge)     Sore vein in left breast toward areola  2. LOCATION: "Where is the pain  located?"     Left breast  3. ONSET: "When did pain   start?"     2 years  4. PRIOR HISTORY: "Do you have any history of prior problems with your breasts?" (e.g., lumps, cancer, fibrocystic breast disease)     na 5. CAUSE: "What do you think is causing this symptom?"     Not sure  6. OTHER SYMPTOMS: "Do you have any other symptoms?" (e.g., fever, breast pain, redness or rash, nipple discharge)     Level 8 vein swollen bumps around it no discharge  7. PREGNANCY-BREASTFEEDING: "Is there any chance you are pregnant?" "When was your last menstrual period?" "Are you breastfeeding?"     No  Protocols used: Breast Symptoms-A-AH

## 2022-01-13 NOTE — Progress Notes (Addendum)
New Patient Office Visit  Subjective    Patient ID: Robyn Williams, female    DOB: 1985-10-12  Age: 36 y.o. MRN: [id number]  CC: No chief complaint on file.   HPI Robyn Williams presents for new patient visit to establish care.  Introduced to Publishing rights manager role and practice setting.  All questions answered.  Discussed provider/patient relationship and expectations.   Outpatient Encounter Medications as of 01/14/2022  Medication Sig   albuterol (VENTOLIN HFA) 108 (90 Base) MCG/ACT inhaler Inhale 2 puffs into the lungs every 6 (six) hours as needed.   benzonatate (TESSALON) 100 MG capsule Take 1 capsule (100 mg total) by mouth every 8 (eight) hours.   clindamycin (CLINDAGEL) 1 % gel Apply 1 application topically as needed.    ibuprofen (ADVIL) 100 MG/5ML suspension Take 30 mLs (600 mg total) by mouth every 6 (six) hours.   NIFEdipine (ADALAT CC) 60 MG 24 hr tablet Take 60 mg by mouth every morning.   oxyCODONE (OXY IR/ROXICODONE) 5 MG immediate release tablet Take 1 tablet (5 mg total) by mouth every 6 (six) hours as needed for severe pain.   [DISCONTINUED] ferrous sulfate 325 (65 FE) MG tablet Take 1 tablet (325 mg total) by mouth 3 (three) times daily with meals. (Patient not taking: Reported on 11/16/2019)   [DISCONTINUED] FLUoxetine (PROZAC) 40 MG capsule Take 40 mg by mouth daily.   No facility-administered encounter medications on file as of 01/14/2022.    Past Medical History:  Diagnosis Date   Anemia    Asthma    with pregnancy   Carpal tunnel syndrome, bilateral    Depression    doing good   Fracture, foot    Hidradenitis suppurativa of left axilla    History of blood transfusion    received after 1st pregnancy   History of kidney stones    Hx of postpartum hemorrhage, currently pregnant    with last pregnancy, 1 week after discharge, told d/t retained placenta   Hypertension    Urinary tract infection    Vasovagal syncopes     Past Surgical  History:  Procedure Laterality Date   arm surgery Left    Hidrandenitis   LAPAROSCOPIC BILATERAL SALPINGECTOMY Bilateral 11/24/2019   Procedure: LAPAROSCOPIC BILATERAL TUBAL LIGATION;  Surgeon: Carlisle Cater, MD;  Location: [facility];  Service: Gynecology;  Laterality: Bilateral;   NO PAST SURGERIES     WISDOM TOOTH EXTRACTION      Family History  Problem Relation Age of Onset   Hypertension Mother    Arrhythmia Mother    Diabetes Mother    Asthma Mother    Diabetes Father    Asthma Father    Cancer Paternal Grandmother    Anesthesia problems Neg Hx    Hearing loss Neg Hx     Social History   Socioeconomic History   Marital status: Married    Spouse name: Antwan    Number of children: 5   Years of education: Not on file   Highest education level: Not on file  Occupational History   Not on file  Tobacco Use   Smoking status: Light Smoker    Packs/day: 0.25    Years: 0.50    Total pack years: 0.13    Types: Cigarettes   Smokeless tobacco: Never   Tobacco comments:    just started 3 weeks ago, not even 1 pack per day  Vaping Use   Vaping Use: Never used  Substance  and Sexual Activity   Alcohol use: Yes    Comment: Sparingly    Drug use: Not Currently    Comment: last use 3 weeks ago   Sexual activity: Yes    Birth control/protection: None  Other Topics Concern   Not on file  Social History Narrative   Not on file   Social Determinants of Health   Financial Resource Strain: Not on file  Food Insecurity: Not on file  Transportation Needs: Not on file  Physical Activity: Not on file  Stress: Not on file  Social Connections: Not on file  Intimate Partner Violence: Not on file    ROS      Objective    There were no vitals taken for this visit.  Physical Exam  {Labs (Optional):23779}    Assessment & Plan:   Problem List Items Addressed This Visit   None   No follow-ups on file.   Gerre Scull, NP

## 2022-01-14 ENCOUNTER — Encounter: Payer: Self-pay | Admitting: Nurse Practitioner

## 2022-01-14 ENCOUNTER — Ambulatory Visit: Payer: BC Managed Care – PPO | Admitting: Nurse Practitioner

## 2022-01-14 VITALS — BP 120/88 | HR 92 | Temp 96.5°F | Ht 65.0 in | Wt 230.2 lb

## 2022-01-14 DIAGNOSIS — N644 Mastodynia: Secondary | ICD-10-CM | POA: Insufficient documentation

## 2022-01-14 DIAGNOSIS — I10 Essential (primary) hypertension: Secondary | ICD-10-CM

## 2022-01-14 DIAGNOSIS — R002 Palpitations: Secondary | ICD-10-CM | POA: Diagnosis not present

## 2022-01-14 DIAGNOSIS — Z720 Tobacco use: Secondary | ICD-10-CM

## 2022-01-14 DIAGNOSIS — R350 Frequency of micturition: Secondary | ICD-10-CM | POA: Diagnosis not present

## 2022-01-14 DIAGNOSIS — R600 Localized edema: Secondary | ICD-10-CM | POA: Insufficient documentation

## 2022-01-14 DIAGNOSIS — J4520 Mild intermittent asthma, uncomplicated: Secondary | ICD-10-CM | POA: Insufficient documentation

## 2022-01-14 LAB — LIPID PANEL
Cholesterol: 152 mg/dL (ref 0–200)
HDL: 58.1 mg/dL (ref >39.00)
LDL Cholesterol: 84 mg/dL (ref 0–99)
NonHDL: 93.74
Total CHOL/HDL Ratio: 3
Triglycerides: 51 mg/dL (ref 0.0–149.0)
VLDL: 10.2 mg/dL (ref 0.0–40.0)

## 2022-01-14 LAB — CBC WITH DIFFERENTIAL/PLATELET
Basophils Absolute: 0.1 10*3/uL (ref 0.0–0.1)
Basophils Relative: 1.8 % (ref 0.0–3.0)
Eosinophils Absolute: 0.1 10*3/uL (ref 0.0–0.7)
Eosinophils Relative: 1.9 % (ref 0.0–5.0)
HCT: 39.3 % (ref 36.0–46.0)
Hemoglobin: 13.3 g/dL (ref 12.0–15.0)
Lymphocytes Relative: 60.9 % — ABNORMAL HIGH (ref 12.0–46.0)
Lymphs Abs: 3.4 10*3/uL (ref 0.7–4.0)
MCHC: 33.7 g/dL (ref 30.0–36.0)
MCV: 102.8 fl — ABNORMAL HIGH (ref 78.0–100.0)
Monocytes Absolute: 0.3 10*3/uL (ref 0.1–1.0)
Monocytes Relative: 5.5 % (ref 3.0–12.0)
Neutro Abs: 1.7 10*3/uL (ref 1.4–7.7)
Neutrophils Relative %: 29.9 % — ABNORMAL LOW (ref 43.0–77.0)
Platelets: 224 10*3/uL (ref 150.0–400.0)
RBC: 3.83 Mil/uL — ABNORMAL LOW (ref 3.87–5.11)
RDW: 14 % (ref 11.5–15.5)
WBC: 5.6 10*3/uL (ref 4.0–10.5)

## 2022-01-14 LAB — COMPREHENSIVE METABOLIC PANEL
ALT: 13 U/L (ref 0–35)
AST: 17 U/L (ref 0–37)
Albumin: 4 g/dL (ref 3.5–5.2)
Alkaline Phosphatase: 63 U/L (ref 39–117)
BUN: 17 mg/dL (ref 6–23)
CO2: 28 mEq/L (ref 19–32)
Calcium: 8.6 mg/dL (ref 8.4–10.5)
Chloride: 102 mEq/L (ref 96–112)
Creatinine, Ser: 0.58 mg/dL (ref 0.40–1.20)
GFR: 118.17 mL/min (ref >60.00)
Glucose, Bld: 93 mg/dL (ref 70–99)
Potassium: 4.1 mEq/L (ref 3.5–5.1)
Sodium: 135 mEq/L (ref 135–145)
Total Bilirubin: 0.4 mg/dL (ref 0.2–1.2)
Total Protein: 7.3 g/dL (ref 6.0–8.3)

## 2022-01-14 LAB — HEMOGLOBIN A1C: Hgb A1c MFr Bld: 4.5 % — ABNORMAL LOW (ref 4.6–6.5)

## 2022-01-14 LAB — TSH: TSH: 2.75 u[IU]/mL (ref 0.35–5.50)

## 2022-01-14 MED ORDER — ALBUTEROL SULFATE HFA 108 (90 BASE) MCG/ACT IN AERS: 2.0000 | INHALATION_SPRAY | Freq: Four times a day (QID) | RESPIRATORY_TRACT | 6 refills | Status: AC | PRN

## 2022-01-14 NOTE — Assessment & Plan Note (Addendum)
She smokes hookah a few times a week.  Discussed complete tobacco cessation

## 2022-01-14 NOTE — Addendum Note (Signed)
Addended by: Rodman Pickle A on: 01/14/2022 05:06 PM   Modules accepted: Orders

## 2022-01-14 NOTE — Assessment & Plan Note (Signed)
She has been having intermittent left breast pain along with the vein swelling for the past few years.  We will check diagnostic mammogram and ultrasound of her left breast.  She does have a family history of some breast cancer

## 2022-01-14 NOTE — Patient Instructions (Addendum)
It was great to see you!  Try to limit the amount of salt in your diet and increase your exercise.   I have placed an order for screening mammogram, they should call you to schedule. If you do not hear from them in the next week, please call:  [facility] [address] [place], [address] [place], [place] [phone]   We are checking your labs today and will let you know the results via mychart/phone.   Let's follow-up in 4 weeks, sooner if you have concerns.  If a referral was placed today, you will be contacted for an appointment. Please note that routine referrals can sometimes take up to 3-4 weeks to process. Please call our office if you haven't heard anything after this time frame.  Take care,  Rodman Pickle, NP

## 2022-01-14 NOTE — Assessment & Plan Note (Signed)
She has a history of asthma that is well controlled.  She only uses a albuterol inhaler as needed.  She does not need to use his inhaler every day.  Heat tends to make her symptoms worse.  Follow-up if symptoms worsen or needing to use inhaler more frequently.  Albuterol inhaler refill sent to the pharmacy.

## 2022-01-14 NOTE — Assessment & Plan Note (Signed)
Intermittent bilateral leg edema.  There is no swelling on exam today.  Encouraged her to limit the amount of salt in her diet and to keep her legs elevated when seated.  Follow-up as symptoms worsen.

## 2022-01-14 NOTE — Assessment & Plan Note (Signed)
Chronic, she has been having intermittent issues with her blood pressure since she was pregnant.  Her blood pressure today is 120/88.  Discussed limiting salt and salty foods in her diet.  This can also help with the swelling that is going on in her legs.  Continue checking blood pressure daily.  Follow-up in 2 to 3 weeks.

## 2022-01-14 NOTE — Assessment & Plan Note (Signed)
She has been experiencing intermittent palpitations, about twice per month.  She states that they do not last too long and laying down will usually make it go away.  Discussed some vagal maneuvers that she can try to help if they occur.  We will check CBC, CMP, TSH today.  Discussed referral if symptoms worsen or occur more frequently

## 2022-01-31 ENCOUNTER — Ambulatory Visit
Admission: RE | Admit: 2022-01-31 | Discharge: 2022-01-31 | Disposition: A | Discharge status: Home / Self Care | Payer: BC Managed Care – PPO | Source: Ambulatory Visit | Attending: Nurse Practitioner | Admitting: Nurse Practitioner

## 2022-01-31 ENCOUNTER — Ambulatory Visit: Payer: BC Managed Care – PPO

## 2022-01-31 DIAGNOSIS — N644 Mastodynia: Secondary | ICD-10-CM

## 2022-02-09 NOTE — Progress Notes (Deleted)
LMP 01/12/2022 (Exact Date)    Subjective:    Patient ID: Robyn Williams, female    DOB: [birth year], 36 y.o.   MRN: [id number]  CC: No chief complaint on file.  HPI: Robyn Williams is a 36 y.o. female presenting on 02/11/2022 for comprehensive medical examination. Current medical complaints include:{Blank single:19197::"none","***"}  She currently lives with: Menopausal Symptoms: {Blank single:19197::"yes","no"}  Depression Screen done today and results listed below:     01/14/2022    2:35 PM  Depression screen PHQ 2/9  Decreased Interest 1  Down, Depressed, Hopeless 0  PHQ - 2 Score 1  Altered sleeping 2  Tired, decreased energy 0  Change in appetite 0  Feeling bad or failure about yourself  0  Trouble concentrating 0  Moving slowly or fidgety/restless 0  Suicidal thoughts 0  PHQ-9 Score 3  Difficult doing work/chores Not difficult at all    The patient {has/does not have:19849} a history of falls. I {did/did not:19850} complete a risk assessment for falls. A plan of care for falls {was/was not:19852} documented.   Past Medical History:  Past Medical History:  Diagnosis Date   Anemia    Asthma    with pregnancy   Carpal tunnel syndrome, bilateral    Depression    doing good   Fracture, foot    Hidradenitis suppurativa of left axilla    History of blood transfusion    received after 1st pregnancy   History of kidney stones    Hx of postpartum hemorrhage, currently pregnant    with last pregnancy, 1 week after discharge, told d/t retained placenta   Hypertension    Urinary tract infection    Vasovagal syncopes     Surgical History:  Past Surgical History:  Procedure Laterality Date   arm surgery Left    Hidrandenitis   LAPAROSCOPIC BILATERAL SALPINGECTOMY Bilateral 11/24/2019   Procedure: LAPAROSCOPIC BILATERAL TUBAL LIGATION;  Surgeon: Carlisle Cater, MD;  Location: [facility];  Service: Gynecology;  Laterality:  Bilateral;   WISDOM TOOTH EXTRACTION      Medications:  Current Outpatient Medications on File Prior to Visit  Medication Sig   albuterol (VENTOLIN HFA) 108 (90 Base) MCG/ACT inhaler Inhale 2 puffs into the lungs every 6 (six) hours as needed for wheezing or shortness of breath.   [DISCONTINUED] ferrous sulfate 325 (65 FE) MG tablet Take 1 tablet (325 mg total) by mouth 3 (three) times daily with meals. (Patient not taking: Reported on 11/16/2019)   [DISCONTINUED] FLUoxetine (PROZAC) 40 MG capsule Take 40 mg by mouth daily.   No current facility-administered medications on file prior to visit.    Allergies:  Allergies  Allergen Reactions   Peanut Oil Anaphylaxis   Amoxicillin Er Hives   Ondansetron Hives   Phenergan  [Promethazine] Hives   Amoxicillin Other (See Comments)    "makes me feel weird on the inside"   Bee Venom Swelling   Peanut-Containing Drug Products Hives   Phenergan [Promethazine Hcl] Hives   Zofran [Ondansetron Hcl] Hives   Latex Swelling and Rash    Social History:   Social History   Tobacco Use  Smoking Status Light Smoker   Packs/day: 0.25   Years: 0.50   Total pack years: 0.13   Types: Cigarettes  Smokeless Tobacco Never  Tobacco Comments   just started 3 weeks ago, not even 1 pack per day   Social History   Substance and Sexual Activity  Alcohol Use Yes  Alcohol/week: 4.0 standard drinks of alcohol   Types: 4 Standard drinks or equivalent per week    Family History:  Family History  Problem Relation Age of Onset   Hypertension Mother    Arrhythmia Mother    Diabetes Mother    Asthma Mother    Diabetes Father    Asthma Father    Cancer Paternal Grandmother    Anesthesia problems Neg Hx    Hearing loss Neg Hx     Past medical history, surgical history, medications, allergies, family history and social history reviewed with patient today and changes made to appropriate areas of the chart.   ROS All other ROS negative except what  is listed above and in the HPI.      Objective:    LMP 01/12/2022 (Exact Date)   Wt Readings from Last 3 Encounters:  01/14/22 230 lb 3.2 oz (104.4 kg)  11/24/19 193 lb 4.8 oz (87.7 kg)  10/20/19 197 lb 12.8 oz (89.7 kg)    Physical Exam  Results for orders placed or performed in visit on 01/19/22  HM PAP SMEAR  Result Value Ref Range   HM Pap smear normal       Assessment & Plan:   Problem List Items Addressed This Visit   None    Follow up plan: No follow-ups on file.   LABORATORY TESTING:  - Pap smear: up to date  IMMUNIZATIONS:   - Tdap: Tetanus vaccination status reviewed: last tetanus booster within 10 years. - Influenza: Postponed to flu season - Pneumovax: Not applicable - Prevnar: Not applicable - HPV: Up to date - Zostavax vaccine: Not applicable  SCREENING: -Mammogram: Not applicable  - Colonoscopy: Not applicable  - Bone Density: Not applicable  -Hearing Test: Not applicable  -Spirometry: Not applicable   PATIENT COUNSELING:   Advised to take 1 mg of folate supplement per day if capable of pregnancy.   Sexuality: Discussed sexually transmitted diseases, partner selection, use of condoms, avoidance of unintended pregnancy  and contraceptive alternatives.   Advised to avoid cigarette smoking.  I discussed with the patient that most people either abstain from alcohol or drink within safe limits (<=14/week and <=4 drinks/occasion for males, <=7/weeks and <= 3 drinks/occasion for females) and that the risk for alcohol disorders and other health effects rises proportionally with the number of drinks per week and how often a drinker exceeds daily limits.  Discussed cessation/primary prevention of drug use and availability of treatment for abuse.   Diet: Encouraged to adjust caloric intake to maintain  or achieve ideal body weight, to reduce intake of dietary saturated fat and total fat, to limit sodium intake by avoiding high sodium foods and not adding  table salt, and to maintain adequate dietary potassium and calcium preferably from fresh fruits, vegetables, and low-fat dairy products.    stressed the importance of regular exercise  Injury prevention: Discussed safety belts, safety helmets, smoke detector, smoking near bedding or upholstery.   Dental health: Discussed importance of regular tooth brushing, flossing, and dental visits.    NEXT PREVENTATIVE PHYSICAL DUE IN 1 YEAR. No follow-ups on file.

## 2022-02-11 ENCOUNTER — Encounter: Payer: BC Managed Care – PPO | Admitting: Nurse Practitioner

## 2022-02-19 NOTE — Progress Notes (Deleted)
There were no vitals taken for this visit.   Subjective:    Patient ID: Robyn Williams, female    DOB: [birth year], 36 y.o.   MRN: [id number]  CC: No chief complaint on file.  HPI: Robyn Williams is a 36 y.o. female presenting on 02/20/2022 for comprehensive medical examination. Current medical complaints include:{Blank single:19197::"none","***"}  She currently lives with: Menopausal Symptoms: {Blank single:19197::"yes","no"}  Depression Screen done today and results listed below:     01/14/2022    2:35 PM  Depression screen PHQ 2/9  Decreased Interest 1  Down, Depressed, Hopeless 0  PHQ - 2 Score 1  Altered sleeping 2  Tired, decreased energy 0  Change in appetite 0  Feeling bad or failure about yourself  0  Trouble concentrating 0  Moving slowly or fidgety/restless 0  Suicidal thoughts 0  PHQ-9 Score 3  Difficult doing work/chores Not difficult at all    The patient {has/does not have:19849} a history of falls. I {did/did not:19850} complete a risk assessment for falls. A plan of care for falls {was/was not:19852} documented.   Past Medical History:  Past Medical History:  Diagnosis Date   Anemia    Asthma    with pregnancy   Carpal tunnel syndrome, bilateral    Depression    doing good   Fracture, foot    Hidradenitis suppurativa of left axilla    History of blood transfusion    received after 1st pregnancy   History of kidney stones    Hx of postpartum hemorrhage, currently pregnant    with last pregnancy, 1 week after discharge, told d/t retained placenta   Hypertension    Urinary tract infection    Vasovagal syncopes     Surgical History:  Past Surgical History:  Procedure Laterality Date   arm surgery Left    Hidrandenitis   LAPAROSCOPIC BILATERAL SALPINGECTOMY Bilateral 11/24/2019   Procedure: LAPAROSCOPIC BILATERAL TUBAL LIGATION;  Surgeon: Carlisle Cater, MD;  Location: [facility];  Service: Gynecology;   Laterality: Bilateral;   WISDOM TOOTH EXTRACTION      Medications:  Current Outpatient Medications on File Prior to Visit  Medication Sig   albuterol (VENTOLIN HFA) 108 (90 Base) MCG/ACT inhaler Inhale 2 puffs into the lungs every 6 (six) hours as needed for wheezing or shortness of breath.   [DISCONTINUED] ferrous sulfate 325 (65 FE) MG tablet Take 1 tablet (325 mg total) by mouth 3 (three) times daily with meals. (Patient not taking: Reported on 11/16/2019)   [DISCONTINUED] FLUoxetine (PROZAC) 40 MG capsule Take 40 mg by mouth daily.   No current facility-administered medications on file prior to visit.    Allergies:  Allergies  Allergen Reactions   Peanut Oil Anaphylaxis   Amoxicillin Er Hives   Ondansetron Hives   Phenergan  [Promethazine] Hives   Amoxicillin Other (See Comments)    "makes me feel weird on the inside"   Bee Venom Swelling   Peanut-Containing Drug Products Hives   Phenergan [Promethazine Hcl] Hives   Zofran [Ondansetron Hcl] Hives   Latex Swelling and Rash    Social History:   Social History   Tobacco Use  Smoking Status Light Smoker   Packs/day: 0.25   Years: 0.50   Total pack years: 0.13   Types: Cigarettes  Smokeless Tobacco Never  Tobacco Comments   just started 3 weeks ago, not even 1 pack per day   Social History   Substance and Sexual Activity  Alcohol  Use Yes   Alcohol/week: 4.0 standard drinks of alcohol   Types: 4 Standard drinks or equivalent per week    Family History:  Family History  Problem Relation Age of Onset   Hypertension Mother    Arrhythmia Mother    Diabetes Mother    Asthma Mother    Diabetes Father    Asthma Father    Cancer Paternal Grandmother    Anesthesia problems Neg Hx    Hearing loss Neg Hx     Past medical history, surgical history, medications, allergies, family history and social history reviewed with patient today and changes made to appropriate areas of the chart.   ROS All other ROS negative  except what is listed above and in the HPI.      Objective:    There were no vitals taken for this visit.  Wt Readings from Last 3 Encounters:  01/14/22 230 lb 3.2 oz (104.4 kg)  11/24/19 193 lb 4.8 oz (87.7 kg)  10/20/19 197 lb 12.8 oz (89.7 kg)    Physical Exam  Results for orders placed or performed in visit on 01/19/22  HM PAP SMEAR  Result Value Ref Range   HM Pap smear normal       Assessment & Plan:   Problem List Items Addressed This Visit   None    Follow up plan: No follow-ups on file.   LABORATORY TESTING:  - Pap smear: up to date  IMMUNIZATIONS:   - Tdap: Tetanus vaccination status reviewed: last tetanus booster within 10 years. - Influenza: {Blank single:19197::"Up to date","Administered today","Postponed to flu season","Refused","Given elsewhere"} - Pneumovax: Not applicable - Prevnar: {Blank single:19197::"Up to date","Administered today","Not applicable","Refused","Given elsewhere"} - HPV: Not applicable - Zostavax vaccine: Not applicable  SCREENING: -Mammogram: Not applicable  - Colonoscopy: Not applicable  - Bone Density: Not applicable  -Hearing Test: Not applicable  -Spirometry: Not applicable   PATIENT COUNSELING:   Advised to take 1 mg of folate supplement per day if capable of pregnancy.   Sexuality: Discussed sexually transmitted diseases, partner selection, use of condoms, avoidance of unintended pregnancy  and contraceptive alternatives.   Advised to avoid cigarette smoking.  I discussed with the patient that most people either abstain from alcohol or drink within safe limits (<=14/week and <=4 drinks/occasion for males, <=7/weeks and <= 3 drinks/occasion for females) and that the risk for alcohol disorders and other health effects rises proportionally with the number of drinks per week and how often a drinker exceeds daily limits.  Discussed cessation/primary prevention of drug use and availability of treatment for abuse.   Diet:  Encouraged to adjust caloric intake to maintain  or achieve ideal body weight, to reduce intake of dietary saturated fat and total fat, to limit sodium intake by avoiding high sodium foods and not adding table salt, and to maintain adequate dietary potassium and calcium preferably from fresh fruits, vegetables, and low-fat dairy products.    stressed the importance of regular exercise  Injury prevention: Discussed safety belts, safety helmets, smoke detector, smoking near bedding or upholstery.   Dental health: Discussed importance of regular tooth brushing, flossing, and dental visits.    NEXT PREVENTATIVE PHYSICAL DUE IN 1 YEAR. No follow-ups on file.

## 2022-02-20 ENCOUNTER — Telehealth: Payer: Self-pay | Admitting: Nurse Practitioner

## 2022-02-20 ENCOUNTER — Encounter: Payer: BC Managed Care – PPO | Admitting: Nurse Practitioner

## 2022-02-20 NOTE — Telephone Encounter (Signed)
8.25.23 no show letter sent 

## 2022-02-23 NOTE — Telephone Encounter (Signed)
1st no show, fee waived, letter sent 

## 2022-12-03 ENCOUNTER — Encounter: Payer: Self-pay | Admitting: Nurse Practitioner

## 2022-12-03 ENCOUNTER — Ambulatory Visit (INDEPENDENT_AMBULATORY_CARE_PROVIDER_SITE_OTHER): Payer: BC Managed Care – PPO | Admitting: Nurse Practitioner

## 2022-12-03 VITALS — BP 110/70 | HR 96 | Temp 97.8°F | Ht 65.0 in | Wt 198.0 lb

## 2022-12-03 DIAGNOSIS — N63 Unspecified lump in unspecified breast: Secondary | ICD-10-CM | POA: Insufficient documentation

## 2022-12-03 NOTE — Patient Instructions (Addendum)
It was great to see you!  I have ordered a mammogram of your left breast, they will call. If you don't hear from them, please call:   [facility] [address] [place], [address] [place], [place] [phone]  Let's follow-up in 4 weeks CPE  Take care,  Rodman Pickle, NP

## 2022-12-03 NOTE — Progress Notes (Signed)
Acute Office Visit  Subjective:     Patient ID: Robyn Williams, female    DOB: 02/05/1986, 37 y.o.   MRN: [id number]  Chief Complaint  Patient presents with   Breast Pain    Lump under left breast than has gone down now    HPI Patient is in today for lump under left breast that has now went down in size. Patient stated about 2 weeks ago she noticed a small bump under left breast  that increased in size. She states it grew to the size of a quarter. She reveals it was painful. There was nothing that relieved the discomfort however she stated she stopped wearing a bra which seemed to help some. She has revealed having sharp burning sensation in this breast  that comes and goes. She confirms the pain is not like the pain from Hidradenitis Suppurativa exacerbation. She stated she just started menstrual cycle Monday of this week. Since cycle started she noticed the lump has decrease in size and it is only tender when touched. She states she notices more obvious pores at nipple and white discharge from her left nipple that is not new. She feels symptoms have improved considerably. She denies any cardiopulmonary concerns.   Review of Systems  Constitutional:  Negative for chills and fever.  HENT: Negative.    Respiratory: Negative.  Negative for cough, shortness of breath and wheezing.   Cardiovascular: Negative.  Negative for chest pain and palpitations.  Genitourinary: Negative.   Musculoskeletal: Negative.   Neurological: Negative.   Psychiatric/Behavioral: Negative.          Objective:    BP 110/70 (BP Location: Left Arm)   Pulse 96   Temp 97.8 F (36.6 C)   Ht 5\' 5"  (1.651 m)   Wt 198 lb (89.8 kg)   LMP 11/30/2022 (Exact Date)   SpO2 95%   BMI 32.95 kg/m    Physical Exam Constitutional:      General: She is not in acute distress.    Appearance: Normal appearance. She is not ill-appearing.  HENT:     Head: Normocephalic and atraumatic.  Cardiovascular:     Rate  and Rhythm: Normal rate and regular rhythm.     Pulses: Normal pulses.     Heart sounds: Normal heart sounds. No murmur heard.    No friction rub. No gallop.  Pulmonary:     Effort: Pulmonary effort is normal. No respiratory distress.     Breath sounds: No wheezing or rhonchi.  Chest:  Breasts:    Right: Normal.     Left: Tenderness present.     Comments: Palpated a small pea-size lump under lower left breast. Skin very dry under left breast.  Abdominal:     Palpations: Abdomen is soft.  Musculoskeletal:        General: Normal range of motion.     Cervical back: Normal range of motion and neck supple. No rigidity or tenderness.  Lymphadenopathy:     Upper Body:     Right upper body: No supraclavicular or axillary adenopathy.     Left upper body: No supraclavicular or axillary adenopathy.  Skin:    General: Skin is warm and dry.     Findings: No rash.  Neurological:     Mental Status: She is alert and oriented to person, place, and time.  Psychiatric:        Mood and Affect: Mood normal.        Behavior: Behavior normal.  Thought Content: Thought content normal.        Judgment: Judgment normal.     No results found for any visits on 12/03/22.      Assessment & Plan:   Problem List Items Addressed This Visit       Other   Breast nodule - Primary    Palpated a mildly tender nodule (pea-size) under left breast. Ordered unilateral mammogram to further assess breast nodule. Will return in 1 week for complete physical exam.       Relevant Orders   MM Digital Diagnostic Unilat L    No orders of the defined types were placed in this encounter.   Return in about 1 week (around 12/10/2022) for CPE.  Bishop Dublin, RN

## 2022-12-03 NOTE — Assessment & Plan Note (Signed)
Palpated a mildly tender nodule (pea-size) under left breast. Ordered unilateral mammogram to further assess breast nodule. Will return in 1 week for complete physical exam.

## 2022-12-28 DIAGNOSIS — Z419 Encounter for procedure for purposes other than remedying health state, unspecified: Secondary | ICD-10-CM | POA: Diagnosis not present

## 2022-12-30 ENCOUNTER — Other Ambulatory Visit: Payer: Self-pay | Admitting: Nurse Practitioner

## 2022-12-30 DIAGNOSIS — N63 Unspecified lump in unspecified breast: Secondary | ICD-10-CM

## 2023-01-08 ENCOUNTER — Encounter: Payer: BC Managed Care – PPO | Admitting: Nurse Practitioner

## 2023-01-22 ENCOUNTER — Telehealth: Payer: Self-pay | Admitting: Nurse Practitioner

## 2023-01-22 ENCOUNTER — Encounter: Payer: BC Managed Care – PPO | Admitting: Nurse Practitioner

## 2023-01-22 NOTE — Telephone Encounter (Signed)
NS 7/26 no reason available. Letter  sent

## 2023-01-28 DIAGNOSIS — Z419 Encounter for procedure for purposes other than remedying health state, unspecified: Secondary | ICD-10-CM | POA: Diagnosis not present

## 2023-02-28 DIAGNOSIS — Z419 Encounter for procedure for purposes other than remedying health state, unspecified: Secondary | ICD-10-CM | POA: Diagnosis not present

## 2023-03-30 DIAGNOSIS — Z419 Encounter for procedure for purposes other than remedying health state, unspecified: Secondary | ICD-10-CM | POA: Diagnosis not present

## 2023-04-26 ENCOUNTER — Ambulatory Visit (INDEPENDENT_AMBULATORY_CARE_PROVIDER_SITE_OTHER): Payer: BC Managed Care – PPO | Admitting: Nurse Practitioner

## 2023-04-26 ENCOUNTER — Other Ambulatory Visit: Payer: BC Managed Care – PPO

## 2023-04-26 ENCOUNTER — Telehealth: Payer: Self-pay | Admitting: Nurse Practitioner

## 2023-04-26 VITALS — BP 112/80 | HR 80 | Temp 97.0°F | Ht 65.0 in | Wt 202.8 lb

## 2023-04-26 DIAGNOSIS — N920 Excessive and frequent menstruation with regular cycle: Secondary | ICD-10-CM | POA: Diagnosis not present

## 2023-04-26 DIAGNOSIS — D7589 Other specified diseases of blood and blood-forming organs: Secondary | ICD-10-CM

## 2023-04-26 DIAGNOSIS — G43709 Chronic migraine without aura, not intractable, without status migrainosus: Secondary | ICD-10-CM | POA: Insufficient documentation

## 2023-04-26 LAB — CBC WITH DIFFERENTIAL/PLATELET
Basophils Absolute: 0.1 10*3/uL (ref 0.0–0.1)
Basophils Relative: 1.2 % (ref 0.0–3.0)
Eosinophils Absolute: 0.1 10*3/uL (ref 0.0–0.7)
Eosinophils Relative: 1.3 % (ref 0.0–5.0)
HCT: 39.8 % (ref 36.0–46.0)
Hemoglobin: 12.9 g/dL (ref 12.0–15.0)
Lymphocytes Relative: 50 % — ABNORMAL HIGH (ref 12.0–46.0)
Lymphs Abs: 2.2 10*3/uL (ref 0.7–4.0)
MCHC: 32.5 g/dL (ref 30.0–36.0)
MCV: 106.8 fL — ABNORMAL HIGH (ref 78.0–100.0)
Monocytes Absolute: 0.3 10*3/uL (ref 0.1–1.0)
Monocytes Relative: 6.5 % (ref 3.0–12.0)
Neutro Abs: 1.8 10*3/uL (ref 1.4–7.7)
Neutrophils Relative %: 41 % — ABNORMAL LOW (ref 43.0–77.0)
Platelets: 293 10*3/uL (ref 150.0–400.0)
RBC: 3.73 Mil/uL — ABNORMAL LOW (ref 3.87–5.11)
RDW: 13.8 % (ref 11.5–15.5)
WBC: 4.4 10*3/uL (ref 4.0–10.5)

## 2023-04-26 LAB — COMPREHENSIVE METABOLIC PANEL
ALT: 11 U/L (ref 0–35)
AST: 12 U/L (ref 0–37)
Albumin: 4.2 g/dL (ref 3.5–5.2)
Alkaline Phosphatase: 65 U/L (ref 39–117)
BUN: 16 mg/dL (ref 6–23)
CO2: 28 meq/L (ref 19–32)
Calcium: 9 mg/dL (ref 8.4–10.5)
Chloride: 105 meq/L (ref 96–112)
Creatinine, Ser: 0.59 mg/dL (ref 0.40–1.20)
GFR: 116.64 mL/min (ref >60.00)
Glucose, Bld: 92 mg/dL (ref 70–99)
Potassium: 4.1 meq/L (ref 3.5–5.1)
Sodium: 139 meq/L (ref 135–145)
Total Bilirubin: 0.5 mg/dL (ref 0.2–1.2)
Total Protein: 6.9 g/dL (ref 6.0–8.3)

## 2023-04-26 LAB — TSH: TSH: 2.27 u[IU]/mL (ref 0.35–5.50)

## 2023-04-26 NOTE — Addendum Note (Signed)
Addended by: Rodman Pickle A on: 04/26/2023 02:52 PM   Modules accepted: Orders

## 2023-04-26 NOTE — Progress Notes (Signed)
Acute Office Visit  Subjective:     Patient ID: Robyn Williams, female    DOB: 11-Mar-1986, 37 y.o.   MRN: [id number]  Chief Complaint  Patient presents with   Menstrual Problem    Passing large clots during menstrual cycle, heavy bleeding, bad cramps and pains since age 79, lump in stomach, frequent headaches    HPI Patient is in today for menorrhagia and frequent headaches.   Discussed the use of AI scribe software for clinical note transcription with the patient, who gave verbal consent to proceed.  History of Present Illness   Robyn Williams, a patient with a known history of endometriosis, presents with a long-standing issue of irregular, heavy menstrual periods that can last anywhere from three to eight days. The heaviness of her periods varies, with some being extremely heavy, leading to lightheadedness, dizziness, and cold intolerance. She also reports passing large clots, which has been a consistent issue since the onset of her periods. Despite various trials of birth control methods, including pills, shots, and IUDs, there has been no improvement in the regulation of her periods.  In addition to her menstrual issues, Robyn Williams reports chronic headaches that have been worsening over time. These headaches are often associated with nausea, vomiting, sensitivity to light, and dizziness. She has been managing these headaches with over-the-counter medications, but they are becoming less effective.  Robyn Williams also mentions a mobile, painful mass in her abdomen that can cause discomfort and pain. The location of this mass varies, but it is often in the middle or lower part of her abdomen. This mass has been present for some time and appears to be increasing in frequency.      ROS See pertinent positives and negatives per HPI.     Objective:    BP 112/80 (BP Location: Left Arm)   Pulse 80   Temp (!) 97 F (36.1 C)   Ht 5\' 5"  (1.651 m)   Wt 202 lb 12.8 oz (92 kg)   LMP 04/18/2023  (Exact Date)   SpO2 97%   Breastfeeding No   BMI 33.75 kg/m    Physical Exam Vitals and nursing note reviewed.  Constitutional:      General: She is not in acute distress.    Appearance: Normal appearance.  HENT:     Head: Normocephalic.  Eyes:     Conjunctiva/sclera: Conjunctivae normal.  Cardiovascular:     Rate and Rhythm: Normal rate and regular rhythm.     Pulses: Normal pulses.     Heart sounds: Normal heart sounds.  Pulmonary:     Effort: Pulmonary effort is normal.     Breath sounds: Normal breath sounds.  Abdominal:     Palpations: Abdomen is soft. There is no mass.     Tenderness: There is no abdominal tenderness. There is no guarding.  Musculoskeletal:     Cervical back: Normal range of motion.  Skin:    General: Skin is warm.  Neurological:     General: No focal deficit present.     Mental Status: She is alert and oriented to person, place, and time.  Psychiatric:        Mood and Affect: Mood normal.        Behavior: Behavior normal.        Thought Content: Thought content normal.        Judgment: Judgment normal.      Assessment & Plan:   Problem List Items Addressed This Visit  Cardiovascular and Mediastinum   Chronic migraine without aura without status migrainosus, not intractable    Chronic, ongoing. She experiences daily headaches accompanied by nausea, vomiting, photophobia, and dizziness, indicating migraines. She frequently uses over-the-counter pain medications. We discussed the potential for rebound headaches from frequent medication use and suggested a trial of discontinuing these medications. A trial of magnesium supplements for headache prevention is suggested. If symptoms persist or worsen, we will consider prescribing migraine medication for as-needed use.       Relevant Medications   IBUPROFEN PO   Aspirin-Acetaminophen-Caffeine (GOODYS EXTRA STRENGTH PO)     Other   Menorrhagia with regular cycle - Primary    Chronic, not  controlled. She has a longstanding history of irregular, heavy periods with a diagnosis of endometriosis and has not used birth control recently. She has not consulted a GYN since her last pregnancy in 2021. We will order a pelvic ultrasound to look for fibroids or other structural abnormalities. Thyroid function, blood counts, and iron levels will be checked to evaluate for anemia. She will be referred to Gynecology for further evaluation and management.  She mainly wants to make sure there are no underlying health issues.       Relevant Orders   CBC with Differential/Platelet   Comprehensive metabolic panel   Iron, TIBC and Ferritin Panel   TSH   US Pelvis Complete    No orders of the defined types were placed in this encounter.   Return if symptoms worsen or fail to improve.  Gerre Scull, NP

## 2023-04-26 NOTE — Telephone Encounter (Signed)
I called and spoke with Joyce Gross at [facility] and notified her that patient needs a transvaginal ultrasound. She said that she will update the order.

## 2023-04-26 NOTE — Assessment & Plan Note (Addendum)
Chronic, not controlled. She has a longstanding history of irregular, heavy periods with a diagnosis of endometriosis and has not used birth control recently. She has not consulted a GYN since her last pregnancy in 2021. We will order a pelvic ultrasound to look for fibroids or other structural abnormalities. Thyroid function, blood counts, and iron levels will be checked to evaluate for anemia. She will be referred to Gynecology for further evaluation and management.  She mainly wants to make sure there are no underlying health issues.

## 2023-04-26 NOTE — Patient Instructions (Signed)
It was great to see you!  We are checking your labs today and will let you know the results via mychart/phone.   I have ordered an ultrasound of your uterus.   I recommend scheduling an appointment with GYN.   Let's follow-up based on results  Take care,  Rodman Pickle, NP

## 2023-04-26 NOTE — Assessment & Plan Note (Addendum)
Chronic, ongoing. She experiences daily headaches accompanied by nausea, vomiting, photophobia, and dizziness, indicating migraines. She frequently uses over-the-counter pain medications. We discussed the potential for rebound headaches from frequent medication use and suggested a trial of discontinuing these medications. A trial of magnesium supplements for headache prevention is suggested. If symptoms persist or worsen, we will consider prescribing migraine medication for as-needed use.

## 2023-04-26 NOTE — Telephone Encounter (Signed)
04/26/23 Jae Dire from [facility] called wanting to speak with the nurse per pt. She wants clarification on ultrasound, if it's or without transvaginal. Call back # [phone] (pick opt 1 for sch - opt 5 for ultrasound).

## 2023-04-27 ENCOUNTER — Other Ambulatory Visit (INDEPENDENT_AMBULATORY_CARE_PROVIDER_SITE_OTHER): Payer: BC Managed Care – PPO

## 2023-04-27 DIAGNOSIS — D7589 Other specified diseases of blood and blood-forming organs: Secondary | ICD-10-CM

## 2023-04-27 LAB — IRON,TIBC AND FERRITIN PANEL
%SAT: 28 % (ref 16–45)
Ferritin: 22 ng/mL (ref 16–154)
Iron: 99 ug/dL (ref 40–190)
TIBC: 355 ug/dL (ref 250–450)

## 2023-04-27 LAB — VITAMIN B12: Vitamin B-12: 258 pg/mL (ref 211–911)

## 2023-04-30 ENCOUNTER — Ambulatory Visit
Admission: RE | Admit: 2023-04-30 | Discharge: 2023-04-30 | Disposition: A | Discharge status: Home / Self Care | Payer: BC Managed Care – PPO | Source: Ambulatory Visit | Attending: Nurse Practitioner | Admitting: Nurse Practitioner

## 2023-04-30 DIAGNOSIS — Z419 Encounter for procedure for purposes other than remedying health state, unspecified: Secondary | ICD-10-CM | POA: Diagnosis not present

## 2023-04-30 DIAGNOSIS — N920 Excessive and frequent menstruation with regular cycle: Secondary | ICD-10-CM

## 2023-05-30 DIAGNOSIS — Z419 Encounter for procedure for purposes other than remedying health state, unspecified: Secondary | ICD-10-CM | POA: Diagnosis not present

## 2023-06-30 DIAGNOSIS — Z419 Encounter for procedure for purposes other than remedying health state, unspecified: Secondary | ICD-10-CM | POA: Diagnosis not present

## 2023-07-31 DIAGNOSIS — Z419 Encounter for procedure for purposes other than remedying health state, unspecified: Secondary | ICD-10-CM | POA: Diagnosis not present

## 2023-08-28 DIAGNOSIS — Z419 Encounter for procedure for purposes other than remedying health state, unspecified: Secondary | ICD-10-CM | POA: Diagnosis not present

## 2023-09-03 DIAGNOSIS — K529 Noninfective gastroenteritis and colitis, unspecified: Secondary | ICD-10-CM | POA: Diagnosis not present

## 2023-09-03 DIAGNOSIS — Z87891 Personal history of nicotine dependence: Secondary | ICD-10-CM | POA: Diagnosis not present

## 2023-09-03 DIAGNOSIS — R112 Nausea with vomiting, unspecified: Secondary | ICD-10-CM | POA: Diagnosis not present

## 2023-10-09 DIAGNOSIS — Z419 Encounter for procedure for purposes other than remedying health state, unspecified: Secondary | ICD-10-CM | POA: Diagnosis not present

## 2023-11-08 DIAGNOSIS — Z419 Encounter for procedure for purposes other than remedying health state, unspecified: Secondary | ICD-10-CM | POA: Diagnosis not present

## 2023-12-09 DIAGNOSIS — Z419 Encounter for procedure for purposes other than remedying health state, unspecified: Secondary | ICD-10-CM | POA: Diagnosis not present

## 2024-01-08 DIAGNOSIS — Z419 Encounter for procedure for purposes other than remedying health state, unspecified: Secondary | ICD-10-CM | POA: Diagnosis not present

## 2024-02-08 DIAGNOSIS — Z419 Encounter for procedure for purposes other than remedying health state, unspecified: Secondary | ICD-10-CM | POA: Diagnosis not present

## 2024-03-10 DIAGNOSIS — Z419 Encounter for procedure for purposes other than remedying health state, unspecified: Secondary | ICD-10-CM | POA: Diagnosis not present

## 2024-06-09 DIAGNOSIS — Z419 Encounter for procedure for purposes other than remedying health state, unspecified: Secondary | ICD-10-CM | POA: Diagnosis not present
